# Patient Record
Sex: Male | Born: 1975 | Race: White | Hispanic: No | Marital: Single | Smoking: Former smoker
Health system: Southern US, Community
[De-identification: ages and names within clinical notes are randomized; demographics above are authoritative.]

## PROBLEM LIST (undated history)

## (undated) DIAGNOSIS — F909 Attention-deficit hyperactivity disorder, unspecified type: Secondary | ICD-10-CM

## (undated) DIAGNOSIS — F209 Schizophrenia, unspecified: Secondary | ICD-10-CM

## (undated) DIAGNOSIS — F319 Bipolar disorder, unspecified: Secondary | ICD-10-CM

---

## 1998-10-15 ENCOUNTER — Encounter: Payer: Self-pay | Admitting: Emergency Medicine

## 1998-10-15 ENCOUNTER — Emergency Department (HOSPITAL_COMMUNITY): Admission: EM | Admit: 1998-10-15 | Discharge: 1998-10-15 | Payer: Self-pay | Admitting: Emergency Medicine

## 1999-06-18 ENCOUNTER — Encounter: Payer: Self-pay | Admitting: Emergency Medicine

## 1999-06-18 ENCOUNTER — Emergency Department (HOSPITAL_COMMUNITY): Admission: EM | Admit: 1999-06-18 | Discharge: 1999-06-18 | Payer: Self-pay | Admitting: Emergency Medicine

## 1999-09-11 ENCOUNTER — Encounter: Payer: Self-pay | Admitting: Emergency Medicine

## 1999-09-11 ENCOUNTER — Emergency Department (HOSPITAL_COMMUNITY): Admission: EM | Admit: 1999-09-11 | Discharge: 1999-09-11 | Payer: Self-pay | Admitting: Emergency Medicine

## 1999-10-28 ENCOUNTER — Emergency Department (HOSPITAL_COMMUNITY): Admission: EM | Admit: 1999-10-28 | Discharge: 1999-10-28 | Payer: Self-pay | Admitting: Internal Medicine

## 2002-03-24 ENCOUNTER — Emergency Department (HOSPITAL_COMMUNITY): Admission: EM | Admit: 2002-03-24 | Discharge: 2002-03-24 | Payer: Self-pay | Admitting: Emergency Medicine

## 2002-07-05 ENCOUNTER — Emergency Department (HOSPITAL_COMMUNITY): Admission: EM | Admit: 2002-07-05 | Discharge: 2002-07-05 | Payer: Self-pay | Admitting: *Deleted

## 2002-09-02 ENCOUNTER — Emergency Department (HOSPITAL_COMMUNITY): Admission: EM | Admit: 2002-09-02 | Discharge: 2002-09-02 | Payer: Self-pay | Admitting: Emergency Medicine

## 2002-11-19 ENCOUNTER — Encounter: Payer: Self-pay | Admitting: General Surgery

## 2002-11-19 ENCOUNTER — Encounter: Payer: Self-pay | Admitting: Emergency Medicine

## 2002-11-20 ENCOUNTER — Inpatient Hospital Stay (HOSPITAL_COMMUNITY): Admission: AC | Admit: 2002-11-20 | Discharge: 2002-11-24 | Payer: Self-pay

## 2002-11-23 ENCOUNTER — Encounter: Payer: Self-pay | Admitting: General Surgery

## 2002-11-24 ENCOUNTER — Encounter: Payer: Self-pay | Admitting: Orthopedic Surgery

## 2002-11-27 ENCOUNTER — Emergency Department (HOSPITAL_COMMUNITY): Admission: EM | Admit: 2002-11-27 | Discharge: 2002-11-27 | Payer: Self-pay | Admitting: Emergency Medicine

## 2002-12-05 ENCOUNTER — Emergency Department (HOSPITAL_COMMUNITY): Admission: EM | Admit: 2002-12-05 | Discharge: 2002-12-05 | Payer: Self-pay | Admitting: Emergency Medicine

## 2004-03-07 ENCOUNTER — Ambulatory Visit (HOSPITAL_COMMUNITY): Admission: RE | Admit: 2004-03-07 | Discharge: 2004-03-07 | Payer: Self-pay | Admitting: Orthopedic Surgery

## 2005-06-14 ENCOUNTER — Emergency Department (HOSPITAL_COMMUNITY): Admission: EM | Admit: 2005-06-14 | Discharge: 2005-06-14 | Payer: Self-pay | Admitting: Emergency Medicine

## 2005-11-25 ENCOUNTER — Emergency Department (HOSPITAL_COMMUNITY): Admission: EM | Admit: 2005-11-25 | Discharge: 2005-11-25 | Payer: Self-pay | Admitting: Emergency Medicine

## 2011-10-01 ENCOUNTER — Emergency Department (HOSPITAL_COMMUNITY): Payer: Self-pay

## 2011-10-01 ENCOUNTER — Emergency Department (HOSPITAL_COMMUNITY)
Admission: EM | Admit: 2011-10-01 | Discharge: 2011-10-01 | Disposition: A | Discharge status: Home / Self Care | Payer: Self-pay | Attending: Emergency Medicine | Admitting: Emergency Medicine

## 2011-10-01 DIAGNOSIS — M25539 Pain in unspecified wrist: Secondary | ICD-10-CM | POA: Insufficient documentation

## 2011-10-01 DIAGNOSIS — S51809A Unspecified open wound of unspecified forearm, initial encounter: Secondary | ICD-10-CM | POA: Insufficient documentation

## 2011-10-01 DIAGNOSIS — S63509A Unspecified sprain of unspecified wrist, initial encounter: Secondary | ICD-10-CM | POA: Insufficient documentation

## 2011-10-01 DIAGNOSIS — S0003XA Contusion of scalp, initial encounter: Secondary | ICD-10-CM | POA: Insufficient documentation

## 2011-10-01 DIAGNOSIS — IMO0002 Reserved for concepts with insufficient information to code with codable children: Secondary | ICD-10-CM | POA: Insufficient documentation

## 2012-10-15 ENCOUNTER — Emergency Department (HOSPITAL_COMMUNITY)
Admission: EM | Admit: 2012-10-15 | Discharge: 2012-10-16 | Disposition: A | Discharge status: Home / Self Care | Payer: Self-pay | Attending: Emergency Medicine | Admitting: Emergency Medicine

## 2012-10-15 ENCOUNTER — Encounter (HOSPITAL_COMMUNITY): Payer: Self-pay | Admitting: *Deleted

## 2012-10-15 ENCOUNTER — Emergency Department (HOSPITAL_COMMUNITY): Payer: Self-pay

## 2012-10-15 DIAGNOSIS — R51 Headache: Secondary | ICD-10-CM | POA: Insufficient documentation

## 2012-10-15 DIAGNOSIS — R5381 Other malaise: Secondary | ICD-10-CM | POA: Insufficient documentation

## 2012-10-15 DIAGNOSIS — R5383 Other fatigue: Secondary | ICD-10-CM | POA: Insufficient documentation

## 2012-10-15 DIAGNOSIS — F17200 Nicotine dependence, unspecified, uncomplicated: Secondary | ICD-10-CM | POA: Insufficient documentation

## 2012-10-15 DIAGNOSIS — R079 Chest pain, unspecified: Secondary | ICD-10-CM | POA: Insufficient documentation

## 2012-10-15 DIAGNOSIS — R0602 Shortness of breath: Secondary | ICD-10-CM | POA: Insufficient documentation

## 2012-10-15 MED ORDER — IBUPROFEN 800 MG PO TABS
800.0000 mg | ORAL_TABLET | Freq: Once | ORAL | Status: DC
  Filled 2012-10-15: qty 1

## 2012-10-15 NOTE — ED Notes (Signed)
Pt states he no longer has a HA and does not want ibuprofen. He is also questioning the blood draw and wants to speak with the MD. Stated that we are "not checking him for mold like he asked." Will notify MD.

## 2012-10-15 NOTE — ED Notes (Signed)
Bedside report received from previous RN. Pt resting quietly. Will continue to monitor. 

## 2012-10-15 NOTE — ED Provider Notes (Signed)
History     CSN: [id number]  Arrival date & time 10/15/12  1478   First MD Initiated Contact with Patient 10/15/12 2135      Chief Complaint  Patient presents with  . Headache   HPI  History provided by the patient. Patient is a 36 year old male with no significant PMH who presents with multiple complaints of headache, generalized fatigue and concerns for mold and fungus exposure. Patient states that his current house has mold throughout the house and has been tested for this. He states that he has been feeling increased fatigue for several months with waxing and waning headaches. Patient has been becoming concerned of possible old infection. He also complains of some occasional shortness of breath and pain with inspirations. He denies any cough, fever, chills or sweats. Patient denies any abdominal pain or discomfort. Denies any nausea vomiting or diarrhea. Denies any urinary complaints. He denies any rhinorrhea or nasal congestion. Symptoms have been present for the past 2 years.    History reviewed. No pertinent past medical history.  History reviewed. No pertinent past surgical history.  History reviewed. No pertinent family history.  History  Substance Use Topics  . Smoking status: Current Some Day Smoker  . Smokeless tobacco: Not on file  . Alcohol Use: Yes      Review of Systems  Constitutional: Positive for fatigue. Negative for fever, chills, appetite change and unexpected weight change.  Respiratory: Positive for shortness of breath. Negative for cough and wheezing.   Cardiovascular: Positive for chest pain.  Gastrointestinal: Negative for nausea, vomiting, abdominal pain and diarrhea.  Genitourinary: Negative for dysuria, frequency, hematuria and flank pain.  Neurological: Positive for headaches.    Allergies  Review of patient's allergies indicates not on file.  Home Medications  No current outpatient prescriptions on file.  BP 125/77  Pulse 59  Temp 98.5  F (36.9 C) (Oral)  Resp 20  SpO2 99%  Physical Exam  Nursing note and vitals reviewed. Constitutional: He is oriented to person, place, and time. He appears well-developed and well-nourished. No distress.  HENT:  Head: Normocephalic.  Mouth/Throat: Oropharynx is clear and moist.  Neck: Normal range of motion. Neck supple.       No meningeal signs  Cardiovascular: Normal rate and regular rhythm.   Pulmonary/Chest: Effort normal and breath sounds normal. No respiratory distress. He has no wheezes. He has no rales.  Abdominal: Soft.  Musculoskeletal: He exhibits no edema.  Lymphadenopathy:    He has no cervical adenopathy.  Neurological: He is alert and oriented to person, place, and time.  Skin: Skin is warm. No rash noted. No erythema.  Psychiatric: He has a normal mood and affect. His behavior is normal.    ED Course  Procedures   Results for orders placed during the hospital encounter of 10/15/12  CBC WITH DIFFERENTIAL      Component Value Range   WBC 8.4  4.0 - 10.5 K/uL   RBC 5.07  4.22 - 5.81 MIL/uL   Hemoglobin 14.2  13.0 - 17.0 g/dL   HCT 29.5  62.1 - 30.8 %   MCV 83.4  78.0 - 100.0 fL   MCH 28.0  26.0 - 34.0 pg   MCHC 33.6  30.0 - 36.0 g/dL   RDW 65.7  84.6 - 96.2 %   Platelets 175  150 - 400 K/uL   Neutrophils Relative 51  43 - 77 %   Neutro Abs 4.3  1.7 - 7.7 K/uL  Lymphocytes Relative 39  12 - 46 %   Lymphs Abs 3.3  0.7 - 4.0 K/uL   Monocytes Relative 8  3 - 12 %   Monocytes Absolute 0.7  0.1 - 1.0 K/uL   Eosinophils Relative 2  0 - 5 %   Eosinophils Absolute 0.2  0.0 - 0.7 K/uL   Basophils Relative 1  0 - 1 %   Basophils Absolute 0.0  0.0 - 0.1 K/uL      Dg Chest 2 View  10/15/2012  *RADIOLOGY REPORT*  Clinical Data: Cough, shortness of breath.  CHEST - 2 VIEW  Comparison: None.  Findings: Cardiomediastinal silhouette appears normal.  No acute pulmonary disease is noted.  Bony thorax is intact.  IMPRESSION: No acute cardiopulmonary abnormality seen.    Original Report Authenticated By: Lupita Raider.,  M.D.      1. Headache       MDM  10:50 PM patient seen and evaluated. Patient appears calm and in no acute distress.   Patient is well-appearing with normal vital signs and normal temperature. His chest x-ray does not show any concerning findings of infection. His blood tests are also normal today. At this time patient does not appear to have any emergent condition and will be discharged home to followup with the primary care provider.     Angus Seller, [place] 10/17/12 [phone]

## 2012-10-15 NOTE — ED Notes (Signed)
Patient is alert and oriented x3.  He is here for chronic issues that he states that he has been dealing with. He has possible been exposed to mold spores in his house.  His S/S are vision problems, headaches,  Tired, fatigue.  He also adds he does have a sharp pain when he takes a deep breath but no other complaints.

## 2012-10-15 NOTE — ED Notes (Signed)
Pt is c/o headache, blurred vision, "dizzy spells", fatigue. Pt sts he had this symptoms for about 2 years now. Pt also sts his house was tested for mold and "air tested positive for mold spores". Pt sts "I just want you to test my blood for colonies, because I read on the internet that mold spores can colonize in your body". Pt also c/o memory impairment.

## 2012-10-16 LAB — CBC WITH DIFFERENTIAL/PLATELET
Eosinophils Absolute: 0.2 10*3/uL (ref 0.0–0.7)
Hemoglobin: 14.2 g/dL (ref 13.0–17.0)
Lymphocytes Relative: 39 % (ref 12–46)
Lymphs Abs: 3.3 10*3/uL (ref 0.7–4.0)
MCH: 28 pg (ref 26.0–34.0)
Monocytes Relative: 8 % (ref 3–12)
Neutro Abs: 4.3 10*3/uL (ref 1.7–7.7)
Neutrophils Relative %: 51 % (ref 43–77)
Platelets: 175 10*3/uL (ref 150–400)
RBC: 5.07 MIL/uL (ref 4.22–5.81)
WBC: 8.4 10*3/uL (ref 4.0–10.5)

## 2012-10-16 NOTE — ED Notes (Signed)
Pt spoke with MD and has agreed to do the blood test.

## 2012-10-16 NOTE — ED Notes (Signed)
Spoke with MD and went back to explain the necessity of the basic CBC test before running a more expensive fungal panel. The pt is focused on cost since he does not have insurance and believes that the CBC is a waste of money since he knows he is sick because of the mold in his house. Pt is refusing the CBC.

## 2012-10-17 NOTE — ED Provider Notes (Signed)
Medical screening examination/treatment/procedure(s) were performed by non-physician practitioner and as supervising physician I was immediately available for consultation/collaboration.   Mai Longnecker M Beckett Maden, DO 10/17/12 2019 

## 2015-11-01 ENCOUNTER — Emergency Department (HOSPITAL_COMMUNITY): Payer: Self-pay

## 2015-11-01 ENCOUNTER — Emergency Department (HOSPITAL_COMMUNITY)
Admission: EM | Admit: 2015-11-01 | Discharge: 2015-11-03 | Disposition: A | Discharge status: Other Acute Inpatient Hospital | Payer: Self-pay | Attending: Emergency Medicine | Admitting: Emergency Medicine

## 2015-11-01 ENCOUNTER — Encounter (HOSPITAL_COMMUNITY): Payer: Self-pay

## 2015-11-01 ENCOUNTER — Encounter (HOSPITAL_COMMUNITY): Payer: Self-pay | Admitting: Emergency Medicine

## 2015-11-01 DIAGNOSIS — Z79899 Other long term (current) drug therapy: Secondary | ICD-10-CM | POA: Insufficient documentation

## 2015-11-01 DIAGNOSIS — F29 Unspecified psychosis not due to a substance or known physiological condition: Secondary | ICD-10-CM | POA: Diagnosis present

## 2015-11-01 DIAGNOSIS — Y9289 Other specified places as the place of occurrence of the external cause: Secondary | ICD-10-CM | POA: Insufficient documentation

## 2015-11-01 DIAGNOSIS — Y9389 Activity, other specified: Secondary | ICD-10-CM | POA: Insufficient documentation

## 2015-11-01 DIAGNOSIS — F22 Delusional disorders: Secondary | ICD-10-CM | POA: Insufficient documentation

## 2015-11-01 DIAGNOSIS — S0012XA Contusion of left eyelid and periocular area, initial encounter: Secondary | ICD-10-CM | POA: Insufficient documentation

## 2015-11-01 DIAGNOSIS — F17200 Nicotine dependence, unspecified, uncomplicated: Secondary | ICD-10-CM | POA: Insufficient documentation

## 2015-11-01 DIAGNOSIS — X58XXXA Exposure to other specified factors, initial encounter: Secondary | ICD-10-CM | POA: Insufficient documentation

## 2015-11-01 DIAGNOSIS — S0011XA Contusion of right eyelid and periocular area, initial encounter: Secondary | ICD-10-CM | POA: Insufficient documentation

## 2015-11-01 DIAGNOSIS — F1510 Other stimulant abuse, uncomplicated: Secondary | ICD-10-CM | POA: Insufficient documentation

## 2015-11-01 DIAGNOSIS — Y998 Other external cause status: Secondary | ICD-10-CM | POA: Insufficient documentation

## 2015-11-01 DIAGNOSIS — R4182 Altered mental status, unspecified: Secondary | ICD-10-CM | POA: Insufficient documentation

## 2015-11-01 LAB — COMPREHENSIVE METABOLIC PANEL
ALBUMIN: 4.6 g/dL (ref 3.5–5.0)
ALT: 23 U/L (ref 17–63)
AST: 18 U/L (ref 15–41)
Alkaline Phosphatase: 68 U/L (ref 38–126)
Anion gap: 7 (ref 5–15)
BILIRUBIN TOTAL: 0.5 mg/dL (ref 0.3–1.2)
BUN: 16 mg/dL (ref 6–20)
CHLORIDE: 107 mmol/L (ref 101–111)
CO2: 23 mmol/L (ref 22–32)
CREATININE: 0.64 mg/dL (ref 0.61–1.24)
Calcium: 9.1 mg/dL (ref 8.9–10.3)
GFR calc Af Amer: >60 mL/min (ref >60)
GLUCOSE: 101 mg/dL — AB (ref 65–99)
POTASSIUM: 4.2 mmol/L (ref 3.5–5.1)
SODIUM: 137 mmol/L (ref 135–145)
TOTAL PROTEIN: 7.8 g/dL (ref 6.5–8.1)

## 2015-11-01 LAB — CBC
HEMATOCRIT: 44.4 % (ref 39.0–52.0)
Hemoglobin: 15.2 g/dL (ref 13.0–17.0)
MCH: 28.3 pg (ref 26.0–34.0)
MCHC: 34.2 g/dL (ref 30.0–36.0)
MCV: 82.7 fL (ref 78.0–100.0)
Platelets: 199 10*3/uL (ref 150–400)
RBC: 5.37 MIL/uL (ref 4.22–5.81)
RDW: 13.4 % (ref 11.5–15.5)
WBC: 8.6 10*3/uL (ref 4.0–10.5)

## 2015-11-01 LAB — RAPID URINE DRUG SCREEN, HOSP PERFORMED
AMPHETAMINES: POSITIVE — AB
BARBITURATES: NOT DETECTED
BENZODIAZEPINES: NOT DETECTED
Cocaine: NOT DETECTED
Opiates: NOT DETECTED
Tetrahydrocannabinol: NOT DETECTED

## 2015-11-01 LAB — ACETAMINOPHEN LEVEL: Acetaminophen (Tylenol), Serum: <10 ug/mL — ABNORMAL LOW (ref 10–30)

## 2015-11-01 LAB — SALICYLATE LEVEL: Salicylate Lvl: <4 mg/dL (ref 2.8–30.0)

## 2015-11-01 LAB — ETHANOL: Alcohol, Ethyl (B): <5 mg/dL (ref <5)

## 2015-11-01 MED ORDER — RISPERIDONE 2 MG PO TBDP
2.0000 mg | ORAL_TABLET | Freq: Every day | ORAL | Status: DC
Start: 2015-11-01 — End: 2015-11-03
  Administered 2015-11-01 – 2015-11-02 (×2): 2 mg via ORAL
  Filled 2015-11-01 (×3): qty 1

## 2015-11-01 MED ORDER — ZIPRASIDONE MESYLATE 20 MG IM SOLR: 10.0000 mg | Freq: Three times a day (TID) | INTRAMUSCULAR | Status: DC | PRN

## 2015-11-01 MED ORDER — STERILE WATER FOR INJECTION IJ SOLN
INTRAMUSCULAR | Status: AC
  Administered 2015-11-01: 1.2 mL
  Filled 2015-11-01: qty 10

## 2015-11-01 MED ORDER — ZIPRASIDONE MESYLATE 20 MG IM SOLR
INTRAMUSCULAR | Status: AC
  Administered 2015-11-01: 10 mg via INTRAMUSCULAR
  Filled 2015-11-01: qty 20

## 2015-11-01 MED ORDER — LORAZEPAM 1 MG PO TABS
2.0000 mg | ORAL_TABLET | Freq: Once | ORAL | Status: AC
  Administered 2015-11-01: 2 mg via ORAL
  Filled 2015-11-01: qty 2

## 2015-11-01 MED ORDER — FINASTERIDE 5 MG PO TABS
5.0000 mg | ORAL_TABLET | Freq: Every day | ORAL | Status: DC
Start: 2015-11-01 — End: 2015-11-03
  Filled 2015-11-01 (×3): qty 1

## 2015-11-01 MED ORDER — ZIPRASIDONE MESYLATE 20 MG IM SOLR
10.0000 mg | Freq: Once | INTRAMUSCULAR | Status: AC
  Administered 2015-11-01: 10 mg via INTRAMUSCULAR

## 2015-11-01 MED ORDER — ZIPRASIDONE MESYLATE 20 MG IM SOLR: 10.0000 mg | Freq: Once | INTRAMUSCULAR | Status: DC

## 2015-11-01 NOTE — ED Notes (Signed)
A man called, identifying himself as the patients father. He states the patient has a history of schizophrenia. The man demanded to be given information about the patient and to speak to the physician involved in his care. I advised I can not disclose information about the patient over the phone due to violation of HIPPA. The man then states, "I will just have to come up there and sit all day then." I advised the patient's father he is welcome to come to ER and I will relay his request to speak with the provider to the oncoming staff.

## 2015-11-01 NOTE — ED Notes (Signed)
[facility] Expected date:  Expected time:  Means of arrival:  Comments: Police, psyche

## 2015-11-01 NOTE — ED Notes (Signed)
Patient arrives with [employer], patient is under IVC. Patient is IVC by his mother. Per IVC papers patient is hearing voices. Patient has bilateral black eyes that are self inflicted and has been attempting to break his own jaw. Patient screaming and cursing loudly in hallway. Patient was moved to room 23, order for Geodon obtained.

## 2015-11-01 NOTE — ED Notes (Signed)
Pt made aware that his parents are here by this Consulting civil engineerCharge RN.  Pt is refusing visitors at this time.  Parents made aware by this Consulting civil engineerCharge RN.  Pt's father sts "I need to talk to the doctor that is seeing him.  My son is very smart and manipulative."  This Consulting civil engineerCharge RN informed them that I would let the provider know that they are in the lobby, if they need further information.

## 2015-11-01 NOTE — ED Notes (Signed)
Lab at bedside

## 2015-11-01 NOTE — ED Notes (Signed)
Dr. Estell HarpinZammit speaking with pt's parents at this time.

## 2015-11-01 NOTE — ED Notes (Signed)
[name], pt's father, would like to speak to counselor/doctor for assessment. Phone # [phone]

## 2015-11-01 NOTE — ED Notes (Signed)
Brad Barr is restless, anxious and agitated. He is yelling out periodically with no one in his room but himself, he has broken the  door off of the safety /medical equipment cabinet in his room. He then denies breaking it and states it came off track. He denies AVH. He is refusing to be given IM geodon as ordered for agitation. Agreeable to PO alternative instead. He states " you are not putting a needle in my ass, that's not gonna happen! I will take a pill but not a needle". On call provider notified.

## 2015-11-01 NOTE — ED Provider Notes (Signed)
CSN: [id number]     Arrival date & time 11/01/15  1046 History   First MD Initiated Contact with Patient 11/01/15 1123     Chief Complaint  Patient presents with  . IVC   . Hallucinations     (Consider location/radiation/quality/duration/timing/severity/associated sxs/prior Treatment) Patient is a 39 y.o. male presenting with altered mental status. The history is provided by a relative (Patient was committed by family because he was having delusions that the [employer] was using [employer] to get to him. His family stated that he destroyed his house.).  Altered Mental Status Presenting symptoms: behavior changes   Severity:  Severe Most recent episode:  More than 2 days ago Episode history:  Multiple Timing:  Constant Progression:  Worsening Chronicity:  New Context: not alcohol use     History reviewed. No pertinent past medical history. History reviewed. No pertinent past surgical history. History reviewed. No pertinent family history. Social History  Substance Use Topics  . Smoking status: Current Some Day Smoker  . Smokeless tobacco: None  . Alcohol Use: Yes    Review of Systems  Unable to perform ROS: Mental status change      Allergies  Review of patient's allergies indicates no known allergies.  Home Medications   Prior to Admission medications   Medication Sig Start Date End Date Taking? Authorizing Provider  amphetamine-dextroamphetamine (ADDERALL) 30 MG tablet Take 30 mg by mouth 3 (three) times daily.   Yes Historical Provider, MD  finasteride (PROPECIA) 1 MG tablet Take 0.25 mg by mouth daily.   Yes Historical Provider, MD  ibuprofen (ADVIL,MOTRIN) 200 MG tablet Take 200 mg by mouth every 6 (six) hours as needed.   Yes Historical Provider, MD  finasteride (PROSCAR) 5 MG tablet Take 5 mg by mouth daily.    Historical Provider, MD   BP 121/86 mmHg  Pulse 63  Temp(Src) 97.8 F (36.6 C) (Oral)  Resp 20  SpO2 97% Physical Exam  Constitutional: He is oriented  to person, place, and time. He appears well-developed.  HENT:  Head: Normocephalic.  Eyes: Conjunctivae and EOM are normal. No scleral icterus.  Neck: Neck supple. No thyromegaly present.  Cardiovascular: Normal rate and regular rhythm.  Exam reveals no gallop and no friction rub.   No murmur heard. Pulmonary/Chest: No stridor. He has no wheezes. He has no rales. He exhibits no tenderness.  Abdominal: He exhibits no distension. There is no tenderness. There is no rebound.  Musculoskeletal: Normal range of motion. He exhibits no edema.  Lymphadenopathy:    He has no cervical adenopathy.  Neurological: He is oriented to person, place, and time. He exhibits normal muscle tone. Coordination normal.  Skin: No rash noted. No erythema.  Psychiatric:  Patient having hallucinations auditory hallucinations and delusional thoughts [facility]    ED Course  Procedures (including critical care time) Labs Review Labs Reviewed  COMPREHENSIVE METABOLIC PANEL - Abnormal; Notable for the following:    Glucose, Bld 101 (*)    All other components within normal limits  ACETAMINOPHEN LEVEL - Abnormal; Notable for the following:    Acetaminophen (Tylenol), Serum <10 (*)    All other components within normal limits  URINE RAPID DRUG SCREEN, HOSP PERFORMED - Abnormal; Notable for the following:    Amphetamines POSITIVE (*)    All other components within normal limits  ETHANOL  SALICYLATE LEVEL  CBC    Imaging Review Ct Head Wo Contrast  11/01/2015  CLINICAL DATA:  Self-inflicted injury with bilateral ecchymoses  surrounding the eyes EXAM: CT HEAD WITHOUT CONTRAST CT MAXILLOFACIAL WITHOUT CONTRAST CT CERVICAL SPINE WITHOUT CONTRAST TECHNIQUE: Multidetector CT imaging of the head, cervical spine, and maxillofacial structures were performed using the standard protocol without intravenous contrast. Multiplanar CT image reconstructions of the cervical spine and maxillofacial structures were also  generated. COMPARISON:  None. FINDINGS: CT HEAD FINDINGS Bony calvarium is intact. No gross soft tissue abnormality is noted. No findings to suggest acute hemorrhage, acute infarction or space-occupying mass lesion are noted. CT MAXILLOFACIAL FINDINGS Bony structures are within normal limits. No acute fracture is seen. Mild soft tissue swelling is noted about the size consistent with the recent injury. The orbits and their contents are within normal limits. A prior nasal bone fracture is noted with incomplete healing. CT CERVICAL SPINE FINDINGS Seven cervical segments are well visualized. Mild osteophytic changes are noted from C2 to C6. No acute fracture or acute facet abnormality is noted. The surrounding soft tissues are within normal limits. The odontoid is unremarkable. IMPRESSION: CT of the head:  No acute abnormality noted. CT of the maxillofacial bones: No acute fracture is noted. A chronic nasal bone fracture is seen. CT of the cervical spine: Degenerative change without acute abnormality. Electronically Signed   By: Alcide Clever M.D.   On: 11/01/2015 12:50   Ct Cervical Spine Wo Contrast  11/01/2015  CLINICAL DATA:  Self-inflicted injury with bilateral ecchymoses surrounding the eyes EXAM: CT HEAD WITHOUT CONTRAST CT MAXILLOFACIAL WITHOUT CONTRAST CT CERVICAL SPINE WITHOUT CONTRAST TECHNIQUE: Multidetector CT imaging of the head, cervical spine, and maxillofacial structures were performed using the standard protocol without intravenous contrast. Multiplanar CT image reconstructions of the cervical spine and maxillofacial structures were also generated. COMPARISON:  None. FINDINGS: CT HEAD FINDINGS Bony calvarium is intact. No gross soft tissue abnormality is noted. No findings to suggest acute hemorrhage, acute infarction or space-occupying mass lesion are noted. CT MAXILLOFACIAL FINDINGS Bony structures are within normal limits. No acute fracture is seen. Mild soft tissue swelling is noted about the  size consistent with the recent injury. The orbits and their contents are within normal limits. A prior nasal bone fracture is noted with incomplete healing. CT CERVICAL SPINE FINDINGS Seven cervical segments are well visualized. Mild osteophytic changes are noted from C2 to C6. No acute fracture or acute facet abnormality is noted. The surrounding soft tissues are within normal limits. The odontoid is unremarkable. IMPRESSION: CT of the head:  No acute abnormality noted. CT of the maxillofacial bones: No acute fracture is noted. A chronic nasal bone fracture is seen. CT of the cervical spine: Degenerative change without acute abnormality. Electronically Signed   By: Alcide Clever M.D.   On: 11/01/2015 12:50   Ct Maxillofacial Wo Cm  11/01/2015  CLINICAL DATA:  Self-inflicted injury with bilateral ecchymoses surrounding the eyes EXAM: CT HEAD WITHOUT CONTRAST CT MAXILLOFACIAL WITHOUT CONTRAST CT CERVICAL SPINE WITHOUT CONTRAST TECHNIQUE: Multidetector CT imaging of the head, cervical spine, and maxillofacial structures were performed using the standard protocol without intravenous contrast. Multiplanar CT image reconstructions of the cervical spine and maxillofacial structures were also generated. COMPARISON:  None. FINDINGS: CT HEAD FINDINGS Bony calvarium is intact. No gross soft tissue abnormality is noted. No findings to suggest acute hemorrhage, acute infarction or space-occupying mass lesion are noted. CT MAXILLOFACIAL FINDINGS Bony structures are within normal limits. No acute fracture is seen. Mild soft tissue swelling is noted about the size consistent with the recent injury. The orbits and their contents are within  normal limits. A prior nasal bone fracture is noted with incomplete healing. CT CERVICAL SPINE FINDINGS Seven cervical segments are well visualized. Mild osteophytic changes are noted from C2 to C6. No acute fracture or acute facet abnormality is noted. The surrounding soft tissues are within  normal limits. The odontoid is unremarkable. IMPRESSION: CT of the head:  No acute abnormality noted. CT of the maxillofacial bones: No acute fracture is noted. A chronic nasal bone fracture is seen. CT of the cervical spine: Degenerative change without acute abnormality. Electronically Signed   By: Alcide Clever M.D.   On: 11/01/2015 12:50   I have personally reviewed and evaluated these images and lab results as part of my medical decision-making.   EKG Interpretation None      MDM   Final diagnoses:  None    [facility]    [name], MD 11/01/15 [phone]
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
## 2015-11-01 NOTE — ED Notes (Signed)
Patient not willing to speak to staff at this time to complete assessment.

## 2015-11-01 NOTE — ED Notes (Signed)
Brad Barr is resting in his room. He denies pain at this time. He is not receptive to answering most of my assessment questions. His answer to my questions is "I am trying to sleep". Informed patient that I was his night RN and I am available to him for any questions, concerns or medical needs tonight. He responds "ok".

## 2015-11-01 NOTE — BH Assessment (Signed)
Per Dr. Almyra FreeAkintayo & Conrad, DNP patient meets criteria for a inpatient admission. [facility] No beds at this time. Patient to be referred to outside facilities. Patient Under Review: [facility], [facility], Brad Barr, Barr, [facility], [facility]

## 2015-11-02 DIAGNOSIS — F29 Unspecified psychosis not due to a substance or known physiological condition: Secondary | ICD-10-CM

## 2015-11-02 MED ORDER — LORAZEPAM 1 MG PO TABS: 1.0000 mg | ORAL_TABLET | Freq: Three times a day (TID) | ORAL | Status: DC | PRN

## 2015-11-02 MED ORDER — DIPHENHYDRAMINE HCL 25 MG PO CAPS
50.0000 mg | ORAL_CAPSULE | Freq: Three times a day (TID) | ORAL | Status: DC | PRN
  Administered 2015-11-02: 50 mg via ORAL
  Filled 2015-11-02: qty 2

## 2015-11-02 MED ORDER — HYDROXYZINE HCL 25 MG PO TABS: 50.0000 mg | ORAL_TABLET | Freq: Three times a day (TID) | ORAL | Status: DC | PRN

## 2015-11-02 MED ORDER — HYDROCORTISONE 1 % EX CREA
TOPICAL_CREAM | Freq: Three times a day (TID) | CUTANEOUS | Status: DC
  Administered 2015-11-02 – 2015-11-03 (×2): via TOPICAL
  Filled 2015-11-02: qty 28

## 2015-11-02 MED ORDER — TRAZODONE HCL 50 MG PO TABS
50.0000 mg | ORAL_TABLET | Freq: Every day | ORAL | Status: DC
  Administered 2015-11-02: 50 mg via ORAL
  Filled 2015-11-02: qty 1

## 2015-11-02 NOTE — ED Notes (Signed)
Pt refused vitals 

## 2015-11-02 NOTE — ED Notes (Signed)
F/U attempt to track vascular US order.

## 2015-11-02 NOTE — ED Notes (Signed)
[name] asked slightly irritable when approached but warmed slightly during h.s med administration. He refused vitals. He was in NAD. He denied SI/HI/AVH and other needs. Took meds without difficulty or issue. Urged him to Animal nutritionistapproach writer with needs/concerns. Will continue to monitor for needs/safety.
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
## 2015-11-02 NOTE — ED Notes (Signed)
EDP contacted to assess rash on forearms.

## 2015-11-02 NOTE — ED Notes (Signed)
Attempted conversation with patient.  Responds "I'm ok".  Food/fluids offered.  No physical complaints.  Unable to complete psych assessment.

## 2015-11-03 ENCOUNTER — Emergency Department (HOSPITAL_COMMUNITY): Payer: Self-pay

## 2015-11-03 ENCOUNTER — Inpatient Hospital Stay (HOSPITAL_COMMUNITY)
Admission: AD | Admit: 2015-11-03 | Discharge: 2015-11-08 | DRG: 897 | Disposition: A | Discharge status: Home / Self Care | Payer: Federal, State, Local not specified - Other | Source: Intra-hospital | Attending: Psychiatry | Admitting: Psychiatry

## 2015-11-03 ENCOUNTER — Encounter (HOSPITAL_COMMUNITY): Payer: Self-pay

## 2015-11-03 DIAGNOSIS — F17200 Nicotine dependence, unspecified, uncomplicated: Secondary | ICD-10-CM | POA: Diagnosis present

## 2015-11-03 DIAGNOSIS — F900 Attention-deficit hyperactivity disorder, predominantly inattentive type: Secondary | ICD-10-CM | POA: Diagnosis not present

## 2015-11-03 DIAGNOSIS — F1310 Sedative, hypnotic or anxiolytic abuse, uncomplicated: Secondary | ICD-10-CM | POA: Diagnosis present

## 2015-11-03 DIAGNOSIS — F19950 Other psychoactive substance use, unspecified with psychoactive substance-induced psychotic disorder with delusions: Secondary | ICD-10-CM | POA: Diagnosis not present

## 2015-11-03 DIAGNOSIS — F1590 Other stimulant use, unspecified, uncomplicated: Secondary | ICD-10-CM | POA: Diagnosis not present

## 2015-11-03 DIAGNOSIS — F909 Attention-deficit hyperactivity disorder, unspecified type: Secondary | ICD-10-CM | POA: Diagnosis present

## 2015-11-03 DIAGNOSIS — F411 Generalized anxiety disorder: Secondary | ICD-10-CM | POA: Diagnosis present

## 2015-11-03 DIAGNOSIS — F29 Unspecified psychosis not due to a substance or known physiological condition: Secondary | ICD-10-CM | POA: Diagnosis present

## 2015-11-03 MED ORDER — HYDROXYZINE HCL 50 MG PO TABS
50.0000 mg | ORAL_TABLET | Freq: Three times a day (TID) | ORAL | Status: DC | PRN
  Administered 2015-11-03 – 2015-11-04 (×2): 50 mg via ORAL
  Filled 2015-11-03 (×2): qty 1

## 2015-11-03 MED ORDER — HYDROCORTISONE 1 % EX CREA
TOPICAL_CREAM | Freq: Three times a day (TID) | CUTANEOUS | Status: DC
  Administered 2015-11-03 – 2015-11-04 (×3): via TOPICAL
  Administered 2015-11-04: 1 via TOPICAL
  Filled 2015-11-03 (×2): qty 28

## 2015-11-03 MED ORDER — ACETAMINOPHEN 325 MG PO TABS
650.0000 mg | ORAL_TABLET | Freq: Four times a day (QID) | ORAL | Status: DC | PRN
  Administered 2015-11-05 – 2015-11-06 (×2): 650 mg via ORAL
  Filled 2015-11-03 (×2): qty 2

## 2015-11-03 MED ORDER — DIPHENHYDRAMINE HCL 25 MG PO CAPS: 50.0000 mg | ORAL_CAPSULE | Freq: Three times a day (TID) | ORAL | Status: DC | PRN

## 2015-11-03 MED ORDER — BACITRACIN-NEOMYCIN-POLYMYXIN OINTMENT TUBE
TOPICAL_OINTMENT | Freq: Two times a day (BID) | CUTANEOUS | Status: DC
  Administered 2015-11-03 – 2015-11-04 (×3): via TOPICAL
  Filled 2015-11-03: qty 1
  Filled 2015-11-03: qty 15

## 2015-11-03 MED ORDER — LORAZEPAM 1 MG PO TABS
1.0000 mg | ORAL_TABLET | Freq: Three times a day (TID) | ORAL | Status: DC | PRN
  Administered 2015-11-05 – 2015-11-06 (×2): 1 mg via ORAL
  Filled 2015-11-03: qty 1
  Filled 2015-11-03: qty 2

## 2015-11-03 MED ORDER — MAGNESIUM HYDROXIDE 400 MG/5ML PO SUSP: 30.0000 mL | Freq: Every day | ORAL | Status: DC | PRN

## 2015-11-03 MED ORDER — ALUM & MAG HYDROXIDE-SIMETH 200-200-20 MG/5ML PO SUSP
30.0000 mL | ORAL | Status: DC | PRN
  Administered 2015-11-07 – 2015-11-08 (×3): 30 mL via ORAL
  Filled 2015-11-03 (×3): qty 30

## 2015-11-03 MED ORDER — TRAZODONE HCL 50 MG PO TABS
50.0000 mg | ORAL_TABLET | Freq: Every day | ORAL | Status: DC
  Administered 2015-11-03 – 2015-11-05 (×3): 50 mg via ORAL
  Filled 2015-11-03 (×6): qty 1

## 2015-11-03 MED ORDER — RISPERIDONE 2 MG PO TBDP
2.0000 mg | ORAL_TABLET | Freq: Every day | ORAL | Status: DC
  Administered 2015-11-03 – 2015-11-07 (×5): 2 mg via ORAL
  Filled 2015-11-03 (×3): qty 1
  Filled 2015-11-03: qty 2
  Filled 2015-11-03 (×2): qty 1
  Filled 2015-11-03: qty 7
  Filled 2015-11-03: qty 1

## 2015-11-03 MED ORDER — FINASTERIDE 5 MG PO TABS
5.0000 mg | ORAL_TABLET | Freq: Every day | ORAL | Status: DC
  Filled 2015-11-03 (×3): qty 1

## 2015-11-03 NOTE — ED Notes (Signed)
Pt denies that he has or has had any LLE pain, NP Josephine and Vascular Lab tech notified.
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
## 2015-11-03 NOTE — ED Notes (Signed)
[facility] [place] [facility] [facility], [employer] notified for transport.

## 2015-11-03 NOTE — Tx Team (Signed)
Initial Interdisciplinary Treatment Plan   PATIENT STRESSORS: Financial difficulties   PATIENT STRENGTHS: Manufacturing systems engineerCommunication skills Physical Health Supportive family/friends   PROBLEM LIST: Problem List/Patient Goals Date to be addressed Date deferred Reason deferred Estimated date of resolution  Substance abuse history 11/03/15     Psychosis 11/03/15     "What ever it is to see what happens" 11/03/15     "To say goodbye and leave here" 11/03/15                                    DISCHARGE CRITERIA:  Improved stabilization in mood, thinking, and/or behavior Motivation to continue treatment in a less acute level of care  PRELIMINARY DISCHARGE PLAN: Outpatient therapy Return to previous living arrangement  PATIENT/FAMIILY INVOLVEMENT: This treatment plan has been presented to and reviewed with the patient, Brad Barr.  The patient and family have been given the opportunity to ask questions and make suggestions.  Norm ParcelHeather V Davinity Fanara 11/03/2015, 5:56 PM

## 2015-11-03 NOTE — BH Assessment (Signed)
Patient was reassessed by TTS.   Patient states that he was brought in under IVC and does not know why he is here. Patient states that he will "just keep sleeping and taking pain medication" until he is released to go home. Patient was in his bed under the blanket during the assessment. Patient denies SI/HI and AVH at this time.   Patient has been accepted to 506-1 per Berneice Heinrichina Tate, RN, [facility] and [facility].

## 2015-11-04 ENCOUNTER — Encounter (HOSPITAL_COMMUNITY): Payer: Self-pay | Admitting: Psychiatry

## 2015-11-04 DIAGNOSIS — F29 Unspecified psychosis not due to a substance or known physiological condition: Secondary | ICD-10-CM

## 2015-11-04 MED ORDER — OLANZAPINE 5 MG PO TBDP: 5.0000 mg | ORAL_TABLET | Freq: Three times a day (TID) | ORAL | Status: DC | PRN

## 2015-11-04 MED ORDER — HYDROXYZINE HCL 25 MG PO TABS
25.0000 mg | ORAL_TABLET | Freq: Four times a day (QID) | ORAL | Status: DC | PRN
  Administered 2015-11-07: 25 mg via ORAL
  Filled 2015-11-04: qty 10
  Filled 2015-11-04: qty 1

## 2015-11-04 MED ORDER — FINASTERIDE 5 MG PO TABS
2.5000 mg | ORAL_TABLET | Freq: Every day | ORAL | Status: DC
  Administered 2015-11-05 – 2015-11-06 (×2): 2.5 mg via ORAL
  Filled 2015-11-04 (×6): qty 0.5

## 2015-11-04 MED ORDER — CALAMINE EX LOTN
TOPICAL_LOTION | Freq: Three times a day (TID) | CUTANEOUS | Status: DC
  Administered 2015-11-04: 1 via TOPICAL
  Administered 2015-11-05 – 2015-11-06 (×5): via TOPICAL
  Filled 2015-11-04 (×2): qty 118

## 2015-11-04 NOTE — [facility] Group Notes (Signed)
[facility] Group Notes:  (Clinical Social Work)  11/04/2015  11:15-12:00PM  Summary of Progress/Problems:   Today's process group involved patients discussing their feelings related to being hospitalized, as well as how they want to feel in order to be ready to discharge.  It was agreed in general by the group that it would be preferable to avoid future hospitalizations, and there was a discussion about what each person will need to do to achieve that.   Problems related to adherence to medication recommendations were discussed, as well as importance of developing friendships and supports.  The patient expressed his primary feeling about being hospitalized is "I don't like it."  He did not talk in group and then was called out to see a practitioner.  Type of Therapy:  Group Therapy - Process  Participation Level:  Minimal  Participation Quality:  Attentive  Affect:  Blunted  Cognitive:  Oriented  Insight:  Improving  Engagement in Therapy:  Improving  Modes of Intervention:  Exploration, Discussion  Ambrose MantleMareida Grossman-Orr, LCSW 11/04/2015, 1:15 PM

## 2015-11-04 NOTE — Progress Notes (Signed)
D: Pt denies SI/HI/AVH. Pt is pleasant and cooperative  A: Pt was offered support and encouragement. Pt was given scheduled medications. Pt was encourage to attend groups. Q 15 minute checks were done for safety.  R:Pt attends groups and interacts well with peers and staff. Pt is taking medication. Pt has no complaints at this time.Pt receptive to treatment and safety maintained on unit. 

## 2015-11-04 NOTE — Plan of Care (Signed)
Problem: Ineffective individual coping Goal: STG: Patient will remain free from self harm Outcome: Progressing Pt safe on the unit     
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
## 2015-11-04 NOTE — [facility] Group Notes (Signed)
[facility] Group Notes:  (Nursing/MHT/Case Management/Adjunct)  Date:  11/04/2015  Time:  10:12 AM  Type of Therapy:  Psychoeducational Skills  Participation Level:  Did Not Attend  Participation Quality:  no attendance      Affect:  no  attendance  Cognitive:  no attendance  Insight:  None  Engagement in Group:  no attendance  Modes of Intervention:  no attendance  Summary of Progress/Problems:      ---pt. Did not attend group ---  Arsenio LoaderHiatt, Immaculate Crutcher Dudley 11/04/2015, 10:12 AM

## 2015-11-04 NOTE — Progress Notes (Signed)
Patient ID: Brad EdisonJohn N Barr, male   DOB: 1976/08/24, 39 y.o.   MRN: [id number] D   --- pt. Denies pain at this time.Marland Kitchen.   He stays in bed and did not attend groups on unit.  He complains of the poison oak on his arm as itching.  He has good eye contact and agrees to contract for safety.  Pt. Is friendly and receptive to staff with no behavior issues noted.   He decline to take his Proscar this AM saying that the dosage is much to high.   He said he only takes 0.25 mg at home and that [facility].   Writer advised him to talk to his Dr. About lowering the dose.   Pt.ageed to do so.  Writer has noticed no Paranoia at this time.  ---  A  --  Support and encouragement provided.  ---  R --  Pt. Remain safe on unit

## 2015-11-05 NOTE — [facility] Group Notes (Signed)
[facility] Group Notes:  (Clinical Social Work)  11/05/2015  [facility] Group Notes:  (Clinical Social Work)  11/05/2015  11:00AM-12:00PM  Summary of Progress/Problems:  The main focus of today's process group was to listen to a variety of genres of music and to identify that different types of music provoke different responses.  The patient then was able to identify personally what was soothing for them, as well as energizing.  Handouts were used to record feelings evoked, as well as how patient can personally use this knowledge in sleep habits, with depression, and with other symptoms.  The patient expressed understanding of concepts, as well as knowledge of how each type of music affected him/her and how this can be used at home as a wellness/recovery tool.  He said he is bored in the hospital about half the time, and seemed to enjoy the music he heard, smiled a great deal, but left group early and did not return.  Type of Therapy:  Music Therapy   Participation Level:  Active  Participation Quality:  Attentive  Affect:  Blunted  Cognitive:  Hallucinating  Insight: Improving  Engagement in Therapy:  Improving  Modes of Intervention:   Activity, Exploration  Brad MantleMareida Grossman-Orr, LCSW 11/05/2015

## 2015-11-05 NOTE — Progress Notes (Signed)
[facility] Group Notes:  (Nursing/MHT/Case Management/Adjunct)  Date:  11/05/2015  Time:  12:16 AM  Type of Therapy:  Psychoeducational Skills  Participation Level:  Active  Participation Quality:  Appropriate  Affect:  Appropriate  Cognitive:  Lacking  Insight:  Lacking  Engagement in Group:  Developing/Improving  Modes of Intervention:  Education  Summary of Progress/Problems: Patient expressed in group that he has been preoccupied with "thinking about things" and that he enjoyed the meals in the cafeteria. In terms of the theme for the day, his coping skill will be to pray.    Brad Barr S 11/05/2015, 12:16 AM

## 2015-11-05 NOTE — Progress Notes (Signed)
Adult Psychoeducational Group Note  Date:  11/05/2015 Time:  9:23 PM  Group Topic/Focus:  Wrap-Up Group:   The focus of this group is to help patients review their daily goal of treatment and discuss progress on daily workbooks.  Participation Level:  Active  Participation Quality:  Appropriate and Attentive  Affect:  Appropriate  Cognitive:  Appropriate  Insight: Appropriate and Good  Engagement in Group:  Engaged  Modes of Intervention:  Education  Additional Comments:  Pt overall had a good day and his goal for tomorrow is looking forward to doing well tomorrow and participating.   Merlinda FrederickKeshia S Elenora Hawbaker 11/05/2015, 9:23 PM

## 2015-11-05 NOTE — Progress Notes (Signed)
Pt is alert and oriented x 4. Pt with blank affects denies any form of depression, anxiety, pain, SI, HI and AVH. He states, "I don't know where you all get any of these information; I wasn't hearing any voices, I have no plan to kill myself; why would I want to do that? I am doing just fine." Pt remained calm and cooperative through the assessment.   A: Medications offered as prescribed.  Support, encouragement, and safe environment provided.  15-minute safety checks continue.  R: Pt was med compliant. Pt attended wrap-up group. Safety checks continue

## 2015-11-05 NOTE — [facility] Group Notes (Signed)
The focus of this group is to educate the patient on the purpose and policies of crisis stabilization and provide a format to answer questions about their admission.  The group details unit policies and expectations of patients while admitted.  Patient did not attend 0900 nurse education orientation group this morning.  Patient stayed in his room. 

## 2015-11-05 NOTE — Plan of Care (Signed)
Problem: Consults Goal: Suicide Risk Patient Education (See Patient Education module for education specifics)  Outcome: Progressing Nurse discussed suicidal thoughts/depression with patient.

## 2015-11-05 NOTE — Progress Notes (Signed)
[facility] MD Progress Note  11/05/2015 12:21 PM Brad Barr  MRN:  [id number] Subjective:  Brad Barr continues to deny, rationalize. States that he did not hit himself to get the voices out, that he did not think the FBI was following him, and denied that he might have abused the Adderall although he stated he had not taken any in a while although the drug screen was positive. He states the he is worried about being able to get out and take care of his [place] business. States he is concerned he is going to get behind. Claims he needs the Adderall to function. Otherwise states he is very disorganized. States he was going to [facility] to see Dr. Betti Cruz who prescribed the Adderall and some Xanax but denies ever being prescribed any mood stabilizer or antipsychotic Principal Problem: Psychosis Diagnosis:   Patient Active Problem List   Diagnosis Date Noted  . Psychosis [F29] 11/02/2015   Total Time spent with patient: 20 minutes  Past Psychiatric History: see admission H and P  Past Medical History: History reviewed. No pertinent past medical history. History reviewed. No pertinent past surgical history. Family History: History reviewed. No pertinent family history. Family Psychiatric  History: See admission H and P Social History:  History  Alcohol Use  . Yes     History  Drug Use No    Social History   Social History  . Marital Status: Single    Spouse Name: N/A  . Number of Children: N/A  . Years of Education: N/A   Social History Main Topics  . Smoking status: Current Some Day Smoker  . Smokeless tobacco: None  . Alcohol Use: Yes  . Drug Use: No  . Sexual Activity: Not Asked   Other Topics Concern  . None   Social History Narrative   Additional Social History:                         Sleep: Fair  Appetite:  Fair  Current Medications: Current Facility-Administered Medications  Medication Dose Route Frequency Provider Last Rate Last Dose  . acetaminophen (TYLENOL)  tablet 650 mg  650 mg Oral Q6H PRN Earney Navy, NP      . alum & mag hydroxide-simeth (MAALOX/MYLANTA) 200-200-20 MG/5ML suspension 30 mL  30 mL Oral Q4H PRN Earney Navy, NP      . calamine lotion   Topical TID Beau Fanny, FNP      . finasteride (PROSCAR) tablet 2.5 mg  2.5 mg Oral Daily Beau Fanny, FNP   2.5 mg at 11/05/15 0855  . hydrOXYzine (ATARAX/VISTARIL) tablet 25 mg  25 mg Oral Q6H PRN Beau Fanny, FNP      . LORazepam (ATIVAN) tablet 1 mg  1 mg Oral Q8H PRN Earney Navy, NP      . magnesium hydroxide (MILK OF MAGNESIA) suspension 30 mL  30 mL Oral Daily PRN Earney Navy, NP      . OLANZapine zydis (ZYPREXA) disintegrating tablet 5 mg  5 mg Oral Q8H PRN Beau Fanny, FNP      . risperiDONE (RISPERDAL M-TABS) disintegrating tablet 2 mg  2 mg Oral QHS Earney Navy, NP   2 mg at 11/04/15 2142  . traZODone (DESYREL) tablet 50 mg  50 mg Oral QHS Earney Navy, NP   50 mg at 11/04/15 2142    Lab Results: No results found for this or any previous  visit (from the past 48 hour(s)).  Physical Findings: AIMS: Facial and Oral Movements Muscles of Facial Expression: None, normal Jaw: None, normal Tongue: None, normal,Extremity Movements Upper (arms, wrists, hands, fingers): None, normal Lower (legs, knees, ankles, toes): None, normal, Trunk Movements Neck, shoulders, hips: None, normal, Overall Severity Severity of abnormal movements (highest score from questions above): None, normal Incapacitation due to abnormal movements: None, normal Patient's awareness of abnormal movements (rate only patient's report): No Awareness, Dental Status Current problems with teeth and/or dentures?: No Does patient usually wear dentures?: No  CIWA:    COWS:     Musculoskeletal: Strength & Muscle Tone: within normal limits Gait & Station: normal Patient leans: normal  Psychiatric Specialty Exam: Review of Systems  Constitutional: Negative.   HENT:  Negative.   Eyes: Negative.   Respiratory: Negative.   Cardiovascular: Negative.   Gastrointestinal: Negative.   Genitourinary: Negative.   Musculoskeletal: Negative.   Skin: Negative.   Neurological: Negative.   Endo/Heme/Allergies: Negative.   Psychiatric/Behavioral: The patient is nervous/anxious.     Blood pressure 118/79, pulse 77, temperature 99.6 F (37.6 C), temperature source Oral, resp. rate 18, height 6\' 2"  (1.88 m), weight 95.255 kg (210 lb), SpO2 98 %.Body mass index is 26.95 kg/(m^2).  General Appearance: Fairly Groomed  Patent attorneyye Contact::  Fair  Speech:  Clear and Coherent  Volume:  Normal  Mood:  Anxious and worried about his business and finances  Affect:  Restricted  Thought Process:  Coherent and Goal Directed  Orientation:  Full (Time, Place, and Person)  Thought Content:  events worries concerns   Suicidal Thoughts:  No  Homicidal Thoughts:  No  Memory:  Immediate;   Fair Recent;   Fair Remote;   Fair  Judgement:  Fair  Insight:  Lacking  Psychomotor Activity:  Restlessness  Concentration:  Fair  Recall:  FiservFair  Fund of Knowledge:Fair  Language: Fair  Akathisia:  No  Handed:  Right  AIMS (if indicated):     Assets:  Housing Social Support Vocational/Educational  ADL's:  Intact  Cognition: WNL  Sleep:  Number of Hours: 6.75   Treatment Plan Summary: Daily contact with patient to assess and evaluate symptoms and progress in treatment and Medication management Supportive approach/coping skills Delusional ideas; continue the Risperdal 2 mg/work to improve reality testing ADHD; will work on strategies to address the inattentiveness/distractibility Will use CBT/mindfulness Note; Brad RuizJohn states that he will be willing to take the Risperdal once he is D/C. Cheralyn Oliver A 11/05/2015, 12:21 PM

## 2015-11-05 NOTE — Progress Notes (Signed)
D:  Patient's self inventory sheet, patient has fair sleep, no sleep medication given.  Good appetite, normal energy level, good concentration.  Denied depression and hopeless.  Rated anxiety #3.  Denied withdrawals.  Denied SI.  Denied physical problems.  Denied pain.  Goal is to have a good day.  Plans to have a good day.  Does have discharge plans.  No problems anticipated after discharge.. A:  Medications administered per MD order.  Emotional support and encouragement given patient. R:  Denied SI and HI, contracts for safety.  Denied A/V hallucinations.  Safety maintained with 15 minute checks.

## 2015-11-05 NOTE — [facility] Counselor (Signed)
Attempts to meet with patient over the weekend were unsuccessful as CSW was unable to wake ptient on Saturday 11/26 and patient was outside for free time today 11/27 when second attempt was made.   Carney Bernatherine C Harrill, LCSW

## 2015-11-06 DIAGNOSIS — F19950 Other psychoactive substance use, unspecified with psychoactive substance-induced psychotic disorder with delusions: Principal | ICD-10-CM

## 2015-11-06 DIAGNOSIS — F1590 Other stimulant use, unspecified, uncomplicated: Secondary | ICD-10-CM

## 2015-11-06 DIAGNOSIS — F909 Attention-deficit hyperactivity disorder, unspecified type: Secondary | ICD-10-CM | POA: Diagnosis present

## 2015-11-06 DIAGNOSIS — F900 Attention-deficit hyperactivity disorder, predominantly inattentive type: Secondary | ICD-10-CM

## 2015-11-06 DIAGNOSIS — F1310 Sedative, hypnotic or anxiolytic abuse, uncomplicated: Secondary | ICD-10-CM

## 2015-11-06 MED ORDER — PREDNISONE 10 MG PO TABS
30.0000 mg | ORAL_TABLET | Freq: Every day | ORAL | Status: DC
  Filled 2015-11-06: qty 3

## 2015-11-06 MED ORDER — TRAZODONE HCL 50 MG PO TABS
ORAL_TABLET | ORAL | Status: AC
  Filled 2015-11-06: qty 1

## 2015-11-06 MED ORDER — PREDNISONE 10 MG (21) PO TBPK: 20.0000 mg | ORAL_TABLET | Freq: Every evening | ORAL | Status: DC

## 2015-11-06 MED ORDER — PREDNISONE 10 MG PO TABS
50.0000 mg | ORAL_TABLET | Freq: Every day | ORAL | Status: AC
  Administered 2015-11-07: 50 mg via ORAL
  Filled 2015-11-06: qty 5

## 2015-11-06 MED ORDER — ZIPRASIDONE MESYLATE 20 MG IM SOLR: 20.0000 mg | INTRAMUSCULAR | Status: DC | PRN

## 2015-11-06 MED ORDER — PREDNISONE 10 MG PO TABS: 10.0000 mg | ORAL_TABLET | Freq: Every day | ORAL | Status: DC

## 2015-11-06 MED ORDER — PREDNISONE 20 MG PO TABS
40.0000 mg | ORAL_TABLET | Freq: Every day | ORAL | Status: AC
  Administered 2015-11-08: 40 mg via ORAL
  Filled 2015-11-06: qty 2

## 2015-11-06 MED ORDER — BENZTROPINE MESYLATE 0.5 MG PO TABS
0.5000 mg | ORAL_TABLET | Freq: Every day | ORAL | Status: DC
  Administered 2015-11-06 – 2015-11-07 (×2): 0.5 mg via ORAL
  Filled 2015-11-06 (×3): qty 1
  Filled 2015-11-06: qty 7

## 2015-11-06 MED ORDER — PREDNISONE 20 MG PO TABS
60.0000 mg | ORAL_TABLET | Freq: Once | ORAL | Status: AC
  Administered 2015-11-06: 60 mg via ORAL
  Filled 2015-11-06 (×2): qty 3

## 2015-11-06 MED ORDER — OLANZAPINE 10 MG PO TBDP
10.0000 mg | ORAL_TABLET | Freq: Three times a day (TID) | ORAL | Status: DC | PRN
Start: 2015-11-06 — End: 2015-11-08
  Administered 2015-11-06 – 2015-11-07 (×2): 10 mg via ORAL
  Filled 2015-11-06 (×2): qty 1

## 2015-11-06 MED ORDER — PREDNISONE 10 MG (21) PO TBPK: 10.0000 mg | ORAL_TABLET | ORAL | Status: DC

## 2015-11-06 MED ORDER — PREDNISONE 20 MG PO TABS
20.0000 mg | ORAL_TABLET | Freq: Every day | ORAL | Status: DC
Start: 2015-11-10 — End: 2015-11-08
  Filled 2015-11-06: qty 1

## 2015-11-06 MED ORDER — PREDNISONE 10 MG (21) PO TBPK: 20.0000 mg | ORAL_TABLET | Freq: Every morning | ORAL | Status: DC

## 2015-11-06 MED ORDER — PREDNISONE 10 MG (21) PO TBPK: 10.0000 mg | ORAL_TABLET | Freq: Four times a day (QID) | ORAL | Status: DC

## 2015-11-06 MED ORDER — PREDNISONE 10 MG (21) PO TBPK: 10.0000 mg | ORAL_TABLET | Freq: Three times a day (TID) | ORAL | Status: DC

## 2015-11-06 MED ORDER — LORAZEPAM 1 MG PO TABS: 1.0000 mg | ORAL_TABLET | ORAL | Status: DC | PRN

## 2015-11-06 MED ORDER — TRAZODONE HCL 50 MG PO TABS
25.0000 mg | ORAL_TABLET | Freq: Every day | ORAL | Status: DC
  Administered 2015-11-06 – 2015-11-07 (×2): 25 mg via ORAL
  Filled 2015-11-06: qty 4
  Filled 2015-11-06 (×3): qty 1

## 2015-11-06 NOTE — Progress Notes (Signed)
D: Pt presents anxious in affect and mood. Pt requested ativan for writer to administer. Pt was informed that his medication order details that Ativan to be given if there is no relief with the Zyprexa. Pt was receptive to writer's explanation. Pt denied any SI/HI/AVH.  A: Writer administered scheduled and prn medications to pt, per MD orders. Continued support and availability as needed was extended to this pt. Staff continues to monitor pt with q7415min checks.  R: No adverse drug reactions noted. Pt receptive to treatment. Pt remains safe at this time.

## 2015-11-06 NOTE — Progress Notes (Signed)
[facility] MD Progress Note  11/06/2015 1:37 PM Brad Barr  MRN:  [id number] Subjective: Pt states " I am fine , its my mother who felt I needed help.'   Objective : Pt is a Caucasian male, 39 years old who presented IVC ed - per initial notes in EHR " Patient was IVC by his mother and was brought in by [employer]. Patient did not want to answer questions asked of him this morning but stated" You should know" Patient has bruised area bilaterally to his lower eyes and multiple superficial cuts to his right arm. When asked how he sustained his injury he stated that he was doing some repairs and hurt his eye area. IVC paper stated that his mother is concerned about his son trying hard to hurt himself and trying to injure his jaw."  Patient seen today and chart reviewed.Discussed patient with treatment team. I have reviewed Dr.Lugo's notes in EHR . Pt today seems calm - however is very guarded. Pt reports his only diagnosis is ADHD and that he was seeing Dr.Reddi who was prescribing him adderall . Pt reports that he was on 60 mg previously , but was increased to 90 mg recently. Pt's UDS positive for stimulants .  Possible abuse of adderall - pt likely misusing his prescribed medications. Pt otherwise denies any concerns - Pt also does not give permission to contact his mother - states he is not going back to her.  per nursing - pt has been having some anxiety issues, conflict with room mate . Pt also has rashes , skin lesions all over his body- presented with the same.  O/E- has papular lesions- all over his upper extremities - also has skin lacerations over his arm- pt reports it is poison ivy.   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 Attention deficit hyperactivity disorder (ADHD) [F90.9] 11/06/2015  . 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 Psychosis [F29] 11/02/2015   Total Time spent with patient: 25 minutes  Past Psychiatric History: see admission H and P- ADHD   Past Medical History: pt denies any hx of HTN, DM,thyroid, asthma. Family History: Pt denies hx of medical issues as noted above in family. Family Psychiatric  History: See admission H and P Social History:  History  Alcohol Use  . Yes     History  Drug Use No    Social History   Social History  . Marital Status: Single    Spouse Name: N/A  . Number of Children: N/A  . Years of Education: N/A   Social History Main Topics  . Smoking status: Current Some Day Smoker  . Smokeless tobacco: None  . Alcohol Use: Yes  . Drug Use: No  . Sexual Activity: Not Asked   Other Topics Concern  . None   Social History Narrative   Additional Social History:                         Sleep: Fair  Appetite:  Fair  Current Medications: Current Facility-Administered Medications  Medication Dose Route Frequency Provider Last Rate Last Dose  . acetaminophen (TYLENOL) tablet 650 mg  650 mg Oral Q6H PRN Earney Navy, NP   650 mg at 11/06/15 1030  . alum &  mag hydroxide-simeth (MAALOX/MYLANTA) 200-200-20 MG/5ML suspension 30 mL  30 mL Oral Q4H PRN Earney NavyJosephine C Onuoha, NP      . calamine lotion   Topical TID Beau FannyJohn C Withrow, FNP      . finasteride (PROSCAR) tablet 2.5 mg  2.5 mg Oral Daily Beau FannyJohn C Withrow, FNP   2.5 mg at 11/06/15 0759  . hydrOXYzine (ATARAX/VISTARIL) tablet 25 mg  25 mg Oral Q6H PRN Beau FannyJohn C Withrow, FNP      . OLANZapine zydis (ZYPREXA) disintegrating tablet 10 mg  10 mg Oral Q8H PRN Jomarie LongsSaramma Hank Walling, MD       And  . LORazepam (ATIVAN) tablet 1 mg  1 mg Oral PRN Jomarie LongsSaramma Ellianna Ruest, MD       And  . ziprasidone (GEODON) injection 20 mg  20 mg Intramuscular PRN Jomarie LongsSaramma Basheer Molchan, MD      . magnesium hydroxide (MILK OF MAGNESIA)  suspension 30 mL  30 mL Oral Daily PRN Earney NavyJosephine C Onuoha, NP      . predniSONE (STERAPRED UNI-PAK 21 TAB) tablet 10 mg  10 mg Oral PC lunch Yamilette Garretson, MD      . predniSONE (STERAPRED UNI-PAK 21 TAB) tablet 10 mg  10 mg Oral PC supper Jomarie LongsSaramma Keonta Monceaux, MD      . Melene Muller[START ON 11/07/2015] predniSONE (STERAPRED UNI-PAK 21 TAB) tablet 10 mg  10 mg Oral 3 x daily with food Jomarie LongsSaramma Forbes Loll, MD      . Melene Muller[START ON 11/08/2015] predniSONE (STERAPRED UNI-PAK 21 TAB) tablet 10 mg  10 mg Oral 4X daily taper Ryonna Cimini, MD      . predniSONE (STERAPRED UNI-PAK 21 TAB) tablet 20 mg  20 mg Oral AC breakfast Tavi Hoogendoorn, MD      . predniSONE (STERAPRED UNI-PAK 21 TAB) tablet 20 mg  20 mg Oral Nightly Jomarie LongsSaramma Demaria Deeney, MD      . Melene Muller[START ON 11/07/2015] predniSONE (STERAPRED UNI-PAK 21 TAB) tablet 20 mg  20 mg Oral Nightly Eiliana Drone, MD      . risperiDONE (RISPERDAL M-TABS) disintegrating tablet 2 mg  2 mg Oral QHS Earney NavyJosephine C Onuoha, NP   2 mg at 11/05/15 2118  . traZODone (DESYREL) tablet 50 mg  50 mg Oral QHS Earney NavyJosephine C Onuoha, NP   50 mg at 11/05/15 2118    Lab Results: No results found for this or any previous visit (from the past 48 hour(s)).  Physical Findings: AIMS: Facial and Oral Movements Muscles of Facial Expression: None, normal Lips and Perioral Area: None, normal Jaw: None, normal Tongue: None, normal,Extremity Movements Upper (arms, wrists, hands, fingers): None, normal Lower (legs, knees, ankles, toes): None, normal, Trunk Movements Neck, shoulders, hips: None, normal, Overall Severity Severity of abnormal movements (highest score from questions above): None, normal Incapacitation due to abnormal movements: None, normal Patient's awareness of abnormal movements (rate only patient's report): No Awareness, Dental Status Current problems with teeth and/or dentures?: No Does patient usually wear dentures?: No  CIWA:  CIWA-Ar Total: 1 COWS:  COWS Total Score:  1  Musculoskeletal: Strength & Muscle Tone: within normal limits Gait & Station: normal Patient leans: normal  Psychiatric Specialty Exam: Review of Systems  Constitutional: Negative.   HENT: Negative.   Eyes: Negative.   Respiratory: Negative.   Cardiovascular: Negative.   Gastrointestinal: Negative.   Genitourinary: Negative.   Musculoskeletal: Negative.   Skin: Positive for rash.       Has maculopapular lesions over his upper extremities  Neurological: Negative.   Endo/Heme/Allergies: Negative.  Psychiatric/Behavioral: The patient is nervous/anxious.   All other systems reviewed and are negative.   Blood pressure 112/70, pulse 76, temperature 98.6 F (37 C), temperature source Oral, resp. rate 18, height  (1.88 m), weight 95.255 kg (210 lb), SpO2 98 %.Body mass index is 26.95 kg/(m^2).  General Appearance: Fairly Groomed  Patent attorney::  Fair  Speech:  Clear and Coherent  Volume:  Normal  Mood:  Anxious  Affect:  Congruent  Thought Process:  Coherent and Goal Directed  Orientation:  Full (Time, Place, and Person)  Thought Content:  Rumination  Suicidal Thoughts:  No  Homicidal Thoughts:  No  Memory:  Immediate;   Fair Recent;   Fair Remote;   Fair  Judgement:  Fair  Insight:  Lacking  Psychomotor Activity:  Restlessness  Concentration:  Fair  Recall:  [employer] of Knowledge:Fair  Language: Fair  Akathisia:  No  Handed:  Right  AIMS (if indicated):     Assets:  Housing Social Support Vocational/Educational  ADL's:  Intact  Cognition: WNL  Sleep:  Number of Hours: 6.5   Treatment Plan Summary: Pt today continues to be guarded, anxious - lacks insight in to his current admission or presentation. Pt reports hx of ADHD - possible abuse of adderall. Will continue to observe pt on the unit.  Daily contact with patient to assess and evaluate symptoms and progress in treatment and Medication management    Reviewed past medical records,treatment plan. I  have reviewed [place] controlled substance database- pt was being prescribed Adderall /xanax by Dr.Reddi - until 08/30/15- pt on 08/30/15- received adderall 30 mg tab- 30 days supply - 90 pills- ?misuse of adderall, which likely could have resulted in his current presentation. Pt was also being prescribed xanax 1 mg  - last script 08/30/15- 30 days supply- 120 tab . Pts UDS was not positive for BZD. Will continue Risperidone 2 mg po qhs for psychosis/mood sx. Will continue Cogentin 0.5 mg po qhs for EPS. Will continue Trazodone 50 mg po qhs for sleep. Will add Vistaril 25 mg po q6h prn for anxiety sx. Will also add prednisone taper for skin lesions - ?poison ivy. Will continue to monitor vitals ,medication compliance and treatment side effects while patient is here.  Will monitor for medical issues as well as call consult as needed.  Reviewed labs ,will order TSH, lipid panel, hba1c, TSH, pl- ekg reviewed - wnl.  CSW will start working on disposition.  Patient to participate in therapeutic milieu .          Lexi Conaty MD 11/06/2015, 1:37 PM

## 2015-11-06 NOTE — [facility] Group Notes (Signed)
[facility] Group Notes:  (Counselor/Nursing/MHT/Case Management/Adjunct)  11/06/2015 1:15PM  Type of Therapy:  Group Therapy  Participation Level:  Active  Participation Quality:  Appropriate  Affect:  Flat  Cognitive:  Oriented  Insight:  Improving  Engagement in Group:  Limited  Engagement in Therapy:  Limited  Modes of Intervention:  Discussion, Exploration and Socialization  Summary of Progress/Problems: The topic for group was balance in life.  Pt participated in the discussion about when their life was in balance and out of balance and how this feels.  Pt discussed ways to get back in balance and short term goals they can work on to get where they want to be. Came briefly.  Talked about struggling with maintaining in a place where he feels confined and closed in.  Stated the way he deals with this is by reminding himself "to not give up, no matter what."  York SpanielSaid he learned this from his father, but was vague as to how the message was passed on.  Left soon thereafter.   Ida Rogueorth, Maitlyn Penza B 11/06/2015 3:55 PM

## 2015-11-06 NOTE — Progress Notes (Signed)
Patient was asked by MD if he would like to sign voluntary admission form.  Patient refused and stated he wants to remain involuntary.  Nurse left message with administrative office that patient wishes to remain involuntary admission.    D:  Patient's self inventory sheet, patient has fair sleep, sleep medication is not helpful.  Good appetite, normal energy level, good concentration.  Denied depression and hopeless, rated anxiety 4-5.  Denied withdrawals.  Denied SI.  Physical problems poison ivy.  Denied pain.  Plans to have a good day.  Goal is to have a good day.  Does have discharge plans. A:  Medications administered per MD orders.  Emotional support and encouragement given patient. R:  Patient denied SI and HI, contracts for safety.  Denied A/V hallucinations.  Safety maintained with 15 minute checks.

## 2015-11-06 NOTE — Plan of Care (Signed)
Problem: Consults Goal: Substance Abuse Patient Education See Patient Education Module for education specifics.  Outcome: Progressing Nurse discussed substance abuse/depression/coping skills with patient.     

## 2015-11-06 NOTE — Progress Notes (Signed)
Pt is alert and oriented x 4. Pt continues to deny any form of depression, anxiety, pain, SI, HI and AVH. He states, "I am feeling better and better every day." Pt was observed interacting with peers. Pt remained calm and cooperative through the assessment.   A: Medications offered as prescribed.  Support, encouragement, and safe environment provided.  15-minute safety checks continue.  R: Pt was med compliant. Pt attended wrap-up group. Safety checks continue

## 2015-11-07 DIAGNOSIS — F411 Generalized anxiety disorder: Secondary | ICD-10-CM | POA: Clinically undetermined

## 2015-11-07 LAB — LIPID PANEL
CHOL/HDL RATIO: 3.7 ratio
CHOLESTEROL: 208 mg/dL — AB (ref 0–200)
HDL: 56 mg/dL (ref >40)
LDL Cholesterol: 116 mg/dL — ABNORMAL HIGH (ref 0–99)
TRIGLYCERIDES: 181 mg/dL — AB (ref <150)
VLDL: 36 mg/dL (ref 0–40)

## 2015-11-07 LAB — TSH: TSH: 1.295 u[IU]/mL (ref 0.350–4.500)

## 2015-11-07 MED ORDER — CITALOPRAM HYDROBROMIDE 10 MG PO TABS
10.0000 mg | ORAL_TABLET | Freq: Every day | ORAL | Status: DC
  Administered 2015-11-07 – 2015-11-08 (×2): 10 mg via ORAL
  Filled 2015-11-07 (×3): qty 1
  Filled 2015-11-07: qty 7
  Filled 2015-11-07: qty 1

## 2015-11-07 NOTE — Plan of Care (Signed)
Problem: Ineffective individual coping Goal: STG: Patient will remain free from self harm Outcome: Progressing Brad RuizJohn has had to episodes of self harm while in the hospital.  We will continue to monitor the progress towards his goals.

## 2015-11-07 NOTE — [facility] Group Notes (Signed)
[facility] LCSW Group Therapy  11/07/2015 2:18 PM   Type of Therapy:  Group Therapy  Participation Level:  Active  Participation Quality:  Attentive  Affect:  Appropriate  Cognitive:  Appropriate  Insight:  Improving  Engagement in Therapy:  Engaged  Modes of Intervention:  Clarification, Education, Exploration and Socialization  Summary of Progress/Problems: Today's group focused on relapse prevention.  We defined the term, and then brainstormed on ways to prevent relapse.  Insists he has always been in recovery and never in relapse.  States he is here only because he was involved with family members who turned on him.  His plan is to decrease contact.  Limited insight.  Minimization of substance abuse.  [name], Brad Barr 11/07/2015 , 2:18 PM

## 2015-11-07 NOTE — Tx Team (Signed)
Interdisciplinary Treatment Plan Update (Adult)  Date:  11/07/2015   Time Reviewed:  8:33 AM   Progress in Treatment: Attending groups: Yes. Participating in groups:  Minimally Taking medication as prescribed:  Yes. Tolerating medication:  Yes. Family/Significant other contact made:  No  Pt refused permission Patient understands diagnosis:  No  In denial about substance abuse Discussing patient identified problems/goals with staff:  Yes, see initial care plan. Medical problems stabilized or resolved:  Yes. Denies suicidal/homicidal ideation: Yes. Issues/concerns per patient self-inventory:  No. Other:  New problem(s) identified:  Discharge Plan or Barriers:  See below  Reason for Continuation of Hospitalization: Medication stabilization  Comments:  Pt today continues to be guarded, anxious - lacks insight in to his current admission or presentation. Pt reports hx of ADHD - possible abuse of adderall. Will continue to observe pt on the unit. Plan 11/06/15: I have reviewed [place] controlled substance database- pt was being prescribed Adderall /xanax by Dr.Reddi - until 08/30/15- pt on 08/30/15- received adderall 30 mg tab- 30 days supply - 90 pills-misuse of adderall, which likely could have resulted in his current presentation. Pt was also being prescribed xanax 1 mg - last script 08/30/15- 30 days supply- 120 tab . Pts UDS was not positive for BZD. Will continue Risperidone 2 mg po qhs for psychosis/mood sx. Will continue Cogentin 0.5 mg po qhs for EPS. Will continue Trazodone 50 mg po qhs for sleep. Will add Vistaril 25 mg po q6h prn for anxiety sx.  Estimated length of stay: Likely d/c tomorrow, Thursday at the latest  New goal(s):  Review of initial/current patient goals per problem list:   Review of initial/current patient goals per problem list:  1. Goal(s): Patient will participate in aftercare plan   Met: Yes   Target date: 3-5 days post admission date   As  evidenced by: Patient will participate within aftercare plan AEB aftercare Deiondre Harrower and housing plan at discharge being identified. 11/07/15:  Plans to return home, and follow up outpt       5. Goal(s): Patient will demonstrate decreased signs of psychosis  * Met: Yes  * Target date: 3-5 days post admission date  * As evidenced by: Patient will demonstrate decreased frequency of AVH or return to baseline function 11/07/15:  Pt was likely suffering from stimulant induced psychosis at admission.  He exhibits no signs nor symptoms of psychosis today.  Although he is willing to take medication as prescribed here, he openly states he plans to stop his current meds and pursue previous meds           Attendees: Patient:  11/07/2015 8:33 AM   Family:   11/07/2015 8:33 AM   Physician:  Ursula Alert, MD 11/07/2015 8:33 AM   Nursing:   Gaylan Gerold, RN 11/07/2015 8:33 AM   CSW:    Roque Lias, LCSW   11/07/2015 8:33 AM   Other:  11/07/2015 8:33 AM   Other:   11/07/2015 8:33 AM   Other:  Lars Pinks, Nurse CM 11/07/2015 8:33 AM   Other:   11/07/2015 8:33 AM   Other:  Norberto Sorenson, Maish Vaya  11/07/2015 8:33 AM   Other:  11/07/2015 8:33 AM   Other:  11/07/2015 8:33 AM   Other:  11/07/2015 8:33 AM   Other:  11/07/2015 8:33 AM   Other:  11/07/2015 8:33 AM   Other:   11/07/2015 8:33 AM    Scribe for Treatment Team:   Trish Mage, 11/07/2015 8:33 AM

## 2015-11-07 NOTE — Progress Notes (Signed)
[facility] MD Progress Note  11/07/2015 1:05 PM Brad Barr  MRN:  [id number] Subjective: Pt states " I am OK, I do have anxiety - I worry a lot , I also have mild panic attacks , it runs in my family, that is why I was on Xanax.'    Objective : Pt is a Caucasian male, 39 years old who presented IVC ed - per initial notes in EHR " Patient was IVC by his mother and was brought in by [employer]. Patient did not want to answer questions asked of him this morning but stated" You should know" Patient has bruised area bilaterally to his lower eyes and multiple superficial cuts to his right arm. When asked how he sustained his injury he stated that he was doing some repairs and hurt his eye area. IVC paper stated that his mother is concerned about his son trying hard to hurt himself and trying to injure his jaw."  Patient seen today and chart reviewed.Discussed patient with treatment team.  Pt today continues to be guarded , is mildly anxious . Discussed adding an antianxiety agent - SSRI - for replacing the xanax- pt was on xanax 1 mg 2-3 times a day prior to admission. Pt agrees with the plan. Pt denies any other concerns today. Discussed the possibility of adderall being at a high dose of 90 mg causing him to have psychosis as well as anxiety issues .Discussed changing his stimulant to another one or to a nonstimulant . Pt reports that he does not want to follow up Dr.Reddi anymore and is in between providers.  Pt started on prednisone for skin lesions - is improving - denies any new concerns.     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 GAD (generalized anxiety disorder) [F41.1] 11/07/2015  . 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 Attention deficit hyperactivity disorder (ADHD) [F90.9] 11/06/2015  . 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 Psychosis [F29] 11/02/2015   Total Time spent with patient: 25 minutes  Past Psychiatric History: see admission H and P- ADHD   Past Medical History: pt denies any hx of HTN, DM,thyroid, asthma. Family History: Pt denies hx of medical issues as noted above in family. Family Psychiatric  History: See admission H and P Social History:  History  Alcohol Use  . Yes     History  Drug Use No    Social History   Social History  . Marital Status: Single    Spouse Name: N/A  . Number of Children: N/A  . Years of Education: N/A   Social History Main Topics  . Smoking status: Current Some Day Smoker  . Smokeless tobacco: None  . Alcohol Use: Yes  . Drug Use: No  . Sexual Activity: Not Asked   Other Topics Concern  . None   Social History Narrative   Additional Social History:                         Sleep: Fair  Appetite:  Fair  Current Medications: Current Facility-Administered Medications  Medication Dose Route Frequency Provider Last Rate Last Dose  . acetaminophen (TYLENOL) tablet 650 mg  650 mg Oral Q6H PRN Earney Navy, NP   650 mg at 11/06/15 1030  . alum & mag hydroxide-simeth (MAALOX/MYLANTA)  200-200-20 MG/5ML suspension 30 mL  30 mL Oral Q4H PRN Earney Navy, NP   30 mL at 11/07/15 1031  . benztropine (COGENTIN) tablet 0.5 mg  0.5 mg Oral QHS Jomarie Longs, MD   0.5 mg at 11/06/15 2144  . calamine lotion   Topical TID Beau Fanny, FNP      . citalopram (CELEXA) tablet 10 mg  10 mg Oral Daily Katriana Dortch, MD      . finasteride (PROSCAR) tablet 2.5 mg  2.5 mg Oral Daily Beau Fanny, FNP   2.5 mg at 11/06/15 0759  . hydrOXYzine (ATARAX/VISTARIL) tablet 25 mg  25 mg Oral Q6H PRN Beau Fanny, FNP      . OLANZapine zydis (ZYPREXA) disintegrating tablet 10 mg  10 mg Oral Q8H PRN Jomarie Longs, MD   10 mg at 11/07/15 1137   And  . LORazepam (ATIVAN)  tablet 1 mg  1 mg Oral PRN Jomarie Longs, MD       And  . ziprasidone (GEODON) injection 20 mg  20 mg Intramuscular PRN Jael Waldorf, MD      . magnesium hydroxide (MILK OF MAGNESIA) suspension 30 mL  30 mL Oral Daily PRN Earney Navy, NP      . Melene Muller ON 11/08/2015] predniSONE (DELTASONE) tablet 40 mg  40 mg Oral Q breakfast Jomarie Longs, MD       Followed by  . [START ON 11/09/2015] predniSONE (DELTASONE) tablet 30 mg  30 mg Oral Q breakfast Jomarie Longs, MD       Followed by  . [START ON 11/10/2015] predniSONE (DELTASONE) tablet 20 mg  20 mg Oral Q breakfast Jomarie Longs, MD       Followed by  . [START ON 11/11/2015] predniSONE (DELTASONE) tablet 10 mg  10 mg Oral Q breakfast Alizon Schmeling, MD      . risperiDONE (RISPERDAL M-TABS) disintegrating tablet 2 mg  2 mg Oral QHS Earney Navy, NP   2 mg at 11/06/15 2144  . traZODone (DESYREL) tablet 25 mg  25 mg Oral QHS Kerry Hough, PA-C   25 mg at 11/06/15 2147    Lab Results:  Results for orders placed or performed during the hospital encounter of 11/03/15 (from the past 48 hour(s))  Lipid panel     Status: Abnormal   Collection Time: 11/07/15  6:45 AM  Result Value Ref Range   Cholesterol 208 (H) 0 - 200 mg/dL   Triglycerides 161 (H) <150 mg/dL   HDL 56 >09 mg/dL   Total CHOL/HDL Ratio 3.7 RATIO   VLDL 36 0 - 40 mg/dL   LDL Cholesterol 604 (H) 0 - 99 mg/dL    Comment:        Total Cholesterol/HDL:CHD Risk Coronary Heart Disease Risk Table                     Men   Women  1/2 Average Risk   3.4   3.3  Average Risk       5.0   4.4  2 X Average Risk   9.6   7.1  3 X Average Risk  23.4   11.0        Use the calculated Patient Ratio above and the CHD Risk Table to determine the patient's CHD Risk.        ATP III CLASSIFICATION (LDL):  <100     mg/dL   Optimal  540-981  mg/dL   Near  or Above                    Optimal  130-159  mg/dL   Borderline  098-119  mg/dL   High  >147     mg/dL   Very  High Performed at [facility]   TSH     Status: None   Collection Time: 11/07/15  6:45 AM  Result Value Ref Range   TSH 1.295 0.350 - 4.500 uIU/mL    Comment: Performed at [facility]    Physical Findings: AIMS: Facial and Oral Movements Muscles of Facial Expression: None, normal Lips and Perioral Area: None, normal Jaw: None, normal Tongue: None, normal,Extremity Movements Upper (arms, wrists, hands, fingers): None, normal Lower (legs, knees, ankles, toes): None, normal, Trunk Movements Neck, shoulders, hips: None, normal, Overall Severity Severity of abnormal movements (highest score from questions above): None, normal Incapacitation due to abnormal movements: None, normal Patient's awareness of abnormal movements (rate only patient's report): No Awareness, Dental Status Current problems with teeth and/or dentures?: No Does patient usually wear dentures?: No  CIWA:  CIWA-Ar Total: 1 COWS:  COWS Total Score: 1  Musculoskeletal: Strength & Muscle Tone: within normal limits Gait & Station: normal Patient leans: normal  Psychiatric Specialty Exam: Review of Systems  Constitutional: Negative.   HENT: Negative.   Eyes: Negative.   Respiratory: Negative.   Cardiovascular: Negative.   Gastrointestinal: Negative.   Genitourinary: Negative.   Musculoskeletal: Negative.   Skin: Positive for rash.       Has maculopapular lesions over his upper extremities  Neurological: Negative.   Endo/Heme/Allergies: Negative.   Psychiatric/Behavioral: The patient is nervous/anxious.   All other systems reviewed and are negative.   Blood pressure 107/77, pulse 89, temperature 98.7 F (37.1 C), temperature source Oral, resp. rate 16, height  (1.88 m), weight 95.255 kg (210 lb), SpO2 98 %.Body mass index is 26.95 kg/(m^2).  General Appearance: Fairly Groomed  Patent attorney::  Fair  Speech:  Clear and Coherent  Volume:  Normal  Mood:  Anxious  Affect:   Congruent  Thought Process:  Coherent and Goal Directed  Orientation:  Full (Time, Place, and Person)  Thought Content:  Rumination  Suicidal Thoughts:  No  Homicidal Thoughts:  No  Memory:  Immediate;   Fair Recent;   Fair Remote;   Fair  Judgement:  Fair  Insight:  Lacking  Psychomotor Activity:  Restlessness  Concentration:  Fair  Recall:  [employer] of Knowledge:Fair  Language: Fair  Akathisia:  No  Handed:  Right  AIMS (if indicated):     Assets:  Housing Social Support Vocational/Educational  ADL's:  Intact  Cognition: WNL  Sleep:  Number of Hours: 6.25   Treatment Plan Summary: Pt today continues to be guarded, anxious - lacks insight in to his current admission or presentation. Pt reports hx of ADHD - possible abuse of adderall. Will add an SSRI for anxiety sx. Will continue to observe pt on the unit.  Daily contact with patient to assess and evaluate symptoms and progress in treatment and Medication management    Reviewed past medical records,treatment plan. I have reviewed [place] controlled substance database- pt was being prescribed Adderall /xanax by Dr.Reddi - until 08/30/15- pt on 08/30/15- received adderall 30 mg tab- 30 days supply - 90 pills- ?misuse of adderall, which likely could have resulted in his current presentation. Pt was also being prescribed xanax 1 mg  - last script  08/30/15- 30 days supply- 120 tab . Pts UDS was not positive for BZD.  Will add Celexa 10 mg po daily for anxiety sx. Will continue Risperidone 2 mg po qhs for psychosis/mood sx. Will continue Cogentin 0.5 mg po qhs for EPS. Will continue Trazodone 25 mg po qhs for sleep. Will continue Vistaril 25 mg po q6h prn for anxiety sx. Will also continue  prednisone taper for skin lesions - ?poison ivy. Will continue to monitor vitals ,medication compliance and treatment side effects while patient is here.  Will monitor for medical issues as well as call consult as needed.  Reviewed labs - TSH- wnl  , lipid panel- slightly abnormal , hba1c- pending , pl- pending - ekg reviewed - wnl.  Dietician consult for elevated lipid panel. CSW will start working on disposition.  Patient to participate in therapeutic milieu .          Rovena Hearld MD 11/07/2015, 1:05 PM

## 2015-11-07 NOTE — B[facility] Group Notes (Signed)
[facility] Group Notes:  (Nursing/MHT/Case Management/Adjunct)  Date:  11/07/2015  Time:  11:20 AM  Type of Therapy:  Nurse Education  Participation Level:  Did Not Attend  Participation Quality:    Affect:    Cognitive:    Insight:    Engagement in Group:    Modes of Intervention:  Discussion and Education  Summary of Progress/Problems:  Group discussion was sleep hygiene, importance in communication with medical providers and positive support systems.  Brad Barr did not attend group.  Norm ParcelHeather V Bradon Fester 11/07/2015, 11:20 AM

## 2015-11-07 NOTE — BHH Counselor (Signed)
Adult Comprehensive Assessment  Patient ID: Brad Barr, male   DOB: [birth year], 39 y.o.   MRN: [id number]  Information Source: Information source: Patient  Current Stressors:  Employment / Job issues: self employed Family Relationships: While he identifies them as supports, he also identifies his family as Hospital doctor / Lack of resources (include bankruptcy): Dependent upon parents for financial help Substance abuse: denies issues despite recent DUI  Living/Environment/Situation:  Living Arrangements: Alone Living conditions (as described by patient or guardian): good How long has patient lived in current situation?: 6 years What is atmosphere in current home: Comfortable  Family History:  Are you sexually active?: Yes Does patient have children?: No  Childhood History:  By whom was/is the patient raised?: Both parents Patient's description of current relationship with people who raised him/her: good-but they are having marital issues and dealing with pt's sister and son and it's all very complicated Does patient have siblings?: Yes Number of Siblings: 2 Description of patient's current relationship with siblings: 1 half brother in [place]-limited contact   Good with sister who is currently staying with parents Did patient suffer any verbal/emotional/physical/sexual abuse as a child?: No Did patient suffer from severe childhood neglect?: No Has patient ever been sexually abused/assaulted/raped as an adolescent or adult?: No Was the patient ever a victim of a crime or a disaster?: No Witnessed domestic violence?: No Has patient been effected by domestic violence as an adult?: No  Education:  Highest grade of school patient has completed: GED through [employer] Currently a Consulting civil engineer?: No Learning disability?: No  Employment/Work Situation:   Employment situation: Employed Where is patient currently employed?: 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 [employer] How long  has patient been employed?: 2007 Patient's job has been impacted by current illness: No What is the longest time patient has a held a job?: 8 years 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 Has patient ever been in the [employer]?: No Has patient ever served in combat?: No Are There Guns or Other Weapons in Your Home?: No  Financial Resources:   Financial resources: Income from employment, Support from parents / caregiver, Food stamps Does patient have a representative payee or guardian?: No  Alcohol/Substance Abuse:   What has been your use of drugs/alcohol within the last 12 months?: Drink wine-glass a week-denies weed-denies overuse of amphetamines, benzos Alcohol/Substance Abuse Treatment Hx: Denies past history Has alcohol/substance abuse ever caused legal problems?: Yes (DUI in 99  Recent one in which he was DUI but did not have alcohol in system-he is fighting it)  Social Support System:   Patient's Community Support System: Production assistant, radio System: parents, sister Type of faith/religion: Christian-believe in [place] How does patient's faith help to cope with current illness?: "My faith is everything to me"  Leisure/Recreation:   Leisure and Hobbies: working-taking care of things-setting goals and meeting them  Strengths/Needs:   What things does the patient do well?: good at everything he tries-quick Advice worker In what areas does patient struggle / problems for patient: "My family-trying to help them get straightened out"  Discharge Plan:   Does patient have access to transportation?: Yes Will patient be returning to same living situation after discharge?: Yes Currently receiving community mental health services: No If no, would patient like referral for services when discharged?: Yes (What county?) ([place] in [place]) Does patient have financial barriers related to discharge medications?: Yes Patient description of  barriers related to discharge medications:  limited income, no insurance  Summary/Recommendations:   Summary and Recommendations (to be completed by the evaluator): Brad Barr is a 39 YO Caucasian male who is here due to symptoms of psychosis related to stimulant abuse, which he denies, as well as denying  any other alcohol or substance abuse despite a recent DUI.  He is focused on getting back home and  taking care of business, including working with an attorney on his DUI and helping his parents with his nephew. He is not able to return to [facility] as there was a descrepancy with his controlled prescriptions. He can benefit from crises stabilization, medication management, therapeutic milieu and referral for services.  Brad Barr, Brad Mokry B. 11/07/2015

## 2015-11-07 NOTE — Progress Notes (Signed)
Adult Psychoeducational Group Note  Date:  11/07/2015 Time:  9:04 PM  Group Topic/Focus:  Wrap-Up Group:   The focus of this group is to help patients review their daily goal of treatment and discuss progress on daily workbooks.  Participation Level:  Active  Participation Quality:  Appropriate  Affect:  Appropriate  Cognitive:  Appropriate  Insight: Appropriate  Engagement in Group:  Engaged  Modes of Intervention:  Discussion  Additional Comments: The patient expressed that he rates his day a 7.The patient also said that he attended group.  Octavio Mannshigpen, Maliha Outten Lee 11/07/2015, 9:04 PM

## 2015-11-07 NOTE — Progress Notes (Signed)
DAR Note: Brad RuizJohn has been visible on the unit.  Attending groups.  Minimal interactions with peers.  He took his medications without difficulty but c/o of indigestion with the prednisone.  Encouraged him to make sure that he eats a good breakfast before taking the medication because prednisone can cause indigestion.  Maalox given prn with good relief.  He denies any SI/HI or A/V hallucinations.  He reported that he was having increase in his agitation and prn zyprexa given with good relief.  He completed his self inventory and reports that his depression and hopelessness are 0-10 and anxiety 4-5/10.  He states that his goal for today is to "have a great day."  He is hoping to meet his goal by "having a good day."  He denies any pain and appears to be in no physical distress.  Q 15 minute checks maintained for safety.  We will continue to monitor the progress towards his goals.

## 2015-11-08 DIAGNOSIS — F900 Attention-deficit hyperactivity disorder, predominantly inattentive type: Secondary | ICD-10-CM | POA: Insufficient documentation

## 2015-11-08 LAB — HEMOGLOBIN A1C
Hgb A1c MFr Bld: 5.5 % (ref 4.8–5.6)
Mean Plasma Glucose: 111 mg/dL

## 2015-11-08 LAB — PROLACTIN: Prolactin: 63.1 ng/mL — ABNORMAL HIGH (ref 4.0–15.2)

## 2015-11-08 MED ORDER — PREDNISONE 10 MG PO TABS: 30.0000 mg | ORAL_TABLET | Freq: Every day | ORAL | Status: DC

## 2015-11-08 MED ORDER — BENZTROPINE MESYLATE 0.5 MG PO TABS: 0.5000 mg | ORAL_TABLET | Freq: Every day | ORAL | Status: DC

## 2015-11-08 MED ORDER — CITALOPRAM HYDROBROMIDE 10 MG PO TABS: 10.0000 mg | ORAL_TABLET | Freq: Every day | ORAL | Status: DC

## 2015-11-08 MED ORDER — RISPERIDONE 2 MG PO TBDP: 2.0000 mg | ORAL_TABLET | Freq: Every day | ORAL | Status: DC

## 2015-11-08 MED ORDER — CALAMINE EX LOTN: TOPICAL_LOTION | Freq: Three times a day (TID) | CUTANEOUS | Status: DC

## 2015-11-08 MED ORDER — FINASTERIDE 1 MG PO TABS: 0.2500 mg | ORAL_TABLET | Freq: Every day | ORAL | Status: DC

## 2015-11-08 MED ORDER — PREDNISONE 20 MG PO TABS: 20.0000 mg | ORAL_TABLET | Freq: Every day | ORAL | Status: DC

## 2015-11-08 MED ORDER — TRAZODONE HCL 50 MG PO TABS: 25.0000 mg | ORAL_TABLET | Freq: Every day | ORAL | Status: DC

## 2015-11-08 MED ORDER — PREDNISONE 10 MG PO TABS: ORAL_TABLET | ORAL | Status: DC

## 2015-11-08 MED ORDER — HYDROXYZINE HCL 25 MG PO TABS: 25.0000 mg | ORAL_TABLET | Freq: Four times a day (QID) | ORAL | Status: DC | PRN

## 2015-11-08 MED ORDER — PREDNISONE 10 MG PO TABS: 10.0000 mg | ORAL_TABLET | Freq: Every day | ORAL | Status: DC

## 2015-11-08 NOTE — Progress Notes (Signed)
Nutrition Education Note  RD consulted for nutrition education regarding hyperlipidemia.  Lipid Panel     Component Value Date/Time   CHOL 208* 11/07/2015 0645   TRIG 181* 11/07/2015 0645   HDL 56 11/07/2015 0645   CHOLHDL 3.7 11/07/2015 0645   VLDL 36 11/07/2015 0645   LDLCALC 116* 11/07/2015 0645    RD provided "High Triglycerides Nutrition Therapy" handout from the Academy of Nutrition and Dietetics. Reviewed patient's dietary recall. Provided examples on ways to decrease sugar and fat intake in diet. Discouraged intake of processed foods and consumption of sugar-sweetened beverages. Encouraged fresh fruits and vegetables as well as whole grain sources of carbohydrates to maximize fiber intake. Teach back method used.  Pt expresses concern that he was informed that his cholesterol was high but that he was not informed of what value was associated with this.   He states that he has a good appetite overall and has been eating well PTA and since admission.  Expect good compliance.  Body mass index is 26.95 kg/(m^2). Pt meets criteria for overweight based on current BMI.  Diet Order:   Pt is also offered choice of unit snacks mid-morning and mid-afternoon.  Pt is eating as desired.   Labs and medications reviewed. No further nutrition interventions warranted at this time. If additional nutrition issues arise, please re-consult RD.      Brad GammonJessica Kaenan Barr, RD, LDN Inpatient Clinical Dietitian Pager # [phone] After hours/weekend pager # [phone]

## 2015-11-08 NOTE — BHH Suicide Risk Assessment (Addendum)
[facility]   Demographic Factors:  Male and Caucasian  Total Time spent with patient: 30 minutes  Musculoskeletal: Strength & Muscle Tone: within normal limits Gait & Station: normal Patient leans: N/A  Psychiatric Specialty Exam: Physical Exam  Review of Systems  Psychiatric/Behavioral: Negative for depression, suicidal ideas and hallucinations. The patient is not nervous/anxious.   All other systems reviewed and are negative.   Blood pressure 122/77, pulse 73, temperature 98.2 F (36.8 C), temperature source Oral, resp. rate 18, height 6\' 2"  (1.88 m), weight 95.255 kg (210 lb), SpO2 98 %.Body mass index is 26.95 kg/(m^2).  General Appearance: Casual  Eye Contact::  Fair  Speech:  Clear and Coherent409  Volume:  Normal  Mood:  Anxious improved  Affect:  Appropriate  Thought Process:  Goal Directed  Orientation:  Full (Time, Place, and Person)  Thought Content:  WDL  Suicidal Thoughts:  No  Homicidal Thoughts:  No  Memory:  Immediate;   Fair Recent;   Fair Remote;   Fair  Judgement:  Impaired  Insight:  Shallow  Psychomotor Activity:  Normal  Concentration:  Fair  Recall:  [employer]  Fund of Knowledge:Fair  Language: Fair  Akathisia:  No  Handed:  Right  AIMS (if indicated):     Assets:  Communication Skills Desire for Improvement  Sleep:  Number of Hours: 6.75  Cognition: WNL  ADL's:  Intact   Have you used any form of tobacco in the last 30 days? (Cigarettes, Smokeless Tobacco, Cigars, and/or Pipes): No  Has this patient used any form of tobacco in the last 30 days? (Cigarettes, Smokeless Tobacco, Cigars, and/or Pipes) No  Mental Status Per Nursing Assessment::   On Admission:     Current Mental Status by Physician: Pt denies SI/HI/AH/VH  Loss Factors: Decrease in vocational status  Historical Factors: Impulsivity  Risk Reduction Factors:   Employed  Continued Clinical Symptoms:  Alcohol/Substance  Abuse/Dependencies Unstable or Poor Therapeutic Relationship  Cognitive Features That Contribute To Risk:  Polarized thinking    Suicide Risk:  Minimal: No identifiable suicidal ideation.  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 GAD (generalized anxiety disorder) [F41.1] 11/07/2015  . 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 Attention deficit hyperactivity disorder (ADHD) [F90.9] 11/06/2015  . 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 Substance-induced psychotic disorder with delusions [facility](HCC) [F19.950] 11/06/2015    Follow-up Information    Follow up with [facility].   Why:  Go to the walk-in clinic M-F between 8 and 11 for your hospital follow up appointment   Contact information:   [address] [place]  [phone]      Plan Of Care/Follow-up recommendations:  Activity:  No restrictions Diet:  regular Tests:  as needed Other:  follow up with after care  Is patient on multiple antipsychotic therapies at discharge:  No   Has Patient had three or more failed trials of antipsychotic monotherapy by history:  No  Recommended Plan for Multiple Antipsychotic Therapies: NA    Milly Goggins MD 11/08/2015, 9:28 AM

## 2015-11-08 NOTE — Progress Notes (Signed)
D: Pt presents less anxious in behavior this evening. Pt is active and visible within the milieu. Pt denies any SI/HI/AVH. Pt complains of ingestion with the intake of prednisone. Pt given Maalox for relief.  A: Writer administered scheduled and prn medications to pt, per MD orders. Continued support and availability as needed was extended to this pt. Staff continues to monitor pt with q4815min checks.  R: No adverse drug reactions noted. Pt receptive to treatment. Pt remains safe at this time.

## 2015-11-08 NOTE — Discharge Summary (Signed)
Physician Discharge Summary Note  Patient:  Brad Barr is an 39 y.o., male MRN:  [id number] DOB:  [birth year] Patient phone:  [phone] (home)  Patient address:   [address] [place] [address],  Total Time spent with patient: Greater than 30 minutes  Date of Admission:  11/03/2015 Date of Discharge: 11-08-15  Reason for Admission:  Self inflicted worund  Principal Problem: Substance-induced psychotic disorder with delusions [facility])  Discharge Diagnoses: Patient Active Problem List   Diagnosis Date Noted  . 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 GAD (generalized anxiety disorder) [F41.1] 11/07/2015  . 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 Attention deficit hyperactivity disorder (ADHD) [F90.9] 11/06/2015  . 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 Substance-induced psychotic disorder with delusions [facility]) [F19.950] 11/06/2015    Past Psychiatric History: Hx Substance induced psychotic disorder  Past Medical History: History reviewed. No pertinent past medical history. History reviewed. No pertinent past surgical history.  Family History: History reviewed. No pertinent family history.  Family Psychiatric  History: See H&P  Social History:  History  Alcohol Use  . Yes     History  Drug Use No    Social History   Social History  . Marital Status: Single    Spouse Name: N/A  . Number of Children: N/A  . Years of Education: N/A   Social History Main Topics  . Smoking status: Current Some Day Smoker  . Smokeless tobacco: None  . Alcohol Use: Yes  . Drug Use: No  . Sexual Activity: Not Asked   Other Topics Concern  . None   Social History Narrative    Hospital Course: Caucasian male, 39 years old was was evaluated for self inflicting wound to self. Patient was IVC by his mother and was brought in by [employer]. Patient did not want to answer questions asked of him this  morning but stated" You should know" Patient has bruised area bilaterally to his lower eyes and multiple superficial cuts to his right arm. When asked how he sustained his injury he stated that he was doing some repairs and hurt his eye area. IVC paper stated that his mother is concerned about his son trying hard to hurt himself and trying to injure his jaw. Patient has been screaming and cursing loudly. Patient admitted to seeing [facility] providers two years ago but does not remember the reason for being seen and what medications he was given.Patient Wanted providers to leave his room so he could sleep. He denies SI/HI/AV. He has been accepted for admission while we seek placement.  While a patient in this hospital, Brad Barr received medication management for mood stabilization. He was medicated & discharged on; on Cogentin 0.5 mg for prevention of EPS, Citalopram 10 mg for depression, Hydroxyzine 25 mg for anxiety, Risperdal 2 mg for mood stabilization & trazodone 25 mg for insomnia.  He also was enrolled and participated in the Group counseling sessions being offered and held on this unit. Brad Barr learned coping skills that should help him cope better and maintain mood stability after discharge. He received other medication management for the other medical issues presented. He tolerated his treatment regimen without any significant adverse effects or reactions reported.  During the course of his hospital stay, Brad Barr was noted to be motivated for recovery. He worked closely with the treatment team and case manager to develop a discharge plan with appropriate goals to maintain mood stability after discharge. Coping skills,  problem solving as well as relaxation therapies were also part of his unit programming. On this day of his hospital discharge, Brad RuizJohn he was in much improved condition than upon admission. His symptoms were reported as significantly decreased or resolved completely. Upon discharge, he  denies any SI/HI, AVH, delusional thoughts and or paranoia. He was motivated to continue taking medication with a goal of continued improvement in mental health. Brad Barr was discharged home with a plan to follow up care as noted below. He was provided with all the necessary information needed to make this appointment without problems. He was picked up by his family.  Physical Findings:  AIMS: Facial and Oral Movements Muscles of Facial Expression: None, normal Lips and Perioral Area: None, normal Jaw: None, normal Tongue: None, normal,Extremity Movements Upper (arms, wrists, hands, fingers): None, normal Lower (legs, knees, ankles, toes): None, normal, Trunk Movements Neck, shoulders, hips: None, normal, Overall Severity Severity of abnormal movements (highest score from questions above): None, normal Incapacitation due to abnormal movements: None, normal Patient's awareness of abnormal movements (rate only patient's report): No Awareness, Dental Status Current problems with teeth and/or dentures?: No Does patient usually wear dentures?: No  CIWA:  CIWA-Ar Total: 1 COWS:  COWS Total Score: 1  Musculoskeletal: Strength & Muscle Tone: within normal limits Gait & Station: normal Patient leans: N/A  Psychiatric Specialty Exam: Review of Systems  Constitutional: Negative.   HENT: Negative.   Eyes: Negative.   Respiratory: Negative.   Cardiovascular: Negative.   Gastrointestinal: Negative.   Genitourinary: Negative.   Musculoskeletal: Negative.   Skin: Negative.   Neurological: Negative.   Endo/Heme/Allergies: Negative.   Psychiatric/Behavioral: Positive for depression (Stable) and substance abuse (Hx polysubstance dependence). Negative for suicidal ideas, hallucinations and memory loss. The patient has insomnia (Stable). The patient is not nervous/anxious.     Blood pressure 122/77, pulse 73, temperature 98.2 F (36.8 C), temperature source Oral, resp. rate 18, height 6\' 2"  (1.88 m),  weight 95.255 kg (210 lb), SpO2 98 %.Body mass index is 26.95 kg/(m^2).  See Md's SRA   Have you used any form of tobacco in the last 30 days? (Cigarettes, Smokeless Tobacco, Cigars, and/or Pipes): No  Has this patient used any form of tobacco in the last 30 days? (Cigarettes, Smokeless Tobacco, Cigars, and/or Pipes) Yes, No  Metabolic Disorder Labs:  Lab Results  Component Value Date   HGBA1C 5.5 11/07/2015   MPG 111 11/07/2015   Lab Results  Component Value Date   PROLACTIN 63.1* 11/07/2015   Lab Results  Component Value Date   CHOL 208* 11/07/2015   TRIG 181* 11/07/2015   HDL 56 11/07/2015   CHOLHDL 3.7 11/07/2015   VLDL 36 11/07/2015   LDLCALC 116* 11/07/2015    See Psychiatric Specialty Exam and Suicide Risk Assessment completed by Attending Physician prior to discharge.  Discharge destination:  Home  Is patient on multiple antipsychotic therapies at discharge:  No   Has Patient had three or more failed trials of antipsychotic monotherapy by history:  No  Recommended Plan for Multiple Antipsychotic Therapies: NA    Medication List    STOP taking these medications        finasteride 5 MG tablet  Commonly known as:  PROSCAR     ibuprofen 200 MG tablet  Commonly known as:  ADVIL,MOTRIN      TAKE these medications      Indication   benztropine 0.5 MG tablet  Commonly known as:  COGENTIN  Take 1 tablet (  0.5 mg total) by mouth at bedtime. For prevention of EPS   Indication:  Extrapyramidal Reaction caused by Medications     calamine lotion  Apply topically 3 (three) times daily. For skin rashes   Indication:  Skin rashes     citalopram 10 MG tablet  Commonly known as:  CELEXA  Take 1 tablet (10 mg total) by mouth daily. For depression   Indication:  Depression     finasteride 1 MG tablet  Commonly known as:  PROPECIA  Take 0.5 tablets (0.5 mg total) by mouth daily. For hair growth   Indication:  Male Pattern Baldness     hydrOXYzine 25 MG tablet   Commonly known as:  ATARAX/VISTARIL  Take 1 tablet (25 mg total) by mouth every 6 (six) hours as needed for itching or anxiety.   Indication:  Anxiety     predniSONE 10 MG tablet  Commonly known as:  DELTASONE  Take 3 tablets (30 mg total) by mouth daily with breakfast. For inflammation  Start taking on:  11/09/2015   Indication:  Inflammation     predniSONE 20 MG tablet  Commonly known as:  DELTASONE  Take 1 tablet (20 mg total) by mouth daily with breakfast. For inflammation  Start taking on:  11/10/2015   Indication:  Inflammation     predniSONE 10 MG tablet  Commonly known as:  DELTASONE  Take 1 tablet (10 mg total) by mouth daily with breakfast. For inflammation  Start taking on:  11/11/2015   Indication:  Infalammation     risperiDONE 2 MG disintegrating tablet  Commonly known as:  RISPERDAL M-TABS  Take 1 tablet (2 mg total) by mouth at bedtime. For mood control   Indication:  Mood control     traZODone 50 MG tablet  Commonly known as:  DESYREL  Take 0.5 tablets (25 mg total) by mouth at bedtime. For sleep   Indication:  Trouble Sleeping       Follow-up Information    Follow up with [facility].   Why:  Go to the walk-in clinic M-F between 8 and 11 for your hospital follow up appointment   Contact information:   [address] [place]  [phone]     Follow-up recommendations: Activity:  As tolerated Diet: As recommended by your primary care doctor. Keep all scheduled follow-up appointments as recommended.   Comments: Take all your medications as prescribed by your mental healthcare provider. Report any adverse effects and or reactions from your medicines to your outpatient provider promptly. Patient is instructed and cautioned to not engage in alcohol and or illegal drug use while on prescription medicines. In the event of worsening symptoms, patient is instructed to call the crisis hotline, 911 and or go to the nearest ED for appropriate evaluation and  treatment of symptoms. Follow-up with your primary care provider for your other medical issues, concerns and or health care needs.   Signed: Sanjuana Kava, PMHNP, FNP-BC 11/08/2015, 12:00 PM

## 2015-11-08 NOTE — Progress Notes (Signed)
Brad Barr's mother called and wanted to talk with Dr. Elna BreslowEappen.  No ROI for his mother and talked with Brad RuizJohn who declined to give consent.  Brad Barr stated that he didn't want his mother to pick him up and "I want to leave on my own."  Spoke with Dr. Elna BreslowEappen and Rod, SW, to discuss options for leaving.  Bus pass given.  Brad Barr was D/C from the unit with bus ticket.  He was pleasant and cooperative. He denies SI/HI or A/V hallucinations.  Alert and oriented X 3.  Affect bright.  D/C follow up paperwork reviewed with pt and copy sent as well as prescriptions.  Belongings ( from locker 20-shoes, phone ) returned to pt. Q 15 min checks maintained until discharge.  He denies any physical complaints and left the unit in no apparent distress.

## 2015-11-08 NOTE — Progress Notes (Signed)
  [facility] :  Will you be returning to the same living situation after discharge:  Yes,  home At discharge, do you have transportation home?: Yes,  family Do you have the ability to pay for your medications: Yes,  mental health  Release of information consent forms completed and in the chart;  Patient's signature needed at discharge.  Patient to Follow up at: Follow-up Information    Follow up with [facility].   Why:  Go to the walk-in clinic M-F between 8 and 11 for your hospital follow up appointment   Contact information:   [address] [place]  [phone]      Next level of care provider has [facility] Link: unknown  Patient denies SI/HI: Yes,  yes    Safety Planning and Suicide Prevention discussed: Yes,  yes  Have you used any form of tobacco in the last 30 days? (Cigarettes, Smokeless Tobacco, Cigars, and/or Pipes): No  Has patient been referred to the Quitline?: N/A patient is not a smoker  [place], Olie Dibert B 11/08/2015, 10:39 AM

## 2015-11-24 ENCOUNTER — Encounter (HOSPITAL_COMMUNITY): Payer: Self-pay | Admitting: Emergency Medicine

## 2015-11-24 ENCOUNTER — Emergency Department (HOSPITAL_COMMUNITY)
Admission: EM | Admit: 2015-11-24 | Discharge: 2015-11-28 | Disposition: A | Discharge status: Other Acute Inpatient Hospital | Payer: Self-pay | Attending: Emergency Medicine | Admitting: Emergency Medicine

## 2015-11-24 ENCOUNTER — Encounter (HOSPITAL_COMMUNITY): Payer: Self-pay | Admitting: Oncology

## 2015-11-24 ENCOUNTER — Emergency Department (HOSPITAL_COMMUNITY)
Admission: EM | Admit: 2015-11-24 | Discharge: 2015-11-24 | Disposition: A | Discharge status: Home / Self Care | Payer: Self-pay | Attending: Emergency Medicine | Admitting: Emergency Medicine

## 2015-11-24 DIAGNOSIS — Y9289 Other specified places as the place of occurrence of the external cause: Secondary | ICD-10-CM | POA: Insufficient documentation

## 2015-11-24 DIAGNOSIS — Z7289 Other problems related to lifestyle: Secondary | ICD-10-CM

## 2015-11-24 DIAGNOSIS — F17200 Nicotine dependence, unspecified, uncomplicated: Secondary | ICD-10-CM | POA: Insufficient documentation

## 2015-11-24 DIAGNOSIS — Y9389 Activity, other specified: Secondary | ICD-10-CM | POA: Insufficient documentation

## 2015-11-24 DIAGNOSIS — F23 Brief psychotic disorder: Secondary | ICD-10-CM | POA: Insufficient documentation

## 2015-11-24 DIAGNOSIS — Y998 Other external cause status: Secondary | ICD-10-CM | POA: Insufficient documentation

## 2015-11-24 DIAGNOSIS — S61011A Laceration without foreign body of right thumb without damage to nail, initial encounter: Secondary | ICD-10-CM | POA: Insufficient documentation

## 2015-11-24 DIAGNOSIS — W25XXXA Contact with sharp glass, initial encounter: Secondary | ICD-10-CM | POA: Insufficient documentation

## 2015-11-24 DIAGNOSIS — Z79899 Other long term (current) drug therapy: Secondary | ICD-10-CM | POA: Insufficient documentation

## 2015-11-24 DIAGNOSIS — IMO0002 Reserved for concepts with insufficient information to code with codable children: Secondary | ICD-10-CM

## 2015-11-24 DIAGNOSIS — Z7952 Long term (current) use of systemic steroids: Secondary | ICD-10-CM | POA: Insufficient documentation

## 2015-11-24 DIAGNOSIS — F1510 Other stimulant abuse, uncomplicated: Secondary | ICD-10-CM | POA: Diagnosis present

## 2015-11-24 DIAGNOSIS — F19950 Other psychoactive substance use, unspecified with psychoactive substance-induced psychotic disorder with delusions: Secondary | ICD-10-CM | POA: Diagnosis present

## 2015-11-24 DIAGNOSIS — S61011D Laceration without foreign body of right thumb without damage to nail, subsequent encounter: Secondary | ICD-10-CM | POA: Insufficient documentation

## 2015-11-24 DIAGNOSIS — F419 Anxiety disorder, unspecified: Secondary | ICD-10-CM | POA: Insufficient documentation

## 2015-11-24 DIAGNOSIS — X789XXD Intentional self-harm by unspecified sharp object, subsequent encounter: Secondary | ICD-10-CM | POA: Insufficient documentation

## 2015-11-24 HISTORY — DX: Attention-deficit hyperactivity disorder, unspecified type: F90.9

## 2015-11-24 HISTORY — DX: Schizophrenia, unspecified: F20.9

## 2015-11-24 LAB — RAPID URINE DRUG SCREEN, HOSP PERFORMED
Amphetamines: POSITIVE — AB
BARBITURATES: NOT DETECTED
BENZODIAZEPINES: NOT DETECTED
COCAINE: NOT DETECTED
Opiates: NOT DETECTED
TETRAHYDROCANNABINOL: NOT DETECTED

## 2015-11-24 LAB — CBC
HEMATOCRIT: 43.2 % (ref 39.0–52.0)
HEMOGLOBIN: 14.5 g/dL (ref 13.0–17.0)
MCH: 28.5 pg (ref 26.0–34.0)
MCHC: 33.6 g/dL (ref 30.0–36.0)
MCV: 85 fL (ref 78.0–100.0)
Platelets: 215 10*3/uL (ref 150–400)
RBC: 5.08 MIL/uL (ref 4.22–5.81)
RDW: 13.5 % (ref 11.5–15.5)
WBC: 8.8 10*3/uL (ref 4.0–10.5)

## 2015-11-24 LAB — ACETAMINOPHEN LEVEL

## 2015-11-24 LAB — COMPREHENSIVE METABOLIC PANEL
ALBUMIN: 4.4 g/dL (ref 3.5–5.0)
ALT: 26 U/L (ref 17–63)
ANION GAP: 7 (ref 5–15)
AST: 18 U/L (ref 15–41)
Alkaline Phosphatase: 62 U/L (ref 38–126)
BUN: 15 mg/dL (ref 6–20)
CO2: 25 mmol/L (ref 22–32)
Calcium: 9.1 mg/dL (ref 8.9–10.3)
Chloride: 107 mmol/L (ref 101–111)
Creatinine, Ser: 0.68 mg/dL (ref 0.61–1.24)
GFR calc Af Amer: >60 mL/min (ref >60)
GFR calc non Af Amer: >60 mL/min (ref >60)
GLUCOSE: 97 mg/dL (ref 65–99)
POTASSIUM: 4 mmol/L (ref 3.5–5.1)
SODIUM: 139 mmol/L (ref 135–145)
Total Bilirubin: 0.7 mg/dL (ref 0.3–1.2)
Total Protein: 7.3 g/dL (ref 6.5–8.1)

## 2015-11-24 LAB — SALICYLATE LEVEL: Salicylate Lvl: <4 mg/dL (ref 2.8–30.0)

## 2015-11-24 LAB — ETHANOL: Alcohol, Ethyl (B): <5 mg/dL (ref <5)

## 2015-11-24 MED ORDER — CITALOPRAM HYDROBROMIDE 10 MG PO TABS
10.0000 mg | ORAL_TABLET | Freq: Every day | ORAL | Status: DC
  Filled 2015-11-24 (×4): qty 1

## 2015-11-24 MED ORDER — TRAZODONE HCL 50 MG PO TABS: 25.0000 mg | ORAL_TABLET | Freq: Every day | ORAL | Status: DC

## 2015-11-24 MED ORDER — HYDROXYZINE HCL 25 MG PO TABS: 25.0000 mg | ORAL_TABLET | Freq: Four times a day (QID) | ORAL | Status: DC | PRN

## 2015-11-24 MED ORDER — BACITRACIN ZINC 500 UNIT/GM EX OINT
TOPICAL_OINTMENT | CUTANEOUS | Status: AC
  Filled 2015-11-24: qty 0.9

## 2015-11-24 MED ORDER — RISPERIDONE 2 MG PO TBDP
2.0000 mg | ORAL_TABLET | Freq: Every day | ORAL | Status: DC
  Filled 2015-11-24 (×4): qty 1

## 2015-11-24 MED ORDER — BENZTROPINE MESYLATE 1 MG PO TABS: 0.5000 mg | ORAL_TABLET | Freq: Every day | ORAL | Status: DC

## 2015-11-24 MED ORDER — LIDOCAINE HCL 2 % IJ SOLN
10.0000 mL | Freq: Once | INTRAMUSCULAR | Status: DC
Start: 2015-11-24 — End: 2015-11-24
  Filled 2015-11-24: qty 20

## 2015-11-24 NOTE — ED Provider Notes (Signed)
CSN: [id number]     Arrival date & time 11/24/15  1628 History  By signing my name below, I, Brad Barr, attest that this documentation has been prepared under the direction and in the presence of Elizabeth Sauer, PA-C Electronically Signed: Soijett Barr, ED Scribe. 11/24/2015. 6:01 PM.    Chief Complaint  Patient presents with  . finger laceration       The history is provided by the patient. No language interpreter was used.    Brad Barr is a 39 y.o. male who presents to the Emergency Department complaining of right thumb laceration onset 4.5 hours ago. He states that he was trying to fix a broken window when his right thumb got cut on the glass. He states that he is having associated symptoms of mild right thumb swelling and pain. He states that he has not tried any medications for the relief for his symptoms. He denies color change, and any other symptoms. Denies taking blood thinners at this time. Denies allergies to any medications.    History reviewed. No pertinent past medical history. History reviewed. No pertinent past surgical history. No family history on file. Social History  Substance Use Topics  . Smoking status: Current Some Day Smoker  . Smokeless tobacco: None  . Alcohol Use: Yes    Review of Systems  Musculoskeletal: Positive for joint swelling. Negative for myalgias and arthralgias.  Skin: Positive for wound (laceration to right thumb). Negative for color change.      Allergies  Review of patient's allergies indicates no known allergies.  Home Medications   Prior to Admission medications   Medication Sig Start Date End Date Taking? Authorizing Provider  benztropine (COGENTIN) 0.5 MG tablet Take 1 tablet (0.5 mg total) by mouth at bedtime. For prevention of EPS 11/08/15   Sanjuana Kava, NP  calamine lotion Apply topically 3 (three) times daily. For skin rashes 11/08/15   Sanjuana Kava, NP  citalopram (CELEXA) 10 MG tablet Take 1 tablet (10 mg total) by  mouth daily. For depression 11/08/15   Sanjuana Kava, NP  finasteride (PROPECIA) 1 MG tablet Take 0.5 tablets (0.5 mg total) by mouth daily. For hair growth 11/08/15   Sanjuana Kava, NP  hydrOXYzine (ATARAX/VISTARIL) 25 MG tablet Take 1 tablet (25 mg total) by mouth every 6 (six) hours as needed for itching or anxiety. 11/08/15   Sanjuana Kava, NP  predniSONE (DELTASONE) 10 MG tablet Take 3 tablet ( )  in the am 11/09/15,  Then 2 tablet ( ) in am on 11/10/15, then 1 tablet ( ) in am 12/3 then stop 11/08/15   Jomarie Longs, MD  risperiDONE (RISPERDAL M-TABS) 2 MG disintegrating tablet Take 1 tablet (2 mg total) by mouth at bedtime. For mood control 11/08/15   Sanjuana Kava, NP  traZODone (DESYREL) 50 MG tablet Take 0.5 tablets (25 mg total) by mouth at bedtime. For sleep 11/08/15   Sanjuana Kava, NP   BP 129/77 mmHg  Pulse 77  Temp(Src) 97.9 F (36.6 C) (Oral)  Resp 16  SpO2 100% Physical Exam  Constitutional: He is oriented to person, place, and time. He appears well-developed and well-nourished. No distress.  HENT:  Head: Normocephalic and atraumatic.  Eyes: EOM are normal.  Neck: Neck supple.  Cardiovascular: Normal rate.   Pulmonary/Chest: Effort normal. No respiratory distress.  Abdominal: Soft. There is no tenderness.  Musculoskeletal: Normal range of motion.  Neurological: He is alert and oriented to person, place, and time.  Skin: Skin is warm and dry. Laceration noted.  Laceration 2.5 cm with flap  Psychiatric: He has a normal mood and affect. His behavior is normal.  Nursing note and vitals reviewed.   ED Course  Procedures (including critical care time) DIAGNOSTIC STUDIES: Oxygen Saturation is 100% on RA, nl by my interpretation.    COORDINATION OF CARE: 5:57 PM Discussed treatment plan with pt at bedside which includes laceration repair and pt agreed to plan.  5:57 PM- pt is unsure of last known tetanus immunization and he refused tdap immunization today.    LACERATION REPAIR PROCEDURE NOTE The patient's identification was confirmed and consent was obtained. This procedure was performed by Elizabeth SauerJaime Sebron Mcmahill, PA-C at 6:48 PM. Site: Right thumb Anesthetic used (type and amt): lidocaine 2 ml Suture type/size: 5-0  Length: 2.5cm  # of Sutures: 5  Technique:  simple interrupted Complexity: simple  Antibx ointment applied: yes Tetanus UTD or ordered: No - patient denied tdap Site anesthetized, irrigated with NS, explored without evidence of foreign body, wound well approximated, site covered with dry, sterile dressing.  Patient tolerated procedure well without complications. Instructions for care discussed verbally and patient provided with additional written instructions for homecare and f/u.   Labs Review Labs Reviewed - No data to display  Imaging Review No results found.    EKG Interpretation None      MDM   Final diagnoses:  Thumb laceration, right, initial encounter   Rexene EdisonJohn N Sekula presents with laceration to thumb.  A&P:  Laceration  - Repaired in ED   - Wound dressed with abx ointment  - Home care instructions and return precautions given; PCP follow up PRN    I personally performed the services described in this documentation, which was scribed in my presence. The recorded information has been reviewed and is accurate.   [facility]angeJaime Pilcher Heydy Montilla, PA-C 11/24/15 1848  Alvira MondayErin Schlossman, MD 11/26/15 [id number]

## 2015-11-24 NOTE — ED Notes (Signed)
Pt unsure of when his last tetanus shot was. Pt refused to get tetanus shot on this visit.

## 2015-11-24 NOTE — Discharge Instructions (Signed)
1. Medications: Tylenol or ibuprofen for pain, usual home medications 2. Treatment: ice for swelling, keep wound clean with warm soap and water and keep bandage dry, do not submerge in water for 24 hours 3. Follow Up: Please return in 10 days to have your stitches removed or sooner if you have concerns. Return to the emergency department for increased redness, drainage of pus from the wound   WOUND CARE  Keep area clean and dry for 24 hours. Do not remove bandage, if applied.  After 24 hours,you should change it at least once a day. Also, change the dressing if it becomes wet or dirty, or as directed by your caregiver.   Wash the wound with soap and water 2 times a day. Rinse the wound off with water to remove all soap. Pat the wound dry with a clean towel.   You may shower as usual after the first 24 hours. Do not soak the wound in water until the sutures are removed.   Once the wound has healed, scarring can be minimized by covering the wound with sunscreen during the day for 1 full year.  Do not apply any ointments or creams to the wound while stitches/staples are in place, as this may cause delayed healing.  Return if you experience any of the following signs of infection: Swelling, redness, pus drainage, streaking, fever >101.0 F  Return if you experience excessive bleeding that does not stop after 15-20 minutes of constant, firm pressure.  If you did not receive a tetanus shot today because you thought you were up to date, but did not recall when your last one was given, make sure to check with your primary caregiver to determine if you need one.

## 2015-11-24 NOTE — ED Notes (Signed)
Per pt, states he cut right thumb on broken class, needs stiches

## 2015-11-24 NOTE — ED Notes (Signed)
Pt's father reported to this writer that pt is non compliant w/ his schizophrenia medications.  Per pt's father pt has episodes where he convulses, tenses up and begins growling and talking in a different voice saying, "We will kill all of you."  Also pt's father rents an apartment for pt as he is afraid for his and his family's safety.  Pt's father reports that pt has smashed all the windows in the apartment as well as broken two doors and smashed all of his electronics.  Dr. Effie ShyWentz notified of conversation w/ pt's father.

## 2015-11-24 NOTE — ED Notes (Signed)
Per IVC paperwork pt is a danger to self and others.  Pt IVC'd in October.  Pt is delusional, claiming to be [employer], [employer] and [employer].  Pt is cutting himself.  IVC paperwork also claims that pt has threatened to come and destroy property of petitioner.

## 2015-11-24 NOTE — ED Provider Notes (Signed)
CSN: [id number]     Arrival date & time 11/24/15  2034 History   First MD Initiated Contact with Patient 11/24/15 2227     Chief Complaint  Patient presents with  . Delusional    IVC     (Consider location/radiation/quality/duration/timing/severity/associated sxs/prior Treatment) HPI   Brad Barr is a 39 y.o. male who is here under involuntary commitment for evaluation of delusional behavior. He was here earlier with a cut on his right thumb, which was sutured. While he was here, police officers informed us that his father would be putting him under commitment. His father actually came to the emergency department, and drove the patient home. After that, commitment papers were served on the patient, and he was brought back here. Patient's father is the practitioner. He states that the patient is self injury, punching himself, stating that he is "sanctioned by the [employer]", and that he "works for the [employer], [employer], and the [employer]." The patient is reported to have "punched-out car windows." He has also threatened to "destroy property of his father." Patient states that he was admitted at [facility], for psychiatric reasons, 2 weeks ago, and that he was discharged without medication and told to follow-up with a family practice doctor. He states that he was not given any psychiatric medications. Patient states I don't have psychiatric problems". Patient denies any recent illnesses. He states he hurt his thumb, on a window. He denies intentional self-inflicted injury. There are no other known modifying factors.   Past Medical History  Diagnosis Date  . Schizophrenia (HCC)   . ADHD (attention deficit hyperactivity disorder)    History reviewed. No pertinent past surgical history. No family history on file. Social History  Substance Use Topics  . Smoking status: Current Some Day Smoker  . Smokeless tobacco: None  . Alcohol Use: Yes    Review of Systems  All other systems reviewed and are  negative.     Allergies  Review of patient's allergies indicates no known allergies.  Home Medications   Prior to Admission medications   Medication Sig Start Date End Date Taking? Authorizing Provider  benztropine (COGENTIN) 0.5 MG tablet Take 1 tablet (0.5 mg total) by mouth at bedtime. For prevention of EPS 11/08/15   Sanjuana KavaAgnes I Nwoko, NP  calamine lotion Apply topically 3 (three) times daily. For skin rashes 11/08/15   Sanjuana KavaAgnes I Nwoko, NP  citalopram (CELEXA) 10 MG tablet Take 1 tablet (10 mg total) by mouth daily. For depression 11/08/15   Sanjuana KavaAgnes I Nwoko, NP  finasteride (PROPECIA) 1 MG tablet Take 0.5 tablets (0.5 mg total) by mouth daily. For hair growth 11/08/15   Sanjuana KavaAgnes I Nwoko, NP  hydrOXYzine (ATARAX/VISTARIL) 25 MG tablet Take 1 tablet (25 mg total) by mouth every 6 (six) hours as needed for itching or anxiety. 11/08/15   Sanjuana KavaAgnes I Nwoko, NP  predniSONE (DELTASONE) 10 MG tablet Take 3 tablet (30mg )  in the am 11/09/15,  Then 2 tablet (20mg ) in am on 11/10/15, then 1 tablet (10mg ) in am 12/3 then stop 11/08/15   Jomarie LongsSaramma Eappen, MD  risperiDONE (RISPERDAL M-TABS) 2 MG disintegrating tablet Take 1 tablet (2 mg total) by mouth at bedtime. For mood control 11/08/15   Sanjuana KavaAgnes I Nwoko, NP  traZODone (DESYREL) 50 MG tablet Take 0.5 tablets (25 mg total) by mouth at bedtime. For sleep 11/08/15   Sanjuana KavaAgnes I Nwoko, NP   BP 128/81 mmHg  Pulse 68  Temp(Src) 98.2 F (36.8 C) (Oral)  Resp 21  SpO2 97% Physical Exam  Constitutional: He is oriented to person, place, and time. He appears well-developed and well-nourished. No distress.  HENT:  Head: Normocephalic and atraumatic.  Right Ear: External ear normal.  Left Ear: External ear normal.  Eyes: Conjunctivae and EOM are normal. Pupils are equal, round, and reactive to light.  Neck: Normal range of motion and phonation normal. Neck supple.  Cardiovascular: Normal rate, regular rhythm and normal heart sounds.   Pulmonary/Chest: Effort normal and  breath sounds normal. He exhibits no bony tenderness.  Abdominal: Soft. There is no tenderness.  Musculoskeletal: Normal range of motion.  Neurological: He is alert and oriented to person, place, and time. No cranial nerve deficit or sensory deficit. He exhibits normal muscle tone. Coordination normal.  Skin: Skin is warm, dry and intact.  Psychiatric: His behavior is normal. Judgment and thought content normal.  Anxious. He is not responding to internal stimuli.  Nursing note and vitals reviewed.   ED Course  Procedures (including critical care time)  Medications - No data to display  Patient Vitals for the past 24 hrs:  BP Temp Temp src Pulse Resp SpO2  11/24/15 2043 128/81 mmHg 98.2 F (36.8 C) Oral 68 21 97 %   TTS consultation     Labs Review Labs Reviewed  ACETAMINOPHEN LEVEL - Abnormal; Notable for the following:    Acetaminophen (Tylenol), Serum <10 (*)    All other components within normal limits  COMPREHENSIVE METABOLIC PANEL  ETHANOL  SALICYLATE LEVEL  CBC  URINE RAPID DRUG SCREEN, HOSP PERFORMED    Imaging Review No results found. I have personally reviewed and evaluated these images and lab results as part of my medical decision-making.   EKG Interpretation None      MDM   Final diagnoses:  Acute psychosis  Self-inflicted injury  Thumb laceration, right, subsequent encounter    The patient is evidencing recurrent signs of intentional self injury, has poor insight, and is atrisk for suicide. He is confabulating, reluctant to give accurate history, and has been committed by his father. He will require evaluation by psychiatry, prior to discharge. I have upheldnvoluntary commitment, by signing the first opinion paperwork.  Nursing Notes Reviewed/ Care Coordinated, and agree without changes. Applicable Imaging Reviewed.  Interpretation of Laboratory Data incorporated into ED treatment  Plan: As per TTS in conjunction with oncoming provider  team.    Mancel Bale, MD 11/24/15 2325

## 2015-11-24 NOTE — ED Notes (Signed)
Pt given food and drink.

## 2015-11-25 DIAGNOSIS — F1510 Other stimulant abuse, uncomplicated: Secondary | ICD-10-CM | POA: Diagnosis present

## 2015-11-25 DIAGNOSIS — F19950 Other psychoactive substance use, unspecified with psychoactive substance-induced psychotic disorder with delusions: Secondary | ICD-10-CM

## 2015-11-25 MED ORDER — TRAZODONE HCL 50 MG PO TABS: 50.0000 mg | ORAL_TABLET | Freq: Every day | ORAL | Status: DC

## 2015-11-25 NOTE — Progress Notes (Signed)
D Pt. Denies SI and HI, no complaints of pain or discomfort noted at present time. Pt. Also denies A and VH.  A Writer offered support and encouragement.  R Pt. Refuses his HS medications, states he was told to quit taking them.  Pt. Has been in the bed most of this pm.  Pt. Has been calm and cooperative with the exception of being noncompliant.  Pt. Remains safe on the unit.

## 2015-11-25 NOTE — ED Notes (Signed)
Patient refused antidepressant medication.  Patient states, "I'm no sick.  I don't take medication and I'm not depressed!"  Patient has been refusing meds since his arrival in [facility].

## 2015-11-25 NOTE — ED Notes (Signed)
L thumb wound cleaned and redressed.  No redness,drainage, or edema.

## 2015-11-25 NOTE — Consult Note (Signed)
[facility] Consult   Reason for Consult:  Violence towards himself, psychossi Referring Physician:  EDP Patient Identification: Brad Barr MRN:  [id number] Principal Diagnosis: Substance-induced psychotic disorder with delusions [facility]) Diagnosis:   Patient Active Problem List   Diagnosis Date Noted  . Amphetamine abuse [F15.10] 11/25/2015    Priority: High  . Substance-induced psychotic disorder with delusions [facility]) [F19.950] 11/06/2015    Priority: High  . 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 GAD (generalized anxiety disorder) [F41.1] 11/07/2015  . 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 Attention deficit hyperactivity disorder (ADHD) [F90.9] 11/06/2015  . 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 male patient admitted with psychosis.  HPI:  On admission:  39 y.o. male who is here under involuntary commitment for evaluation of delusional behavior. He was here earlier with a cut on his right thumb, which was sutured. While he was here, police officers informed us that his father would be putting him under commitment. His father actually came to the emergency department, and drove the patient home. After that, commitment papers were served on the patient, and he was brought back here. Patient's father is the practitioner. He states that the patient is self injury, punching himself, stating that he is "sanctioned by the [facility]", and that he "works for the [employer], [employer], and the [place]." The patient is reported to have "punched-out car windows." He has also threatened to "destroy property of his father." Patient states that he was admitted at [facility], for psychiatric reasons, 2 weeks ago, and that he was discharged without medication and told to follow-up with a family practice doctor. He states that he was not given  any psychiatric medications. Patient states I don't have psychiatric problems". Patient denies any recent illnesses. He states he hurt his thumb, on a window. He denies intentional self-inflicted injury. There are no other known modifying factors.  Today:  Patient cooperative, denial about the accusations but continues to abuse drugs and his parents report delusions and destruction of property and self.  Past Psychiatric History: Polysubstance abuse  Risk to Self: Is patient at risk for suicide?: No, but patient needs Medical Clearance Risk to Others:   Prior Inpatient Therapy:   Prior Outpatient Therapy:    Past Medical History:  Past Medical History  Diagnosis Date  . Schizophrenia ([place])   . ADHD (attention deficit hyperactivity disorder)    History reviewed. No pertinent past surgical history. Family History: No family history on file. Family Psychiatric  History: Unknown Social History:  History  Alcohol Use  . Yes     History  Drug Use No    Social History   Social History  . Marital Status: Single    Spouse Name: N/A  . Number of Children: N/A  . Years of Education: N/A   Social History Main Topics  . Smoking status: Current Some Day Smoker  . Smokeless tobacco: None  . Alcohol Use: Yes  . Drug Use: No  . Sexual Activity: Not Asked   Other Topics Concern  . None   Social History Narrative   Additional Social History:                          Allergies:  No Known Allergies  Labs:  Results for orders placed or  performed during the hospital encounter of 11/24/15 (from the past 48 hour(s))  Comprehensive metabolic panel     Status: None   Collection Time: 11/24/15  9:34 PM  Result Value Ref Range   Sodium 139 135 - 145 mmol/L   Potassium 4.0 3.5 - 5.1 mmol/L   Chloride 107 101 - 111 mmol/L   CO2 25 22 - 32 mmol/L   Glucose, Bld 97 65 - 99 mg/dL   BUN 15 6 - 20 mg/dL   Creatinine, Ser 0.68 0.61 - 1.24 mg/dL   Calcium 9.1 8.9 - 10.3 mg/dL    Total Protein 7.3 6.5 - 8.1 g/dL   Albumin 4.4 3.5 - 5.0 g/dL   AST 18 15 - 41 U/L   ALT 26 17 - 63 U/L   Alkaline Phosphatase 62 38 - 126 U/L   Total Bilirubin 0.7 0.3 - 1.2 mg/dL   GFR calc non Af Amer >60 >60 mL/min   GFR calc Af Amer >60 >60 mL/min    Comment: (NOTE) The eGFR has been calculated using the CKD EPI equation. This calculation has not been validated in all clinical situations. eGFR's persistently <60 mL/min signify possible Chronic Kidney Disease.    Anion gap 7 5 - 15  Ethanol (ETOH)     Status: None   Collection Time: 11/24/15  9:34 PM  Result Value Ref Range   Alcohol, Ethyl (B) <5 <5 mg/dL    Comment:        LOWEST DETECTABLE LIMIT FOR SERUM ALCOHOL IS 5 mg/dL FOR MEDICAL PURPOSES ONLY   Salicylate level     Status: None   Collection Time: 11/24/15  9:34 PM  Result Value Ref Range   Salicylate Lvl <3.3 2.8 - 30.0 mg/dL  Acetaminophen level     Status: Abnormal   Collection Time: 11/24/15  9:34 PM  Result Value Ref Range   Acetaminophen (Tylenol), Serum <10 (L) 10 - 30 ug/mL    Comment:        THERAPEUTIC CONCENTRATIONS VARY SIGNIFICANTLY. A RANGE OF 10-30 ug/mL MAY BE AN EFFECTIVE CONCENTRATION FOR MANY PATIENTS. HOWEVER, SOME ARE BEST TREATED AT CONCENTRATIONS OUTSIDE THIS RANGE. ACETAMINOPHEN CONCENTRATIONS >150 ug/mL AT 4 HOURS AFTER INGESTION AND >50 ug/mL AT 12 HOURS AFTER INGESTION ARE OFTEN ASSOCIATED WITH TOXIC REACTIONS.   CBC     Status: None   Collection Time: 11/24/15  9:34 PM  Result Value Ref Range   WBC 8.8 4.0 - 10.5 K/uL   RBC 5.08 4.22 - 5.81 MIL/uL   Hemoglobin 14.5 13.0 - 17.0 g/dL   HCT 43.2 39.0 - 52.0 %   MCV 85.0 78.0 - 100.0 fL   MCH 28.5 26.0 - 34.0 pg   MCHC 33.6 30.0 - 36.0 g/dL   RDW 13.5 11.5 - 15.5 %   Platelets 215 150 - 400 K/uL  Urine rapid drug screen (hosp performed) (Not at [facility])     Status: Abnormal   Collection Time: 11/24/15  9:57 PM  Result Value Ref Range   Opiates NONE DETECTED NONE DETECTED    Cocaine NONE DETECTED NONE DETECTED   Benzodiazepines NONE DETECTED NONE DETECTED   Amphetamines POSITIVE (A) NONE DETECTED   Tetrahydrocannabinol NONE DETECTED NONE DETECTED   Barbiturates NONE DETECTED NONE DETECTED    Comment:        DRUG SCREEN FOR MEDICAL PURPOSES ONLY.  IF CONFIRMATION IS NEEDED FOR ANY PURPOSE, NOTIFY LAB WITHIN 5 DAYS.        LOWEST DETECTABLE  LIMITS FOR URINE DRUG SCREEN Drug Class       Cutoff (ng/mL) Amphetamine      1000 Barbiturate      200 Benzodiazepine   967 Tricyclics       591 Opiates          300 Cocaine          300 THC              50     Current Facility-Administered Medications  Medication Dose Route Frequency Provider Last Rate Last Dose  . benztropine (COGENTIN) tablet 0.5 mg  0.5 mg Oral QHS Daleen Bo, MD   0.5 mg at 11/24/15 2330  . citalopram (CELEXA) tablet 10 mg  10 mg Oral Daily Daleen Bo, MD   10 mg at 11/24/15 2330  . hydrOXYzine (ATARAX/VISTARIL) tablet 25 mg  25 mg Oral Q6H PRN Daleen Bo, MD      . risperiDONE (RISPERDAL M-TABS) disintegrating tablet 2 mg  2 mg Oral QHS Daleen Bo, MD   2 mg at 11/24/15 2331  . traZODone (DESYREL) tablet 25 mg  25 mg Oral QHS Daleen Bo, MD   25 mg at 11/24/15 2331   Current Outpatient Prescriptions  Medication Sig Dispense Refill  . benztropine (COGENTIN) 0.5 MG tablet Take 1 tablet (0.5 mg total) by mouth at bedtime. For prevention of EPS 30 tablet 0  . calamine lotion Apply topically 3 (three) times daily. For skin rashes 120 mL 0  . citalopram (CELEXA) 10 MG tablet Take 1 tablet (10 mg total) by mouth daily. For depression 30 tablet 0  . finasteride (PROPECIA) 1 MG tablet Take 0.5 tablets (0.5 mg total) by mouth daily. For hair growth    . hydrOXYzine (ATARAX/VISTARIL) 25 MG tablet Take 1 tablet (25 mg total) by mouth every 6 (six) hours as needed for itching or anxiety. 45 tablet 0  . predniSONE (DELTASONE) 10 MG tablet Take 3 tablet (31m)  in the am 11/09/15,  Then 2  tablet (290m in am on 11/10/15, then 1 tablet (1077min am 12/3 then stop 6 tablet 0  . risperiDONE (RISPERDAL M-TABS) 2 MG disintegrating tablet Take 1 tablet (2 mg total) by mouth at bedtime. For mood control 30 tablet 0  . traZODone (DESYREL) 50 MG tablet Take 0.5 tablets (25 mg total) by mouth at bedtime. For sleep 30 tablet 0    Musculoskeletal: Strength & Muscle Tone: within normal limits Gait & Station: normal Patient leans: N/A  Psychiatric Specialty Exam: Review of Systems  Constitutional: Negative.   HENT: Negative.   Eyes: Negative.   Respiratory: Negative.   Cardiovascular: Negative.   Gastrointestinal: Negative.   Genitourinary: Negative.   Musculoskeletal: Negative.   Skin: Negative.   Neurological: Negative.   Endo/Heme/Allergies: Negative.   Psychiatric/Behavioral: Positive for hallucinations and substance abuse. The patient has insomnia.     Blood pressure 109/65, pulse 64, temperature 97.8 F (36.6 C), temperature source Oral, resp. rate 18, SpO2 99 %.There is no weight on file to calculate BMI.  General Appearance: Disheveled  Eye ConSport and exercise psychologist Fair  Speech:  Normal Rate  Volume:  Normal  Mood:  Anxious and Irritable  Affect:  Congruent  Thought Process:  Coherent  Orientation:  Full (Time, Place, and Person)  Thought Content:  Delusions and Hallucinations: Auditory prior to admission  Suicidal Thoughts:  No, self harm actions prior to admission  Homicidal Thoughts:  No  Memory:  Immediate;   Fair Recent;  Fair Remote;   Fair  Judgement:  Impaired  Insight:  Lacking  Psychomotor Activity:  Normal  Concentration:  Fair  Recall:  [employer] of Knowledge:Fair  Language: Fair  Akathisia:  No  Handed:  Right  AIMS (if indicated):     Assets:  Leisure Time Physical Health Resilience Social Support  ADL's:  Intact  Cognition: Impaired,  Mild  Sleep:      Treatment Plan Summary: Daily contact with patient to assess and evaluate symptoms and  progress in treatment, Medication management and Plan substance induced psychosis with delusions and hallucinations: -Crisis stabilization -Medication management:  Celexa 10 mg daily for depression, Trazodone 50 mg at bedtime for sleep issues, Vistaril 25 mg q6H PRN anxiety, Cogentin 0.5 mg at bedtime to prevent EPS, and Risperdal M-Tabs 2 mg at bedtime for psychosis started -Individual and substance abuse counseling  Disposition: Recommend psychiatric Inpatient admission when medically cleared.  Waylan Boga, [place] 11/25/2015 11:54 AM Patient seen face-to-face for psychiatric evaluation, chart reviewed and case discussed with the physician extender and developed treatment plan. Reviewed the information documented and agree with the treatment plan. Corena Pilgrim, MD

## 2015-11-25 NOTE — ED Notes (Signed)
Patient awake and alert.  Requested bandage for cut on left thumb.  Patient's injured thumb and required stitches.  Patient is pleasant and cooperative.  Requesting breakfast.  States, "I'm just going to rest for awhile."

## 2015-11-25 NOTE — Progress Notes (Signed)
Pt. Is a 39 year old male [facility] by his Father who reports pt. Has been exhibiting self injuring behaviors such as punching himself, stating that he is "sancitoned by the [employer]", and that he works for the [employer], [employer], and the [employer]'.  It was reported that the pt. Punched-out car windows that caused a laceration on his right thumb requiring sutures. Pt. Was also threatening to destroy his Father's property.   Pt. Denies A or VH when writer assessed him, also denies pain or discomfort.  Pt. Training and development officerTells writer "I do not have schrizophrenia, no matter what your paper says, I am just dealing with family drama".  Pt. Refuses his medications stating "I have been told not to take them any more". Pt, has been calm and cooperative since arriving on the unit.

## 2015-11-26 NOTE — Progress Notes (Signed)
[facility] at [facility]. Rosey BathKelly Iola Turri, RN

## 2015-11-26 NOTE — Progress Notes (Signed)
[facility] requesting EKG and IVC paperwork for possible admission.  Seward SpeckLeo Kiamesha Samet [facility] CenterCSW,LCAS Behavioral Health Disposition CSW [phone]

## 2015-11-26 NOTE — Progress Notes (Signed)
Disposition CSW completed patient referrals to the following inpatient psych facilities:  [facility]  CSW will continue to follow patient for placement needs.  Seward SpeckLeo Zarya Lasseigne [facility] CenterCSW,LCAS Behavioral Health Disposition CSW [phone]

## 2015-11-26 NOTE — ED Notes (Signed)
Patient refused EKG twice.  States, "I don't need that.  That's just something else you wanna charge me for.  No thank you, don't need one."  Patient started dropping F bombs after that so nurse departed room.

## 2015-11-26 NOTE — Consult Note (Signed)
[facility] Consult   Reason for Consult:  Violence towards himself, substance abuse Referring Physician:  EDP Patient Identification: Brad Barr MRN:  [id number] Principal Diagnosis: Substance-induced psychotic disorder with delusions [facility]) Diagnosis:   Patient Active Problem List   Diagnosis Date Noted  . Amphetamine abuse [F15.10] 11/25/2015    Priority: High  . Substance-induced psychotic disorder with delusions [facility]) [F19.950] 11/06/2015    Priority: High  . 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 GAD (generalized anxiety disorder) [F41.1] 11/07/2015  . 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 Attention deficit hyperactivity disorder (ADHD) [F90.9] 11/06/2015  . 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 male patient admitted with substance induced mood disorder.  HPI:  On admission:  39 y.o. male who is here under involuntary commitment for evaluation of delusional behavior. He was here earlier with a cut on his right thumb, which was sutured. While he was here, police officers informed us that his father would be putting him under commitment. His father actually came to the emergency department, and drove the patient home. After that, commitment papers were served on the patient, and he was brought back here. Patient's father is the practitioner. He states that the patient is self injury, punching himself, stating that he is "sanctioned by the [facility]", and that he "works for the [employer], [employer], and the [employer]." The patient is reported to have "punched-out car windows." He has also threatened to "destroy property of his father." Patient states that he was admitted at [facility], for psychiatric reasons, 2 weeks ago, and that he was discharged without medication and told to follow-up with a family practice doctor.  He states that he was not given any psychiatric medications. Patient states I don't have psychiatric problems". Patient denies any recent illnesses. He states he hurt his thumb, on a window. He denies intentional self-inflicted injury. There are no other known modifying factors.  Today:  Patient irritable, "I'm not crazy", continues to deny the accusations but continues to abuse drugs and his parents report delusions and destruction of property and self.  Refused medications, denies substance abuse issues.  Past Psychiatric History: Polysubstance abuse  Risk to Self: Is patient at risk for suicide?: No, but patient needs Medical Clearance Risk to Others:   Prior Inpatient Therapy:   Prior Outpatient Therapy:    Past Medical History:  Past Medical History  Diagnosis Date  . Schizophrenia ([place])   . ADHD (attention deficit hyperactivity disorder)    History reviewed. No pertinent past surgical history. Family History: No family history on file. Family Psychiatric  History: Unknown Social History:  History  Alcohol Use  . Yes     History  Drug Use No    Social History   Social History  . Marital Status: Single    Spouse Name: N/A  . Number of Children: N/A  . Years of Education: N/A   Social History Main Topics  . Smoking status: Current Some Day Smoker  . Smokeless tobacco: None  . Alcohol Use: Yes  . Drug Use: No  . Sexual Activity: Not Asked   Other Topics Concern  . None   Social History Narrative   Additional Social History:  Allergies:  No Known Allergies  Labs:  Results for orders placed or performed during the hospital encounter of 11/24/15 (from the past 48 hour(s))  Comprehensive metabolic panel     Status: None   Collection Time: 11/24/15  9:34 PM  Result Value Ref Range   Sodium 139 135 - 145 mmol/L   Potassium 4.0 3.5 - 5.1 mmol/L   Chloride 107 101 - 111 mmol/L   CO2 25 22 - 32 mmol/L   Glucose, Bld 97 65 - 99  mg/dL   BUN 15 6 - 20 mg/dL   Creatinine, Ser 0.68 0.61 - 1.24 mg/dL   Calcium 9.1 8.9 - 10.3 mg/dL   Total Protein 7.3 6.5 - 8.1 g/dL   Albumin 4.4 3.5 - 5.0 g/dL   AST 18 15 - 41 U/L   ALT 26 17 - 63 U/L   Alkaline Phosphatase 62 38 - 126 U/L   Total Bilirubin 0.7 0.3 - 1.2 mg/dL   GFR calc non Af Amer >60 >60 mL/min   GFR calc Af Amer >60 >60 mL/min    Comment: (NOTE) The eGFR has been calculated using the CKD EPI equation. This calculation has not been validated in all clinical situations. eGFR's persistently <60 mL/min signify possible Chronic Kidney Disease.    Anion gap 7 5 - 15  Ethanol (ETOH)     Status: None   Collection Time: 11/24/15  9:34 PM  Result Value Ref Range   Alcohol, Ethyl (B) <5 <5 mg/dL    Comment:        LOWEST DETECTABLE LIMIT FOR SERUM ALCOHOL IS 5 mg/dL FOR MEDICAL PURPOSES ONLY   Salicylate level     Status: None   Collection Time: 11/24/15  9:34 PM  Result Value Ref Range   Salicylate Lvl <2.6 2.8 - 30.0 mg/dL  Acetaminophen level     Status: Abnormal   Collection Time: 11/24/15  9:34 PM  Result Value Ref Range   Acetaminophen (Tylenol), Serum <10 (L) 10 - 30 ug/mL    Comment:        THERAPEUTIC CONCENTRATIONS VARY SIGNIFICANTLY. A RANGE OF 10-30 ug/mL MAY BE AN EFFECTIVE CONCENTRATION FOR MANY PATIENTS. HOWEVER, SOME ARE BEST TREATED AT CONCENTRATIONS OUTSIDE THIS RANGE. ACETAMINOPHEN CONCENTRATIONS >150 ug/mL AT 4 HOURS AFTER INGESTION AND >50 ug/mL AT 12 HOURS AFTER INGESTION ARE OFTEN ASSOCIATED WITH TOXIC REACTIONS.   CBC     Status: None   Collection Time: 11/24/15  9:34 PM  Result Value Ref Range   WBC 8.8 4.0 - 10.5 K/uL   RBC 5.08 4.22 - 5.81 MIL/uL   Hemoglobin 14.5 13.0 - 17.0 g/dL   HCT 43.2 39.0 - 52.0 %   MCV 85.0 78.0 - 100.0 fL   MCH 28.5 26.0 - 34.0 pg   MCHC 33.6 30.0 - 36.0 g/dL   RDW 13.5 11.5 - 15.5 %   Platelets 215 150 - 400 K/uL  Urine rapid drug screen (hosp performed) (Not at [facility])     Status:  Abnormal   Collection Time: 11/24/15  9:57 PM  Result Value Ref Range   Opiates NONE DETECTED NONE DETECTED   Cocaine NONE DETECTED NONE DETECTED   Benzodiazepines NONE DETECTED NONE DETECTED   Amphetamines POSITIVE (A) NONE DETECTED   Tetrahydrocannabinol NONE DETECTED NONE DETECTED   Barbiturates NONE DETECTED NONE DETECTED    Comment:        DRUG SCREEN FOR MEDICAL PURPOSES ONLY.  IF CONFIRMATION IS NEEDED FOR ANY PURPOSE, NOTIFY  LAB WITHIN 5 DAYS.        LOWEST DETECTABLE LIMITS FOR URINE DRUG SCREEN Drug Class       Cutoff (ng/mL) Amphetamine      1000 Barbiturate      200 Benzodiazepine   355 Tricyclics       974 Opiates          300 Cocaine          300 THC              50     Current Facility-Administered Medications  Medication Dose Route Frequency Provider Last Rate Last Dose  . benztropine (COGENTIN) tablet 0.5 mg  0.5 mg Oral QHS Daleen Bo, MD   0.5 mg at 11/24/15 2330  . citalopram (CELEXA) tablet 10 mg  10 mg Oral Daily Daleen Bo, MD   10 mg at 11/24/15 2330  . hydrOXYzine (ATARAX/VISTARIL) tablet 25 mg  25 mg Oral Q6H PRN Daleen Bo, MD      . risperiDONE (RISPERDAL M-TABS) disintegrating tablet 2 mg  2 mg Oral QHS Daleen Bo, MD   2 mg at 11/24/15 2331  . traZODone (DESYREL) tablet 50 mg  50 mg Oral QHS Patrecia Pour, NP   50 mg at 11/25/15 2258   Current Outpatient Prescriptions  Medication Sig Dispense Refill  . benztropine (COGENTIN) 0.5 MG tablet Take 1 tablet (0.5 mg total) by mouth at bedtime. For prevention of EPS 30 tablet 0  . calamine lotion Apply topically 3 (three) times daily. For skin rashes 120 mL 0  . citalopram (CELEXA) 10 MG tablet Take 1 tablet (10 mg total) by mouth daily. For depression 30 tablet 0  . finasteride (PROPECIA) 1 MG tablet Take 0.5 tablets (0.5 mg total) by mouth daily. For hair growth    . hydrOXYzine (ATARAX/VISTARIL) 25 MG tablet Take 1 tablet (25 mg total) by mouth every 6 (six) hours as needed for itching  or anxiety. 45 tablet 0  . predniSONE (DELTASONE) 10 MG tablet Take 3 tablet (57m)  in the am 11/09/15,  Then 2 tablet (284m in am on 11/10/15, then 1 tablet (1045min am 12/3 then stop 6 tablet 0  . risperiDONE (RISPERDAL M-TABS) 2 MG disintegrating tablet Take 1 tablet (2 mg total) by mouth at bedtime. For mood control 30 tablet 0  . traZODone (DESYREL) 50 MG tablet Take 0.5 tablets (25 mg total) by mouth at bedtime. For sleep 30 tablet 0    Musculoskeletal: Strength & Muscle Tone: within normal limits Gait & Station: normal Patient leans: N/A  Psychiatric Specialty Exam: Review of Systems  Constitutional: Negative.   HENT: Negative.   Eyes: Negative.   Respiratory: Negative.   Cardiovascular: Negative.   Gastrointestinal: Negative.   Genitourinary: Negative.   Musculoskeletal: Negative.   Skin: Negative.   Neurological: Negative.   Endo/Heme/Allergies: Negative.   Psychiatric/Behavioral: Positive for hallucinations and substance abuse. The patient has insomnia.     Blood pressure 116/6, pulse 68, temperature 98.2 F (36.8 C), temperature source Oral, resp. rate 18, SpO2 99 %.There is no weight on file to calculate BMI.  General Appearance: Disheveled  Eye ConSport and exercise psychologist Fair  Speech:  Normal Rate  Volume:  Normal, increased at times  Mood:  Anxious and Irritable, angry with MD because "I'm not crazy" and does not need admission, labile  Affect:  Congruent  Thought Process:  Coherent  Orientation:  Full (Time, Place, and Person)  Thought Content:  Delusions and Hallucinations:  Auditory prior to admission  Suicidal Thoughts:  No, self harm actions prior to admission  Homicidal Thoughts:  No  Memory:  Immediate;   Fair Recent;   Fair Remote;   Fair  Judgement:  Impaired  Insight:  Lacking  Psychomotor Activity:  Normal  Concentration:  Fair  Recall:  [employer] of Knowledge:Fair  Language: Fair  Akathisia:  No  Handed:  Right  AIMS (if indicated):     Assets:  Leisure  Time Physical Health Resilience Social Support  ADL's:  Intact  Cognition: Impaired,  Mild  Sleep:      Treatment Plan Summary: Daily contact with patient to assess and evaluate symptoms and progress in treatment, Medication management and Plan substance induced psychosis with delusions and hallucinations: -Crisis stabilization -Medication management:  Celexa 10 mg daily for depression, Trazodone 50 mg at bedtime for sleep issues, Vistaril 25 mg q6H PRN anxiety, Cogentin 0.5 mg at bedtime to prevent EPS, and Risperdal M-Tabs 2 mg at bedtime for psychosis continued and encouraged -Individual and substance abuse counseling  Disposition: Recommend psychiatric Inpatient admission when medically cleared.  Waylan Boga, [place] 11/26/2015 11:14 AM Patient seen face-to-face for psychiatric evaluation, chart reviewed and case discussed with the physician extender and developed treatment plan. Reviewed the information documented and agree with the treatment plan. Corena Pilgrim, MD

## 2015-11-27 MED ORDER — BACITRACIN-NEOMYCIN-POLYMYXIN OINTMENT TUBE
TOPICAL_OINTMENT | CUTANEOUS | Status: DC | PRN
  Filled 2015-11-27: qty 15

## 2015-11-27 NOTE — ED Notes (Signed)
Patient continues to be uncooperative.  Refuses medications and EKG.  He is uninterested in treatment stating, "I'm fine.  I don't need any medication.  I don't know what I'm doing here."  Patient is pleasant until he is asked to comply with his treatment.  At times, he is irate and will curse at staff.

## 2015-11-27 NOTE — Consult Note (Signed)
[facility] Face-to-Face Psychiatry Consult   Reason for Consult:  Violence towards himself, substance abuse Referring Physician:  EDP Patient Identification: Brad Barr MRN:  [id number] Principal Diagnosis: Substance-induced psychotic disorder with delusions [facility]) Diagnosis:   Patient Active Problem List   Diagnosis Date Noted  . Amphetamine abuse [F15.10] 11/25/2015    Priority: High  . Substance-induced psychotic disorder with delusions [facility]) [F19.950] 11/06/2015    Priority: High  . 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 GAD (generalized anxiety disorder) [F41.1] 11/07/2015  . 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 Attention deficit hyperactivity disorder (ADHD) [F90.9] 11/06/2015  . 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 male patient admitted with substance induced mood disorder.  HPI:  On admission:  39 y.o. male who is here under involuntary commitment for evaluation of delusional behavior. He was here earlier with a cut on his right thumb, which was sutured. While he was here, police officers informed us that his father would be putting him under commitment. His father actually came to the emergency department, and drove the patient home. After that, commitment papers were served on the patient, and he was brought back here. Patient's father is the practitioner. He states that the patient is self injury, punching himself, stating that he is "sanctioned by the [facility]", and that he "works for the [employer], [employer], and the [employer]." The patient is reported to have "punched-out car windows." He has also threatened to "destroy property of his father." Patient states that he was admitted at [facility], for psychiatric reasons, 2 weeks ago, and that he was discharged without medication and told to follow-up with a family practice doctor.  He states that he was not given any psychiatric medications. Patient states I don't have psychiatric problems". Patient denies any recent illnesses. He states he hurt his thumb, on a window. He denies intentional self-inflicted injury. There are no other known modifying factors.  Today:  Patient irritable and continues to deny he has any issues, labile mood.  Refusing medications.  Past Psychiatric History: Polysubstance abuse  Risk to Self: Is patient at risk for suicide?: No, but patient needs Medical Clearance Risk to Others:   Prior Inpatient Therapy:   Prior Outpatient Therapy:    Past Medical History:  Past Medical History  Diagnosis Date  . Schizophrenia ([facility])   . ADHD (attention deficit hyperactivity disorder)    History reviewed. No pertinent past surgical history. Family History: No family history on file. Family Psychiatric  History: Unknown Social History:  History  Alcohol Use  . Yes     History  Drug Use No    Social History   Social History  . Marital Status: Single    Spouse Name: N/A  . Number of Children: N/A  . Years of Education: N/A   Social History Main Topics  . Smoking status: Current Some Day Smoker  . Smokeless tobacco: None  . Alcohol Use: Yes  . Drug Use: No  . Sexual Activity: Not Asked   Other Topics Concern  . None   Social History Narrative   Additional Social History:                          Allergies:  No Known Allergies  Labs:  No results found for this or any  previous visit (from the past 48 hour(s)).  Current Facility-Administered Medications  Medication Dose Route Frequency Provider Last Rate Last Dose  . benztropine (COGENTIN) tablet 0.5 mg  0.5 mg Oral QHS Mancel Bale, MD   0.5 mg at 11/24/15 2330  . citalopram (CELEXA) tablet 10 mg  10 mg Oral Daily Mancel Bale, MD   10 mg at 11/24/15 2330  . hydrOXYzine (ATARAX/VISTARIL) tablet 25 mg  25 mg Oral Q6H PRN Mancel Bale, MD      . risperiDONE (RISPERDAL  M-TABS) disintegrating tablet 2 mg  2 mg Oral QHS Mancel Bale, MD   2 mg at 11/24/15 2331  . traZODone (DESYREL) tablet 50 mg  50 mg Oral QHS Charm Rings, NP   50 mg at 11/25/15 2258   Current Outpatient Prescriptions  Medication Sig Dispense Refill  . benztropine (COGENTIN) 0.5 MG tablet Take 1 tablet (0.5 mg total) by mouth at bedtime. For prevention of EPS 30 tablet 0  . calamine lotion Apply topically 3 (three) times daily. For skin rashes 120 mL 0  . citalopram (CELEXA) 10 MG tablet Take 1 tablet (10 mg total) by mouth daily. For depression 30 tablet 0  . finasteride (PROPECIA) 1 MG tablet Take 0.5 tablets (0.5 mg total) by mouth daily. For hair growth    . hydrOXYzine (ATARAX/VISTARIL) 25 MG tablet Take 1 tablet (25 mg total) by mouth every 6 (six) hours as needed for itching or anxiety. 45 tablet 0  . predniSONE (DELTASONE) 10 MG tablet Take 3 tablet ( )  in the am 11/09/15,  Then 2 tablet ( ) in am on 11/10/15, then 1 tablet ( ) in am 12/3 then stop 6 tablet 0  . risperiDONE (RISPERDAL M-TABS) 2 MG disintegrating tablet Take 1 tablet (2 mg total) by mouth at bedtime. For mood control 30 tablet 0  . traZODone (DESYREL) 50 MG tablet Take 0.5 tablets (25 mg total) by mouth at bedtime. For sleep 30 tablet 0    Musculoskeletal: Strength & Muscle Tone: within normal limits Gait & Station: normal Patient leans: N/A  Psychiatric Specialty Exam: Review of Systems  Constitutional: Negative.   HENT: Negative.   Eyes: Negative.   Respiratory: Negative.   Cardiovascular: Negative.   Gastrointestinal: Negative.   Genitourinary: Negative.   Musculoskeletal: Negative.   Skin: Negative.   Neurological: Negative.   Endo/Heme/Allergies: Negative.   Psychiatric/Behavioral: Positive for hallucinations and substance abuse. The patient has insomnia.     Blood pressure 114/63, pulse 68, temperature 98.5 F (36.9 C), temperature source Oral, resp. rate 18, SpO2 99 %.There is no weight  on file to calculate BMI.  General Appearance: Disheveled  Eye Solicitor::  Fair  Speech:  Normal Rate  Volume:  Normal, increased at times  Mood:  Anxious and Irritable, angry with MD because "I'm not crazy" and does not need admission, labile  Affect:  Congruent  Thought Process:  Coherent  Orientation:  Full (Time, Place, and Person)  Thought Content:  Delusions and Hallucinations: Auditory prior to admission  Suicidal Thoughts:  No, self harm actions prior to admission  Homicidal Thoughts:  No  Memory:  Immediate;   Fair Recent;   Fair Remote;   Fair  Judgement:  Impaired  Insight:  Lacking  Psychomotor Activity:  Normal  Concentration:  Fair  Recall:  [employer] of Knowledge:Fair  Language: Fair  Akathisia:  No  Handed:  Right  AIMS (if indicated):     Assets:  Leisure Time Physical Health Resilience  Social Support  ADL's:  Intact  Cognition: Impaired,  Mild  Sleep:      Treatment Plan Summary: Daily contact with patient to assess and evaluate symptoms and progress in treatment, Medication management and Plan substance induced psychosis with delusions and hallucinations: -Crisis stabilization -Medication management:  Celexa 10 mg daily for depression, Trazodone 50 mg at bedtime for sleep issues, Vistaril 25 mg q6H PRN anxiety, Cogentin 0.5 mg at bedtime to prevent EPS, and Risperdal M-Tabs 2 mg at bedtime for psychosis continued and encouraged -Individual and substance abuse counseling  Disposition: Recommend psychiatric Inpatient admission when medically cleared.  Nanine MeansLORD, JAMISON, PMH-NP 11/27/2015 10:23 AM   Case discussed with the physician extender in treatment team, formulated treatment plan. Reviewed the information documented and agree with the treatment plan.  Ulisses Vondrak,JANARDHAHA R. 11/27/2015 4:13 PM

## 2015-11-27 NOTE — Progress Notes (Signed)
CM spoke with pt who confirms uninsured [place] resident with no pcp.  CM discussed and provided written information for uninsured accepting pcps, discussed the importance of pcp vs EDP services for f/u care, www.needymeds.org, www.goodrx.com, discounted pharmacies and other [place] resources such as [facility] , [facility], affordable care act, financial assistance, uninsured dental services, [place] med assist, DSS and  health department  Reviewed resources for [place] uninsured accepting pcps like [facility], [facility] [address], [facility], [facility], local urgent care centers, [facility], [facility], general medical clinics, [facility] [place], [facility] plus others, medication resources, [facility] out patient pharmacies and housing Pt voiced understanding and appreciation of resources provided   Provided [facility] contact information Pt agreed to a referral Cm completed referral Pt to be contact by [facility] clinical liason  

## 2015-11-27 NOTE — ED Notes (Signed)
[facility]

## 2015-11-27 NOTE — ED Notes (Signed)
Patient appears calm, cooperative. Denies SI, HI, AVH. Denies any feelings of anxiety and depression at present. [facility]. Continues to refuse medications.   Encouragement offered. Patient showered.  Q 15 safety checks continue.

## 2015-11-27 NOTE — Plan of Care (Signed)
[facility], she reports patient is accepted to 508[facility]1 under Dr. Elna BreslowEappen.

## 2015-11-27 NOTE — Progress Notes (Signed)
[facility]  Rm. 508-1 by Dr. Jannifer FranklinAkintayo to the service of Dr. Elna BreslowEappen. Denver FasterVictoria Mackayla Mullins RN-BC, BSN

## 2015-11-27 NOTE — ED Notes (Signed)
Patient wanted to know what the plan was for him.  Explained to patient that

## 2015-11-28 ENCOUNTER — Encounter (HOSPITAL_COMMUNITY): Payer: Self-pay

## 2015-11-28 ENCOUNTER — Inpatient Hospital Stay (HOSPITAL_COMMUNITY)
Admission: AD | Admit: 2015-11-28 | Discharge: 2015-11-30 | DRG: 897 | Disposition: A | Discharge status: Home / Self Care | Payer: Federal, State, Local not specified - Other | Source: Intra-hospital | Attending: Psychiatry | Admitting: Psychiatry

## 2015-11-28 DIAGNOSIS — F900 Attention-deficit hyperactivity disorder, predominantly inattentive type: Secondary | ICD-10-CM | POA: Diagnosis not present

## 2015-11-28 DIAGNOSIS — F19950 Other psychoactive substance use, unspecified with psychoactive substance-induced psychotic disorder with delusions: Secondary | ICD-10-CM | POA: Diagnosis not present

## 2015-11-28 DIAGNOSIS — F17200 Nicotine dependence, unspecified, uncomplicated: Secondary | ICD-10-CM | POA: Diagnosis present

## 2015-11-28 DIAGNOSIS — F1510 Other stimulant abuse, uncomplicated: Secondary | ICD-10-CM | POA: Diagnosis present

## 2015-11-28 DIAGNOSIS — F1590 Other stimulant use, unspecified, uncomplicated: Secondary | ICD-10-CM | POA: Diagnosis present

## 2015-11-28 DIAGNOSIS — F411 Generalized anxiety disorder: Secondary | ICD-10-CM | POA: Diagnosis not present

## 2015-11-28 DIAGNOSIS — F1310 Sedative, hypnotic or anxiolytic abuse, uncomplicated: Secondary | ICD-10-CM | POA: Diagnosis present

## 2015-11-28 DIAGNOSIS — F19959 Other psychoactive substance use, unspecified with psychoactive substance-induced psychotic disorder, unspecified: Secondary | ICD-10-CM | POA: Diagnosis present

## 2015-11-28 MED ORDER — ALUM & MAG HYDROXIDE-SIMETH 200-200-20 MG/5ML PO SUSP: 30.0000 mL | ORAL | Status: DC | PRN

## 2015-11-28 MED ORDER — TRAZODONE 25 MG HALF TABLET: 25.0000 mg | ORAL_TABLET | Freq: Every day | ORAL | Status: DC

## 2015-11-28 MED ORDER — GUAIFENESIN ER 600 MG PO TB12
600.0000 mg | ORAL_TABLET | Freq: Two times a day (BID) | ORAL | Status: DC | PRN
  Administered 2015-11-28 – 2015-11-30 (×4): 600 mg via ORAL
  Filled 2015-11-28 (×4): qty 1

## 2015-11-28 MED ORDER — MAGNESIUM HYDROXIDE 400 MG/5ML PO SUSP: 30.0000 mL | Freq: Every day | ORAL | Status: DC | PRN

## 2015-11-28 MED ORDER — LORAZEPAM 2 MG/ML IJ SOLN: 1.0000 mg | Freq: Four times a day (QID) | INTRAMUSCULAR | Status: DC | PRN

## 2015-11-28 MED ORDER — LORAZEPAM 1 MG PO TABS
1.0000 mg | ORAL_TABLET | Freq: Four times a day (QID) | ORAL | Status: DC | PRN
  Administered 2015-11-28 – 2015-11-29 (×2): 1 mg via ORAL
  Filled 2015-11-28 (×2): qty 1

## 2015-11-28 MED ORDER — BENZTROPINE MESYLATE 0.5 MG PO TABS
0.5000 mg | ORAL_TABLET | Freq: Every day | ORAL | Status: DC
Start: 2015-11-28 — End: 2015-11-30
  Filled 2015-11-28: qty 7
  Filled 2015-11-28 (×2): qty 1
  Filled 2015-11-28: qty 7
  Filled 2015-11-28: qty 1

## 2015-11-28 MED ORDER — ACETAMINOPHEN 325 MG PO TABS: 650.0000 mg | ORAL_TABLET | Freq: Four times a day (QID) | ORAL | Status: DC | PRN

## 2015-11-28 MED ORDER — CITALOPRAM HYDROBROMIDE 10 MG PO TABS
10.0000 mg | ORAL_TABLET | Freq: Every day | ORAL | Status: DC
  Administered 2015-11-28 – 2015-11-30 (×3): 10 mg via ORAL
  Filled 2015-11-28 (×2): qty 1
  Filled 2015-11-28: qty 7
  Filled 2015-11-28: qty 1
  Filled 2015-11-28: qty 7
  Filled 2015-11-28: qty 1

## 2015-11-28 MED ORDER — TRAZODONE HCL 50 MG PO TABS
50.0000 mg | ORAL_TABLET | Freq: Every evening | ORAL | Status: DC | PRN
  Filled 2015-11-28 (×6): qty 1

## 2015-11-28 MED ORDER — HYDROXYZINE HCL 25 MG PO TABS
25.0000 mg | ORAL_TABLET | Freq: Four times a day (QID) | ORAL | Status: DC | PRN
  Filled 2015-11-28: qty 10

## 2015-11-28 MED ORDER — HALOPERIDOL 5 MG PO TABS
5.0000 mg | ORAL_TABLET | Freq: Every day | ORAL | Status: DC
  Filled 2015-11-28: qty 1
  Filled 2015-11-28 (×2): qty 7
  Filled 2015-11-28 (×2): qty 1

## 2015-11-28 MED ORDER — RISPERIDONE 2 MG PO TBDP
2.0000 mg | ORAL_TABLET | Freq: Every day | ORAL | Status: DC
  Filled 2015-11-28 (×2): qty 1

## 2015-11-28 NOTE — Tx Team (Signed)
Interdisciplinary Treatment Plan Update (Adult)  Date:  11/28/2015   Time Reviewed:  8:38 AM   Progress in Treatment: Attending groups: Yes. Participating in groups:  Yes. Taking medication as prescribed:  Yes. Tolerating medication:  Yes. Family/Significant other contact made:  Yes Patient understands diagnosis:  No  In denial Discussing patient identified problems/goals with staff:  Yes, see initial care plan. Medical problems stabilized or resolved:  Yes. Denies suicidal/homicidal ideation: Yes. Issues/concerns per patient self-inventory:  No. Other:  New problem(s) identified:  Father states that he believes pt is using meth based on both a pipe father found in the house, and pt's erratic, aggressive behavior, including destroying his apartment and property therein.  Pt denies, blaming it on sister.  Father insists pt needs rehab.  Pt denies.  Father states he will quit paying rent for pt to live in his current apartment.  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  Discharge Plan or Barriers: see below  Reason for Continuation of Hospitalization: Aggression Medication stabilization  Comments:  Brad Barr is a 39 y.o. male who is here under involuntary commitment for evaluation of delusional behavior. He was here earlier with a cut on his right thumb, which was sutured. While he was here, police officers informed us that his father would be putting him under commitment. His father actually came to the emergency department, and drove the patient home. After that, commitment papers were served on the patient, and he was brought back here. Patient's father is the practitioner. He states that the patient is self injury, punching himself, stating that he is "sanctioned by the [facility]", and that he "works for the [employer], and the [employer]." The patient is reported to have "punched-out car windows." He has also  threatened to "destroy property of his father." Patient states that he was admitted at [facility], for psychiatric reasons, 2 weeks ago, and that he was discharged without medication and told to follow-up with a family practice doctor. He states that he was not given any psychiatric medications. Patient states I don't have psychiatric problems". Patient denies any recent illnesses. He states he hurt his thumb, on a window. He denies intentional self-inflicted injury.  Will continue Celexa 10 mg po daily for affective sx. Will continue Haldol 5 mg po qhs for mood lability/psychosis.Patient has been refusing it - states he was never delusional and has no psychosis. Will continue Cogentin 0.5 mg po qhs for EPS. Will continue Trazodone 50 mg po qhs for sleep.  Estimated length of stay:  1-2 days  New goal(s):  Review of initial/current patient goals per problem list:   Review of initial/current patient goals per problem list:  1. Goal(s): Patient will participate in aftercare plan   Met: Yes   Target date: 3-5 days post admission date   As evidenced by: Patient will participate within aftercare plan AEB aftercare provider and housing plan at discharge being identified. 11/28/15:  Return home, follow up outpt        5. Goal(s): Patient will demonstrate decreased signs of psychosis  * Met:   * Target date: 3-5 days post admission date  * As evidenced by: Patient will demonstrate decreased frequency of AVH or return to baseline function         Attendees: Patient:  11/28/2015 8:38 AM   Family:   11/28/2015 8:38 AM   Physician:  Ursula Alert, MD 11/28/2015 8:38 AM   Nursing:   Gaylan Gerold, RN 11/28/2015 8:38 AM   CSW:    Roque Lias, LCSW   11/28/2015 8:38 AM   Other:  11/28/2015 8:38 AM   Other:   11/28/2015 8:38 AM   Other:  Lars Pinks, Nurse CM 11/28/2015 8:38 AM   Other:   11/28/2015 8:38 AM   Other:  Norberto Sorenson, [place]  11/28/2015 8:38 AM    Other:  11/28/2015 8:38 AM   Other:  11/28/2015 8:38 AM   Other:  11/28/2015 8:38 AM   Other:  11/28/2015 8:38 AM   Other:  11/28/2015 8:38 AM   Other:   11/28/2015 8:38 AM    Scribe for Treatment Team:   Trish Mage, 11/28/2015 8:38 AM

## 2015-11-28 NOTE — Tx Team (Signed)
Initial Interdisciplinary Treatment Plan   PATIENT STRESSORS: Marital or family conflict Medication change or noncompliance   PATIENT STRENGTHS: Capable of independent living General fund of knowledge   PROBLEM LIST: Problem List/Patient Goals Date to be addressed Date deferred Reason deferred Estimated date of resolution  "nothing i do not need to be here"                                                       DISCHARGE CRITERIA:  Ability to meet basic life and health needs Improved stabilization in mood, thinking, and/or behavior Verbal commitment to aftercare and medication compliance  PRELIMINARY DISCHARGE PLAN: Attend aftercare/continuing care group Return to previous living arrangement  PATIENT/FAMIILY INVOLVEMENT: This treatment plan has been presented to and reviewed with the patient, Brad Barr, and/or family member, .  The patient and family have been given the opportunity to ask questions and make suggestions.  Brad Barr, Brad Barr 11/28/2015, 1:39 AM

## 2015-11-28 NOTE — Progress Notes (Signed)
DAR NOTE: Patient presents with anxious mood and affect.  Denies pain, auditory and visual hallucinations.  Rates depression at 0, hopelessness at 0, and anxiety at 0.  Maintained on routine safety checks.  Medications given as prescribed.  Support and encouragement offered as needed.  Attended group and participated.  States goal for today is "have a good day."  Patient observed socializing with peers in the dayroom.  Offered no complaint.  Stitches to right thumb is intact.  No s/s of infection noted.  Patient was unwilling to take morning medication stating "I don't need it because I don't have depression."  Patient educated on the importance of medication compliance.

## 2015-11-28 NOTE — H&P (Addendum)
Psychiatric Admission Assessment Adult  Patient Identification: CUTBERTO WINFREE MRN:  [id number] Date of Evaluation:  11/28/2015 Chief Complaint: Patient states " I am not sure. 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 GAD (generalized anxiety disorder) [F41.1] 11/07/2015  . 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 Pt thereafter was petitioned to be IVCed by father - Mr.[name] Esquivel.   Per IVC petition " Pt was delusional and threatened to hurt father.'  Per initial notes in EHR; " 39 y.o. male who is here under involuntary commitment for evaluation of delusional behavior. He was here earlier with a cut on his right thumb, which was sutured. While he was here, police officers informed us that his father would be putting him under commitment. His father actually came to the emergency department, and drove the patient home. After that, commitment papers were served on the patient, and he was brought back here. Patient's father is the practitioner. He states that the patient is self injury, punching himself, stating that he is "sanctioned by the [facility]", and that he "works for the [employer], [employer], and the [employer]." The patient is reported to have "punched-out car windows." He has also threatened to "destroy property of his father." Patient states that he was admitted at [facility], for psychiatric reasons, 2 weeks ago, and that  he was discharged without medication and told to follow-up with a family practice doctor. He states that he was not given any psychiatric medications. Patient states I don't have psychiatric problems". Patient denies any recent illnesses. He states he hurt his thumb, on a window. He denies intentional self-inflicted injury."   Patient seen today and chart reviewed.Discussed patient with treatment team. Pt today seen as calm , even though he appears to be anxious and his speech pressured - mostly because he is trying to explain this admission. He seems to be very angry and irritable towards his family - states that "they keep doing this, I am not crazy." Pt denies any concerns. Pt reports that he stopped taking his risperdal , since he was not psychotic , but continued to take his celexa. Pt reports that he is willing to stay on celexa.   Pt tries to minimize his substance abuse - states he does not abuse any drugs - reports he has ADHD- and he still has prescriptions from his previous provider. Pt also currently denies abusing any drugs including BZD -(he does have a hx of abusing BZD) .  Writer attempted to get collateral information from father - who IVCed pt - pt wanted to be present during the call - but father was not reachable and Clinical research associate left voice mail.        Associated Signs/Symptoms: Depression Symptoms:  psychomotor agitation, anxiety, disturbed sleep, (Hypo) Manic Symptoms:  Delusions, Distractibility, Impulsivity, Irritable Mood, Anxiety Symptoms:  denies Psychotic Symptoms:  Paranoia, PTSD Symptoms: NA Total Time spent with patient: 45 minutes  Past Psychiatric History:  Pt reports a hx of ADHD, as well as was admitted at [facility] on [facility], [facility] - recently. Pt denies hx of suicide attempts.  Risk to Self: Is patient at risk for suicide?: No Risk to Others:   Prior Inpatient Therapy:   Prior Outpatient Therapy:    Alcohol Screening: 1. How often do you have a drink  containing alcohol?: Monthly or less 2. How many drinks containing alcohol do you have on a typical day when you are drinking?: 1 or 2 3. How often do you have six or more drinks on one occasion?: Never Preliminary Score: 0 9. Have you or someone else been injured as a result of your drinking?: No 10. Has a relative or friend or a doctor or another health worker been concerned about your drinking or suggested you cut down?: No Alcohol Use Disorder Identification Test Final Score (AUDIT): 1 Brief Intervention: AUDIT score less than 7 or less-screening does not suggest unhealthy drinking-brief intervention not indicated Substance Abuse History in the last 12 months:  Yes.   does have hx of stimulant abuse, BZD abuse- pt denies it Consequences of Substance Abuse: Medical Consequences:  admissions Family Consequences:  relational struggles Previous Psychotropic Medications: Risperidone, Celexa, Adderall  Psychological Evaluations: None Past Medical History:  Past Medical History  Diagnosis Date  . Schizophrenia (HCC)   . ADHD (attention deficit hyperactivity disorder)    History reviewed. No pertinent past surgical history. Family History:  Family History  Problem Relation Age of Onset  . Mental illness Other    Family Psychiatric  History: Pt reports " My whole family has mental illness.'Not able to elaborate further. Social History: Pt is single , self employed, works for [employer] . Pt reports that he has a pending DUI.  History  Alcohol Use  . Yes     History  Drug Use  . Yes  . Special: Amphetamines    Social History   Social History  . Marital Status: Single    Spouse Name: N/A  . Number of Children: N/A  . Years of Education: N/A   Social History Main Topics  . Smoking status: Current Some Day Smoker  . Smokeless tobacco: None  . Alcohol Use: Yes  . Drug Use: Yes    Special: Amphetamines  . Sexual Activity: Not Asked   Other Topics Concern  . None   Social  History Narrative   Additional Social History:    Pain Medications: see mar Prescriptions: see mar Over the Counter: see mar History of alcohol / drug use?: Yes Negative Consequences of Use: Personal relationships                    Allergies:  No Known Allergies Lab Results: No results found for this or any previous visit (from the past 48 hour(s)).  Metabolic Disorder Labs:  Lab Results  Component Value Date   HGBA1C 5.5 11/07/2015   MPG 111 11/07/2015   Lab Results  Component Value Date   PROLACTIN 63.1* 11/07/2015   Lab Results  Component Value Date   CHOL 208* 11/07/2015   TRIG 181* 11/07/2015   HDL 56 11/07/2015   CHOLHDL 3.7 11/07/2015   VLDL 36 11/07/2015   LDLCALC 116* 11/07/2015    Current Medications: Current Facility-Administered Medications  Medication Dose Route Frequency Provider Last Rate Last Dose  . acetaminophen (TYLENOL) tablet 650 mg  650 mg Oral Q6H PRN Kerry Hough, PA-C      . alum &  mag hydroxide-simeth (MAALOX/MYLANTA) 200-200-20 MG/5ML suspension 30 mL  30 mL Oral Q4H PRN Kerry Hough, PA-C      . benztropine (COGENTIN) tablet 0.5 mg  0.5 mg Oral QHS [name] E [name], PA-C      . citalopram (CELEXA) tablet 10 mg  10 mg Oral Daily Kerry Hough, PA-C   10 mg at 11/28/15 [id number]  . hydrOXYzine (ATARAX/VISTARIL) tablet 25 mg  25 mg Oral Q6H PRN Kerry Hough, PA-C      . LORazepam (ATIVAN) tablet 1 mg  1 mg Oral Q6H PRN Jomarie Longs, MD       Or  . LORazepam (ATIVAN) injection 1 mg  1 mg Intramuscular Q6H PRN Jomarie Longs, MD      . magnesium hydroxide (MILK OF MAGNESIA) suspension 30 mL  30 mL Oral Daily PRN Kerry Hough, PA-C      . risperiDONE (RISPERDAL M-TABS) disintegrating tablet 2 mg  2 mg Oral QHS [name] E [name], PA-C      . traZODone (DESYREL) tablet 50 mg  50 mg Oral QHS,MR X 1 [name] E [name], PA-C       PTA Medications: Prescriptions prior to admission  Medication Sig Dispense Refill Last Dose  .  benztropine (COGENTIN) 0.5 MG tablet Take 1 tablet (0.5 mg total) by mouth at bedtime. For prevention of EPS 30 tablet 0   . calamine lotion Apply topically 3 (three) times daily. For skin rashes 120 mL 0   . citalopram (CELEXA) 10 MG tablet Take 1 tablet (10 mg total) by mouth daily. For depression 30 tablet 0   . finasteride (PROPECIA) 1 MG tablet Take 0.5 tablets (0.5 mg total) by mouth daily. For hair growth     . hydrOXYzine (ATARAX/VISTARIL) 25 MG tablet Take 1 tablet (25 mg total) by mouth every 6 (six) hours as needed for itching or anxiety. 45 tablet 0   . predniSONE (DELTASONE) 10 MG tablet Take 3 tablet ( )  in the am 11/09/15,  Then 2 tablet ( ) in am on 11/10/15, then 1 tablet ( ) in am 12/3 then stop 6 tablet 0   . risperiDONE (RISPERDAL M-TABS) 2 MG disintegrating tablet Take 1 tablet (2 mg total) by mouth at bedtime. For mood control 30 tablet 0   . traZODone (DESYREL) 50 MG tablet Take 0.5 tablets (25 mg total) by mouth at bedtime. For sleep 30 tablet 0     Musculoskeletal: Strength & Muscle Tone: within normal limits Gait & Station: normal Patient leans: N/A  Psychiatric Specialty Exam: Physical Exam  Nursing note reviewed. Constitutional:  I concur with PE done in ED.    Review of Systems  Psychiatric/Behavioral: Positive for substance abuse (UDS + for amphetamines). Negative for suicidal ideas and hallucinations. The patient is nervous/anxious.   All other systems reviewed and are negative.   Blood pressure 119/78, pulse 69, temperature 98.4 F (36.9 C), temperature source Oral, resp. rate 16, height  (1.88 m), weight 95.255 kg (210 lb).Body mass index is 26.95 kg/(m^2).  General Appearance: Fairly Groomed  Patent attorney::  Good  Speech:  Clear and Coherent and Pressured  Volume:  Normal  Mood:  Angry, Anxious and Irritable  Affect:  Labile  Thought Process:  Goal Directed  Orientation:  Full (Time, Place, and Person)  Thought Content:  Rumination and  does appear to be paranoid towards family- but does not endorse any delusions  Suicidal Thoughts:  No  Homicidal Thoughts:  No but per IVC  petition pt was threatening to harm father  Memory:  Immediate;   Fair Recent;   Fair Remote;   Fair  Judgement:  Impaired  Insight:  Lacking  Psychomotor Activity:  Restlessness  Concentration:  Fair  Recall:  FiservFair  Fund of Knowledge:Fair  Language: Fair  Akathisia:  No  Handed:    AIMS (if indicated):     Assets:  Desire for Improvement Resilience Social Support  ADL's:  Intact  Cognition: WNL  Sleep:  Number of Hours: 3.25    Treatment Plan Summary:Patient presented IVCed for aggressive, delusional behavior, threatening to hurt his father. Pt will need to be treated on an inpatient basis. Daily contact with patient to assess and evaluate symptoms and progress in treatment and Medication management  Patient will benefit from inpatient treatment and stabilization.  Estimated length of stay is 5-7 days.  Reviewed past medical records,treatment plan.  Will start Celexa 10 mg po daily for affective sx. Will DC risperidone , start Haldol 5 mg po qhs for mood lability/psychosis. Will add Cogentin 0.5 mg po qhs for EPS. Will add Trazodone 50 mg po qhs for sleep. Will make available PRN medications as per agitation protocol. Will continue to monitor vitals ,medication compliance and treatment side effects while patient is here.  Will monitor for medical issues as well as call consult as needed.  Reviewed labs- HBA1C, LIPID PANEL, TSH ( 11/07/15) - WNL , PL -(11/07/15) - slightly elevated ,UDS- pos for amphetamines, BAL<5.  CSW will start working on disposition.  Patient to participate in therapeutic milieu .        Observation Level/Precautions:  15 minute checks     Psychotherapy:  Group therapy, individual therapy, psychoeducation  Medications:  See MAR above  Consultations: None    Discharge Concerns: None    Estimated LOS: 5-7  days  Other:  N/A    I certify that inpatient services furnished can reasonably be expected to improve the patient's condition.    Evonne Rinks, MD 11/28/2015  1;35 pm  Addendum : 3;51 pm -11/28/15  Patient's father returned writer's call - per him patient was delusional and agitated and ripped apart his apartment and also threatened to kill his family. Pt likely abusing methamphetamines since he found meth pipes in his apartment. Pt also was using BZD - unknown how much - likely buying it off the streets.Per father , he is going to take a restraining order against pt. Father also states that he has been paying his rent and will now stop paying it and that he is going to be evicted from apartment.  Jomarie LongsSaramma Beonka Amesquita ,MD Attending Psychiatrist  [facility]

## 2015-11-28 NOTE — Progress Notes (Signed)
Adult Psychoeducational Group Note  Date:  11/28/2015 Time:  8:44 PM  Group Topic/Focus:  Wrap-Up Group:   The focus of this group is to help patients review their daily goal of treatment and discuss progress on daily workbooks.  Participation Level:  Active  Participation Quality:  Appropriate  Affect:  Appropriate  Cognitive:  Appropriate  Insight: Appropriate  Engagement in Group:  Engaged  Modes of Intervention:  Discussion  Additional Comments: The patient attended group.The patient also said he enjoyed group.  Octavio Mannshigpen, Axel Frisk Lee 11/28/2015, 8:44 PM

## 2015-11-28 NOTE — Progress Notes (Signed)
Pt is a 39 year old male admitted with substance induced psychotic disorder   Pt is under IVC by father who said pt thinks he works for the [employer] and [employer]   Pt did not mention any delusional beliefs during the admission assessment but does appear to respond to internal stimuli such as smiling inappropriately    He is cooperative during the assessment   He said he took his medications but did not get the prescriptions filled    He said he does not want medications and he should not be here that his father and him dont see eye to eye so his fathers solution is to take out papers on him   He said he cut his thumb working on a window and denies suicidal ideation  Pt denies psychiatric problems    Pt was given nourishment and oriented to the unit    Pt assessment completed and orders received    Pt denied any need for medications to help him get back to sleep    Q 15 min checks initiated and explained   Pt is presently safe

## 2015-11-28 NOTE — [facility] Group Notes (Signed)
[facility] Group Notes:  (Nursing/MHT/Case Management/Adjunct)  Date:  11/28/2015   Time:  0930  Type of Therapy:  Nurse Education  Participation Level:  Active  Participation Quality:  Appropriate and Attentive  Affect:  Appropriate  Cognitive:  Alert and Appropriate  Insight:  Appropriate and Good  Engagement in Group:  Engaged  Modes of Intervention:  Activity, Discussion, Education and Exploration  Summary of Progress/Problems: Topic was on Recovery.  Discussed the importance of recovery and that recovery is a process that is individualized.  Group encouraged to continue with the process even when they fall short and hit a road block.  Patient was attentive and receptive. Mickie Baillizabeth O Iwenekha 10/26/2015, 1:22 PM

## 2015-11-28 NOTE — [facility] Group Notes (Signed)
[facility] LCSW Group Therapy  11/28/2015 , 2:40 PM   Type of Therapy:  Group Therapy  Participation Level:  Active  Participation Quality:  Attentive  Affect:  Appropriate  Cognitive:  Alert  Insight:  Improving  Engagement in Therapy:  Engaged  Modes of Intervention:  Discussion, Exploration and Socialization  Summary of Progress/Problems: Today's group focused on the term Diagnosis.  Participants were asked to define the term, and then pronounce whether it is a negative, positive or neutral term.  Invited.  Chose to not attend.  [name], Zykira Matlack B 11/28/2015 , 2:40 PM

## 2015-11-28 NOTE — BHH Counselor (Signed)
Adult Comprehensive Assessment  Patient ID: Brad Barr, male DOB: 01/14/1976, 39 y.o. MRN: [id number]  Information Source: Information source: Patient  Current Stressors:  Employment / Job issues: self employed Family Relationships: While he identifies them as supports, he also identifies his family as Hospital doctordysfunctional Financial / Lack of resources (include bankruptcy): Dependent upon parents for financial help Substance abuse: denies issues despite recent DUI  Living/Environment/Situation:  Living Arrangements: Alone Living conditions (as described by patient or guardian): good How long has patient lived in current situation?: 6 years What is atmosphere in current home: Comfortable  Family History:  Are you sexually active?: Yes Does patient have children?: No  Childhood History:  By whom was/is the patient raised?: Both parents Patient's description of current relationship with people who raised him/her: good-but they are having marital issues and dealing with pt's sister and son and it's all very complicated Does patient have siblings?: Yes Number of Siblings: 2 Description of patient's current relationship with siblings: 1 half brother in [place]-limited contact Good with sister who is currently staying with parents Did patient suffer any verbal/emotional/physical/sexual abuse as a child?: No Did patient suffer from severe childhood neglect?: No Has patient ever been sexually abused/assaulted/raped as an adolescent or adult?: No Was the patient ever a victim of a crime or a disaster?: No Witnessed domestic violence?: No Has patient been effected by domestic violence as an adult?: No  Education:  Highest grade of school patient has completed: GED through [employer] Currently a Consulting civil engineerstudent?: No Learning disability?: No  Employment/Work Situation:  Employment situation: Employed Where is patient currently employed?: 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 [employer] How  long has patient been employed?: 2007 Patient's job has been impacted by current illness: No What is the longest time patient has a held a job?: 8 years 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 Has patient ever been in the Eli Lilly and Companymilitary?: No Has patient ever served in combat?: No Are There Guns or Other Weapons in Your Home?: No  Financial Resources:  Financial resources: Income from employment, Support from parents / caregiver, Food stamps Does patient have a representative payee or guardian?: No  Alcohol/Substance Abuse:  What has been your use of drugs/alcohol within the last 12 months?: Drink wine-glass a week-denies weed-denies overuse of amphetamines, benzos Alcohol/Substance Abuse Treatment Hx: Denies past history Has alcohol/substance abuse ever caused legal problems?: Yes (DUI in 89 Recent one in which he was DUI but did not have alcohol in system-he is fighting it)  Social Support System:  Patient's Community Support System: Production assistant, radioGood Describe Community Support System: parents, sister Type of faith/religion: Christian-believe in [place] How does patient's faith help to cope with current illness?: "My faith is everything to me"  Leisure/Recreation:  Leisure and Hobbies: working-taking care of things-setting goals and meeting them  Strengths/Needs:  What things does the patient do well?: good at everything he tries-quick Advice workerlearner In what areas does patient struggle / problems for patient: "My family-trying to help them get straightened out"  Discharge Plan:  Does patient have access to transportation?: Yes Will patient be returning to same living situation after discharge?: Yes Currently receiving community mental health services: No If no, would patient like referral for services when discharged?: Yes (What county?) [place] [place] Does patient have financial barriers related to discharge medications?: Yes Patient  description of barriers related to discharge medications: limited income, no insurance  Summary/Recommendations:  Summary and Recommendations (to be completed by  the evaluator): Brad Barr is a 39 YO Caucasian male who is here due to symptoms of psychosis related to stimulant abuse, the second time in a month, which he denies, as well as denying any other alcohol or substance abuse despite a recent DUI. He presents as angry and hostile towards his family - states that "they keep doing this,[meaning IVC him]  I am not crazy." Pt denies any concerns. Pt reports that he stopped taking his risperdal , since he was not psychotic , but continued to take his celexa. Pt reports that he is willing to stay on celexa.   Brad Barr denies any substance abuse, but confirms he has ADHD and insists he has prescriptions from a previous provider.  According to his father, he has been using methamphetamine, aeb finding a pipe and other paraphernalia in his apartment, and that father plans to cut him off of financial support starting immediately, and will not allow him to stay with the family. Brad Barr can benefit from crises stabilization, medication management, therapeutic milieu and referral for services. However, even after evidence presented about phone call with father, he still denies use and rejects any referral to a substance abuse program.  Daryel Gerald B. 11/07/2015

## 2015-11-28 NOTE — [facility] Suicide Risk Assessment (Signed)
[facility]   Nursing information obtained from:    Demographic factors:    Current Mental Status:    Loss Factors:    Historical Factors:    Risk Reduction Factors:    Total Time spent with patient: 30 minutes Principal Problem: Substance-induced psychotic disorder with delusions (HCC) Diagnosis:   Patient Active Problem List   Diagnosis Date Noted  . 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 GAD (generalized anxiety disorder) [F41.1] 11/07/2015  . 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  World Science writerHealth Organization [facility]WHO). Score between 0-7:  no or low risk or alcohol related problems. Score between 8-15:  moderate risk of alcohol related problems. Score between 16-19:  high risk of alcohol related problems. Score 20 or above:  warrants further diagnostic evaluation for alcohol dependence and treatment.   CLINICAL FACTORS:   Alcohol/Substance Abuse/Dependencies Previous Psychiatric Diagnoses and Treatments   Musculoskeletal: Strength & Muscle Tone: within normal limits Gait & Station: normal Patient leans: N/A  Psychiatric Specialty Exam: Physical Exam  Review of Systems  All other systems reviewed and are negative.   Blood pressure 119/78, pulse 69, temperature 98.4 F (36.9 C), temperature source Oral, resp. rate 16, height 6\' 2"  (1.88 m), weight 95.255 kg (210 lb).Body mass index is 26.95 kg/(m^2).                        Please see H&P.                                  COGNITIVE FEATURES THAT CONTRIBUTE TO RISK:   Closed-mindedness, Polarized thinking and Thought constriction (tunnel vision)    SUICIDE RISK:   Mild:  Suicidal ideation of limited frequency, intensity, duration, and specificity.  There are no identifiable plans, no associated intent, mild dysphoria and related symptoms, good self-control (both objective and subjective assessment), few other risk factors, and identifiable protective factors, including available and accessible social support.  PLAN OF CARE: Please see H&P.   Medical Decision Making:  Review of Psycho-Social Stressors (1), Review or order clinical lab tests (1), Decision to obtain old records (1), Review and summation of old records (2), Established Problem, Worsening (2), Review of Last Therapy Session (1), Review or order medicine tests (1) and Review of New Medication or Change in Dosage (2)  I certify that inpatient services furnished can reasonably be expected to improve the patient's condition.   Issachar Broady MD 11/28/2015, 1:13 PM

## 2015-11-28 NOTE — Tx Team (Signed)
Interdisciplinary Treatment Plan Update (Adult)  Date:  11/28/2015   Time Reviewed:  5:34 PM   Progress in Treatment: Attending groups: Yes. Participating in groups:  Yes. Taking medication as prescribed:  Yes. Tolerating medication:  Yes. Family/Significant other contact made:  Yes Patient understands diagnosis:  No In denieal about the extent of his drug use Discussing patient identified problems/goals with staff:  Yes, see initial care plan. Medical problems stabilized or resolved:  Yes. Denies suicidal/homicidal ideation: Yes. Issues/concerns per patient self-inventory:  No. Other:  New problem(s) identified:  Discharge Plan or Barriers: see below  Reason for Continuation of Hospitalization: Medication stabilization Other; describe further observation  Comments:  Estimated length of stay: 1-2 days  New goal(s):  Review of initial/current patient goals per problem list:   Review of initial/current patient goals per problem list:  1. Goal(s): Patient will participate in aftercare plan   Met: Yes   Target date: 3-5 days post admission date   As evidenced by: Patient will participate within aftercare plan AEB aftercare provider and housing plan at discharge being identified.  11/28/2015: States he will return home and follow up [facility]     4. Goal(s): Patient will demonstrate decreased signs of withdrawal due to substance abuse   Met: Yes   Target date: 3-5 days post admission date   As evidenced by: Patient will produce a CIWA/COWS score of 0, have stable vitals signs, and no symptoms of withdrawal 11/28/15:  No signs nor symptoms of withdrawal today.    5. Goal(s): Patient will demonstrate decreased signs of psychosis  * Met: Yes  * Target date: 3-5 days post admission date  * As evidenced by: Patient will demonstrate decreased frequency of AVH or return to baseline function 11/28/15:  No signs nor symptoms of psychosis today, despite evidence  to the contrary prior to admission          Attendees: Patient:  11/28/2015 5:34 PM   Family:   11/28/2015 5:34 PM   Physician:  Ursula Alert, MD 11/28/2015 5:34 PM   Nursing:   Gaylan Gerold, RN 11/28/2015 5:34 PM   CSW:    Roque Lias, [place]   11/28/2015 5:34 PM   Other:  11/28/2015 5:34 PM   Other:   11/28/2015 5:34 PM   Other:  Lars Pinks, Nurse CM 11/28/2015 5:34 PM   Other:   11/28/2015 5:34 PM   Other:  Norberto Sorenson, [place]  11/28/2015 5:34 PM   Other:  11/28/2015 5:34 PM   Other:  11/28/2015 5:34 PM   Other:  11/28/2015 5:34 PM   Other:  11/28/2015 5:34 PM   Other:  11/28/2015 5:34 PM   Other:   11/28/2015 5:34 PM    Scribe for Treatment Team:   Trish Mage, 11/28/2015 5:34 PM

## 2015-11-29 MED ORDER — BACITRACIN-NEOMYCIN-POLYMYXIN OINTMENT TUBE
TOPICAL_OINTMENT | Freq: Two times a day (BID) | CUTANEOUS | Status: DC
  Administered 2015-11-29 – 2015-11-30 (×2): via TOPICAL
  Filled 2015-11-29: qty 15

## 2015-11-29 NOTE — Progress Notes (Signed)
DAR NOTE: Patient is calm and pleasant.  Denies pain, auditory and visual hallucinations.  Rates depression at 0, hopelessness at 0, and anxiety at 0.  Maintained on routine safety checks.  Medications given as prescribed.  Support and encouragement offered as needed.  Attended group and participated.  Stitches to left thumb is intact.  No s/s of infection is noted.  Patient encouraged to keep hands and area cleaned.   States goal for today is "have a good day."  Patient observed socializing with peers in the dayroom.  Offered no complaint.

## 2015-11-29 NOTE — Progress Notes (Signed)
  [facility] Adult Case Management Discharge Plan :  Will you be returning to the same living situation after discharge:  Yes,  home At discharge, do you have transportation home?: Yes,  family Do you have the ability to pay for your medications: Yes,  mental health  Release of information consent forms completed and in the chart;  Patient's signature needed at discharge.  Patient to Follow up at: Follow-up Information    Follow up with [facility] On 12/12/2015.   Why:  Go for an assessment for services [substance abuse groups] on Tuesday, January 3 at [facility].   Contact information:   [address] [place]  [phone]      Next level of care provider has access to [facility] Link:no  Safety Planning and Suicide Prevention discussed: Yes,  yes  Have you used any form of tobacco in the last 30 days? (Cigarettes, Smokeless Tobacco, Cigars, and/or Pipes): No  Has patient been referred to the Quitline?: N/A patient is not a smoker  Patient has been referred for addiction treatment: Yes  Ida Rogueorth, Elanie Hammitt B 11/29/2015, 4:31 PM

## 2015-11-29 NOTE — Progress Notes (Signed)
D   Pt is pleasant on approach and cooperative  He has not made any delusional statements and has been compliant with the exception of taking certain medications which he says he does not need A   Verbal support given   Medications administered and effectiveness monitored   Q 15 min checks R   Pt safe at present

## 2015-11-29 NOTE — Progress Notes (Signed)
Adult Psychoeducational Group Note  Date:  11/29/2015 Time:  2:00 PM  Group Topic/Focus:  Personal Choices and Values:   The focus of this group is to help patients assess and explore the importance of values in their lives, how their values affect their decisions, how they express their values and what opposes their expression.  Participation Level:  Active  Participation Quality:  Appropriate  Affect:  Appropriate  Cognitive:  Appropriate  Insight: Appropriate  Engagement in Group:  Engaged  Modes of Intervention:  Discussion and Education  Additional Comments:  Pt was able to share positively with the group this morning.  Brad Barr E 11/29/2015, 2:00 PM

## 2015-11-29 NOTE — Plan of Care (Signed)
Problem: Ineffective individual coping Goal: STG: Pt will be able to identify effective and ineffective STG: Pt will be able to identify effective and ineffective coping patterns  Outcome: Progressing Patient attends and participates in all groups and milieu on the unit.

## 2015-11-29 NOTE — B[facility] Group Notes (Signed)
BHH LCSW Group Therapy  11/29/2015 4:18 PM   Type of Therapy:  Group Therapy  Participation Level:  Active  Participation Quality:  Attentive  Affect:  Appropriate  Cognitive:  Appropriate  Insight:  Improving  Engagement in Therapy:  Engaged  Modes of Intervention:  Clarification, Education, Exploration and Socialization  Summary of Progress/Problems: Today's group focused on relapse prevention.  We defined the term, and then brainstormed on ways to prevent relapse.  Pt was on phone as group was starting.  Yelling at person on the other end.  He did not attend group.  Later found out it was father at whom he was yelling.  Subsequently, he and Dr Elna BreslowEappen called father together to have a discussion about dispositional plan.  Brad Geraldorth, Brad Barr B 11/29/2015 , 4:18 PM

## 2015-11-29 NOTE — Progress Notes (Signed)
[facility]  11/29/2015 12:24 PM Rexene EdisonJohn N Straughter  MRN:  [id number] Subjective:  Patient states " Its not me , but my sister who is abusing drugs . I went to jail for DWI and my sister was at my house during that time. Its her meth pipe that my dad found."  Objective:[name] N Fortino Sicunn is a 39 y.o.caucasian male, who is self employed , lives by self in [place] , is single, who [facility]. Pt thereafter was petitioned to be IVCed by father - Mr.[name] Fortino Sicunn.  Per IVC petition " Pt was delusional and threatened to hurt father.'  Patient seen and chart reviewed today.Discussed patient with treatment team.  Pt today continues to lack insight in to his substance abuse - he completely denies it and states that he did not say anything about hurting his family and that his father may be delusional for saying such things. Pt presents as mildly anxious - more so because of his situation. Pt has been tolerating his medications. Pt reports he is willing to follow up with a therapist at family services and follow up with them on a regular basis. Will continue to observe pt on the unit. Per staff - no disruptive issues noted on the unit.Pt has been refusing his Haldol- states he is not psychotic , but compliant on celexa.     Principal Problem: Substance-induced psychotic disorder with delusions (HCC) ( stimulants,BZD)  Diagnosis:   Patient Active Problem List   Diagnosis Date Noted  . 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 GAD (generalized anxiety disorder) [F41.1] 11/07/2015  . 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 Pt denies hx of suicide attempts  Past Medical History:  Past Medical History  Diagnosis Date  . Schizophrenia (HCC)   . ADHD (attention deficit hyperactivity disorder)    History reviewed. No pertinent past surgical history. Family History:  Family History  Problem Relation Age of Onset  . Mental illness Other    Family Psychiatric  History: Pt reports " My whole family has mental illness.'Not able to elaborate further.But does state that his sister has hx of drug abuse. Social History: Pt is single , self employed, works for [employer] . Pt reports that he has a pending DUI.  History  Alcohol Use  . Yes     History  Drug Use  . Yes  . Special: Amphetamines    Social History   Social History  . Marital Status: Single    Spouse Name: N/A  . Number of Children: N/A  . Years of Education: N/A   Social History Main Topics  . Smoking status: Current Some Day Smoker  . Smokeless tobacco: None  . Alcohol Use: Yes  . Drug Use: Yes    Special: Amphetamines  . Sexual Activity: Not Asked   Other Topics Concern  . None   Social History Narrative   Additional Social History:    Pain Medications: see mar Prescriptions: see mar Over the Counter: see mar History of alcohol / drug use?: Yes Negative Consequences of Use: Personal relationships  Sleep: Fair  Appetite:  Fair  Current Medications: Current Facility-Administered Medications  Medication Dose Route Frequency Provider Last Rate Last Dose  . acetaminophen (TYLENOL) tablet 650 mg  650 mg Oral Q6H PRN Kerry Hough, PA-C      . alum & mag hydroxide-simeth (MAALOX/MYLANTA) 200-200-20 MG/5ML suspension 30 mL  30 mL Oral Q4H PRN Kerry Hough, PA-C      . benztropine (COGENTIN) tablet 0.5 mg  0.5 mg Oral QHS Kerry Hough, PA-C   0.5 mg at 11/28/15 2114  . citalopram (CELEXA) tablet 10 mg  10 mg Oral Daily Kerry Hough, PA-C   10 mg at 11/29/15 0847  . guaiFENesin (MUCINEX) 12  hr tablet 600 mg  600 mg Oral BID PRN Kerry Hough, PA-C   600 mg at 11/29/15 1610  . haloperidol (HALDOL) tablet 5 mg  5 mg Oral QHS Jomarie Longs, MD   5 mg at 11/28/15 2114  . hydrOXYzine (ATARAX/VISTARIL) tablet 25 mg  25 mg Oral Q6H PRN Kerry Hough, PA-C      . LORazepam (ATIVAN) tablet 1 mg  1 mg Oral Q6H PRN Jomarie Longs, MD   1 mg at 11/28/15 2116   Or  . LORazepam (ATIVAN) injection 1 mg  1 mg Intramuscular Q6H PRN Jomarie Longs, MD      . magnesium hydroxide (MILK OF MAGNESIA) suspension 30 mL  30 mL Oral Daily PRN Kerry Hough, PA-C      . traZODone (DESYREL) tablet 50 mg  50 mg Oral QHS,MR X 1 Kerry Hough, PA-C   50 mg at 11/28/15 2114    Lab Results: No results found for this or any previous visit (from the past 48 hour(s)).  Physical Findings: AIMS: Facial and Oral Movements Muscles of Facial Expression: None, normal Lips and Perioral Area: None, normal Jaw: None, normal Tongue: None, normal,Extremity Movements Upper (arms, wrists, hands, fingers): None, normal Lower (legs, knees, ankles, toes): None, normal, Trunk Movements Neck, shoulders, hips: None, normal, Overall Severity Severity of abnormal movements (highest score from questions above): None, normal Incapacitation due to abnormal movements: None, normal Patient's awareness of abnormal movements (rate only patient's report): No Awareness, Dental Status Current problems with teeth and/or dentures?: No Does patient usually wear dentures?: No  CIWA:    COWS:     Musculoskeletal: Strength & Muscle Tone: within normal limits Gait & Station: normal Patient leans: N/A  Psychiatric Specialty Exam: Review of Systems  Psychiatric/Behavioral: Positive for substance abuse. The patient is nervous/anxious.   All other systems reviewed and are negative.   Blood pressure 123/80, pulse 66, temperature 97.7 F (36.5 C), temperature source Oral, resp. rate 20, height  (1.88 m), weight 95.255 kg (210  lb).Body mass index is 26.95 kg/(m^2).  General Appearance: Fairly Groomed  Patent attorney::  Fair  Speech:  Clear and Coherent  Volume:  Normal  Mood:  Anxious  Affect:  Appropriate  Thought Process:  Coherent  Orientation:  Full (Time, Place, and Person)  Thought Content:  WDL  Suicidal Thoughts:  No  Homicidal Thoughts:  No  Memory:  Immediate;   Fair Recent;   Fair Remote;   Fair  Judgement:  Impaired  Insight:  Shallow  Psychomotor Activity:  Restlessness  Concentration:  Fair  Recall:  [employer] of Knowledge:Fair  Language: Fair  Akathisia:  No  Handed:  Right  AIMS (if indicated):     Assets:  Desire for Improvement Social Support  ADL's:  Intact  Cognition: WNL  Sleep:  Number of Hours: 6.75    11/28/15 Patient's father returned writer's call - per him patient was delusional and agitated and ripped apart his apartment and also threatened to kill his family. Pt likely abusing methamphetamines since he found meth pipes in his apartment. Pt also was using BZD - unknown how much - likely buying it off the streets.Per father , he is going to take a restraining order against pt. Father also states that he has been paying his rent and will now stop paying it and that he is going to be evicted from apartment.    Treatment Plan Summary:Patient presented after IVC ed by family for aggression and threatening to hurt them. Pt will continued to be observed on the unit. Willing to be referred for therapy. Will have CSW follow up.  Daily contact with patient to assess and evaluate symptoms and progress in treatment and Medication management  Will continue Celexa 10 mg po daily for affective sx. Will continue  Haldol 5 mg po qhs for mood lability/psychosis.Patient has been refusing it - states he was never delusional and has no psychosis. Will continue Cogentin 0.5 mg po qhs for EPS. Will continue Trazodone 50 mg po qhs for sleep. Will make available PRN medications as per  agitation protocol. Will continue to monitor vitals ,medication compliance and treatment side effects while patient is here.  Will monitor for medical issues as well as call consult as needed.  Reviewed labs- HBA1C, LIPID PANEL, TSH ( 11/07/15) - WNL , PL -(11/07/15) - slightly elevated ,UDS- pos for amphetamines, BAL<5.  CSW will start working on disposition.  Patient to participate in therapeutic milieu .   Keana Dueitt MD 11/29/2015, 12:24 PM

## 2015-11-29 NOTE — [facility] Suicide Risk Assessment (Signed)
[facility] INPATIENT:  Family/Significant Other Suicide Prevention Education  Suicide Prevention Education:  Patient Refusal for Family/Significant Other Suicide Prevention Education: The patient Brad Barr has refused to provide written consent for family/significant other to be provided Family/Significant Other Suicide Prevention Education during admission and/or prior to discharge.  Physician notified.  [name], Elija Mccamish B 11/29/2015, 4:25 PM

## 2015-11-29 NOTE — Progress Notes (Signed)
Adult Psychoeducational Group Note  Date:  11/29/2015 Time:  9:03 PM  Group Topic/Focus:  Wrap-Up Group:   The focus of this group is to help patients review their daily goal of treatment and discuss progress on daily workbooks.  Participation Level:  Active  Participation Quality:  Attentive  Affect:  Appropriate  Cognitive:  Appropriate  Insight: Good  Engagement in Group:  Engaged  Modes of Intervention:  Activity  Additional Comments:  Patient rated his day a 9. Stated he had a very good day.  Claria DiceKiara M Devinne Epstein 11/29/2015, 9:03 PM

## 2015-11-30 MED ORDER — TRAZODONE HCL 50 MG PO TABS: 25.0000 mg | ORAL_TABLET | Freq: Every day | ORAL | Status: DC

## 2015-11-30 MED ORDER — BACITRACIN-NEOMYCIN-POLYMYXIN OINTMENT TUBE: 1.0000 "application " | TOPICAL_OINTMENT | Freq: Two times a day (BID) | CUTANEOUS | Status: DC

## 2015-11-30 MED ORDER — BENZTROPINE MESYLATE 0.5 MG PO TABS: 0.5000 mg | ORAL_TABLET | Freq: Every day | ORAL | Status: DC

## 2015-11-30 MED ORDER — HYDROXYZINE HCL 25 MG PO TABS: 25.0000 mg | ORAL_TABLET | Freq: Four times a day (QID) | ORAL | Status: DC | PRN

## 2015-11-30 MED ORDER — GUAIFENESIN ER 600 MG PO TB12: 600.0000 mg | ORAL_TABLET | Freq: Two times a day (BID) | ORAL | Status: DC | PRN

## 2015-11-30 MED ORDER — CITALOPRAM HYDROBROMIDE 10 MG PO TABS: 10.0000 mg | ORAL_TABLET | Freq: Every day | ORAL | Status: DC

## 2015-11-30 MED ORDER — HALOPERIDOL 5 MG PO TABS: 5.0000 mg | ORAL_TABLET | Freq: Every day | ORAL | Status: DC

## 2015-11-30 MED ORDER — TRAZODONE 25 MG HALF TABLET
25.0000 mg | ORAL_TABLET | Freq: Every day | ORAL | Status: DC
  Filled 2015-11-30: qty 4
  Filled 2015-11-30: qty 1
  Filled 2015-11-30: qty 4

## 2015-11-30 NOTE — Progress Notes (Signed)
Patient  discharged home with samples and prescriptions. Patient was stable and appreciative at that time. All papers and prescriptions were given and valuables returned. Verbal understanding expressed. Denies SI/HI and A/VH. Patient given opportunity to express concerns and ask questions.  
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
## 2015-11-30 NOTE — Progress Notes (Signed)
D   Pt is pleasant on approach and cooperative  He has not made any delusional statements and has been compliant with the exception of taking certain medications which he says he does not need A   Verbal support given   Medications administered and effectiveness monitored   Q 15 min checks R   Pt safe at present 

## 2015-11-30 NOTE — BHH Suicide Risk Assessment (Signed)
[facility]   Demographic Factors:  Male and Caucasian  Total Time spent with patient: 30 minutes  Musculoskeletal: Strength & Muscle Tone: within normal limits Gait & Station: normal Patient leans: N/A  Psychiatric Specialty Exam: Physical Exam  Review of Systems  Psychiatric/Behavioral: Positive for substance abuse. Negative for depression and suicidal ideas. The patient is nervous/anxious (IMPROVED).   All other systems reviewed and are negative.   Blood pressure 121/77, pulse 72, temperature 97.9 F (36.6 C), temperature source Oral, resp. rate 16, height 6\' 2"  (1.88 m), weight 95.255 kg (210 lb).Body mass index is 26.95 kg/(m^2).  General Appearance: Casual  Eye Contact::  Fair  Speech:  Clear and Coherent409  Volume:  Normal  Mood:  Anxious  Affect:  Appropriate  Thought Process:  Coherent  Orientation:  Full (Time, Place, and Person)  Thought Content:  WDL  Suicidal Thoughts:  No  Homicidal Thoughts:  No  Memory:  Immediate;   Fair Recent;   Fair Remote;   Fair  Judgement:  Fair  Insight:  Fair  Psychomotor Activity:  Normal  Concentration:  Fair  Recall:  FiservFair  Fund of Knowledge:Fair  Language: Fair  Akathisia:  No  Handed:  Right  AIMS (if indicated):     Assets:  Communication Skills Desire for Improvement  Sleep:  Number of Hours: 6.2  Cognition: WNL  ADL's:  Intact   Have you used any form of tobacco in the last 30 days? (Cigarettes, Smokeless Tobacco, Cigars, and/or Pipes): No  Has this patient used any form of tobacco in the last 30 days? (Cigarettes, Smokeless Tobacco, Cigars, and/or Pipes) No  Mental Status Per Nursing Assessment::   On Admission:     Current Mental Status by Physician: PT DENIES SI/HI/AH/VH  Loss Factors: NA  Historical Factors: Impulsivity  Risk Reduction Factors:   Positive coping skills or problem solving skills  Continued Clinical Symptoms:  Alcohol/Substance  Abuse/Dependencies  Cognitive Features That Contribute To Risk:  Polarized thinking    Suicide Risk:  Minimal: No identifiable suicidal ideation.  Patients presenting with no risk factors but with morbid ruminations; may be classified as minimal risk based on the severity of the depressive symptoms  Principal Problem: Substance-induced psychotic disorder with delusions (HCC) ( STIMULANTS , [facility]) Discharge Diagnoses:  Patient Active Problem List   Diagnosis Date Noted  . 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 GAD (generalized anxiety disorder) [F41.1] 11/07/2015  . 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   Why:  Go for an assessment for services [substance abuse groups] on Tuesday, January 3 at [facility].   Contact information:   [address] [place]  [phone]      [facility]   Specialty:  Behavioral Health   Why:  Go to the walk-in clinic M-F between 8 and 11AM for your hospital follow up appointment   Contact information:   [address] [place] [address] [phone]       Plan Of Care/Follow-up recommendations:  Activity:  No restrictions Diet:  regular Tests:  as needed Other:  follow up with after care  Is patient on multiple antipsychotic therapies at discharge:  No   Has Patient had three or more failed trials of antipsychotic  monotherapy by history:  No  Recommended Plan for Multiple Antipsychotic Therapies: NA    Jerlisa Diliberto MD 11/30/2015, 9:34 AM

## 2015-12-08 ENCOUNTER — Emergency Department (HOSPITAL_COMMUNITY)
Admission: EM | Admit: 2015-12-08 | Discharge: 2015-12-11 | Disposition: A | Discharge status: Home / Self Care | Payer: Self-pay | Attending: Emergency Medicine | Admitting: Emergency Medicine

## 2015-12-08 ENCOUNTER — Encounter (HOSPITAL_COMMUNITY): Payer: Self-pay | Admitting: Oncology

## 2015-12-08 DIAGNOSIS — F1590 Other stimulant use, unspecified, uncomplicated: Secondary | ICD-10-CM

## 2015-12-08 DIAGNOSIS — F13250 Sedative, hypnotic or anxiolytic dependence with sedative, hypnotic or anxiolytic-induced psychotic disorder with delusions: Secondary | ICD-10-CM | POA: Insufficient documentation

## 2015-12-08 DIAGNOSIS — F15250 Other stimulant dependence with stimulant-induced psychotic disorder with delusions: Secondary | ICD-10-CM | POA: Insufficient documentation

## 2015-12-08 DIAGNOSIS — Z79899 Other long term (current) drug therapy: Secondary | ICD-10-CM | POA: Insufficient documentation

## 2015-12-08 DIAGNOSIS — R45850 Homicidal ideations: Secondary | ICD-10-CM

## 2015-12-08 DIAGNOSIS — F19950 Other psychoactive substance use, unspecified with psychoactive substance-induced psychotic disorder with delusions: Secondary | ICD-10-CM | POA: Diagnosis present

## 2015-12-08 DIAGNOSIS — F17200 Nicotine dependence, unspecified, uncomplicated: Secondary | ICD-10-CM | POA: Insufficient documentation

## 2015-12-08 LAB — COMPREHENSIVE METABOLIC PANEL
ALBUMIN: 4.1 g/dL (ref 3.5–5.0)
ALK PHOS: 74 U/L (ref 38–126)
ALT: 21 U/L (ref 17–63)
ANION GAP: 7 (ref 5–15)
AST: 21 U/L (ref 15–41)
BILIRUBIN TOTAL: 0.6 mg/dL (ref 0.3–1.2)
BUN: 22 mg/dL — ABNORMAL HIGH (ref 6–20)
CALCIUM: 9.5 mg/dL (ref 8.9–10.3)
CO2: 25 mmol/L (ref 22–32)
CREATININE: 0.67 mg/dL (ref 0.61–1.24)
Chloride: 107 mmol/L (ref 101–111)
GFR calc non Af Amer: >60 mL/min (ref >60)
GLUCOSE: 107 mg/dL — AB (ref 65–99)
Potassium: 4.1 mmol/L (ref 3.5–5.1)
SODIUM: 139 mmol/L (ref 135–145)
TOTAL PROTEIN: 6.9 g/dL (ref 6.5–8.1)

## 2015-12-08 LAB — CBC
HEMATOCRIT: 43.1 % (ref 39.0–52.0)
HEMOGLOBIN: 14.6 g/dL (ref 13.0–17.0)
MCH: 28.3 pg (ref 26.0–34.0)
MCHC: 33.9 g/dL (ref 30.0–36.0)
MCV: 83.5 fL (ref 78.0–100.0)
Platelets: 220 10*3/uL (ref 150–400)
RBC: 5.16 MIL/uL (ref 4.22–5.81)
RDW: 13.6 % (ref 11.5–15.5)
WBC: 12.7 10*3/uL — ABNORMAL HIGH (ref 4.0–10.5)

## 2015-12-08 LAB — SALICYLATE LEVEL: Salicylate Lvl: <4 mg/dL (ref 2.8–30.0)

## 2015-12-08 LAB — ACETAMINOPHEN LEVEL

## 2015-12-08 LAB — ETHANOL: Alcohol, Ethyl (B): <5 mg/dL (ref <5)

## 2015-12-08 MED ORDER — HALOPERIDOL 5 MG PO TABS
5.0000 mg | ORAL_TABLET | Freq: Every day | ORAL | Status: DC
  Filled 2015-12-08: qty 1

## 2015-12-08 MED ORDER — TRAZODONE HCL 50 MG PO TABS
25.0000 mg | ORAL_TABLET | Freq: Every day | ORAL | Status: DC
  Administered 2015-12-10: 25 mg via ORAL
  Filled 2015-12-08 (×2): qty 1

## 2015-12-08 MED ORDER — ZIPRASIDONE MESYLATE 20 MG IM SOLR
20.0000 mg | Freq: Once | INTRAMUSCULAR | Status: AC
  Administered 2015-12-08: 20 mg via INTRAMUSCULAR
  Filled 2015-12-08: qty 20

## 2015-12-08 MED ORDER — ACETAMINOPHEN 325 MG PO TABS: 650.0000 mg | ORAL_TABLET | ORAL | Status: DC | PRN

## 2015-12-08 MED ORDER — IBUPROFEN 200 MG PO TABS
600.0000 mg | ORAL_TABLET | Freq: Three times a day (TID) | ORAL | Status: DC | PRN
  Administered 2015-12-11: 600 mg via ORAL
  Filled 2015-12-08: qty 3

## 2015-12-08 MED ORDER — STERILE WATER FOR INJECTION IJ SOLN
INTRAMUSCULAR | Status: AC
  Administered 2015-12-08: 10 mL
  Filled 2015-12-08: qty 10

## 2015-12-08 MED ORDER — BENZTROPINE MESYLATE 1 MG PO TABS
0.5000 mg | ORAL_TABLET | Freq: Every day | ORAL | Status: DC
  Filled 2015-12-08: qty 1

## 2015-12-08 MED ORDER — ONDANSETRON HCL 4 MG PO TABS: 4.0000 mg | ORAL_TABLET | Freq: Three times a day (TID) | ORAL | Status: DC | PRN

## 2015-12-08 MED ORDER — HYDROXYZINE HCL 25 MG PO TABS
25.0000 mg | ORAL_TABLET | Freq: Four times a day (QID) | ORAL | Status: DC | PRN
  Filled 2015-12-08: qty 1

## 2015-12-08 MED ORDER — CITALOPRAM HYDROBROMIDE 10 MG PO TABS
10.0000 mg | ORAL_TABLET | Freq: Every day | ORAL | Status: DC
  Administered 2015-12-11: 10 mg via ORAL
  Filled 2015-12-08 (×3): qty 1

## 2015-12-08 MED ORDER — ALUM & MAG HYDROXIDE-SIMETH 200-200-20 MG/5ML PO SUSP: 30.0000 mL | ORAL | Status: DC | PRN

## 2015-12-08 MED ORDER — GUAIFENESIN ER 600 MG PO TB12
600.0000 mg | ORAL_TABLET | Freq: Two times a day (BID) | ORAL | Status: DC | PRN
  Filled 2015-12-08: qty 1

## 2015-12-08 NOTE — BH Assessment (Signed)
Assessment completed. Consulted Ijeoma NwaezeHulan Fess, NP who recommended inpatient treatment. TTS to seek placement. Informed Dr. Verdie MosherLiu of recommendation.
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
## 2015-12-08 NOTE — ED Notes (Signed)
Pt. To [facility] from ED ambulatory without difficulty, to room 38 . Report from Autumn RN. Pt. Is alert and oriented, warm and dry in no distress. Pt. Denies SI, HI, and AVH. Pt. Calm and cooperative. Pt. Made aware of security cameras and Q15 minute rounds. Pt. Encouraged to let Nursing staff know of any concerns or needs.

## 2015-12-08 NOTE — BH Assessment (Signed)
Pt's father's number is [phone].
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
## 2015-12-08 NOTE — BH Assessment (Signed)
Attempted to gather additional information from the petitioner; however he did not answer his phone and this writer was unable to leave a message. Will call at a later time.
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
## 2015-12-09 DIAGNOSIS — R45850 Homicidal ideations: Secondary | ICD-10-CM | POA: Insufficient documentation

## 2015-12-09 MED ORDER — ZIPRASIDONE MESYLATE 20 MG IM SOLR
20.0000 mg | Freq: Once | INTRAMUSCULAR | Status: AC
  Administered 2015-12-09: 20 mg via INTRAMUSCULAR

## 2015-12-09 MED ORDER — STERILE WATER FOR INJECTION IJ SOLN
INTRAMUSCULAR | Status: AC
  Administered 2015-12-09: 19:00:00
  Filled 2015-12-09: qty 10

## 2015-12-09 MED ORDER — ZIPRASIDONE MESYLATE 20 MG IM SOLR
INTRAMUSCULAR | Status: AC
  Filled 2015-12-09: qty 20

## 2015-12-09 NOTE — ED Notes (Signed)
Pt. Noted sleeping in room. No complaints or concerns voiced. No distress or abnormal behavior noted. Will continue to monitor with security cameras. Q 15 minute rounds continue. 
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
## 2015-12-09 NOTE — ED Notes (Signed)
Pt refused 1200 vital signs.
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
## 2015-12-09 NOTE — ED Notes (Signed)
Dr Hyacinth MeekerMiller updated, orders recieved

## 2015-12-09 NOTE — ED Notes (Signed)
Pt. Still argumentative and uncooperative.

## 2015-12-09 NOTE — ED Notes (Addendum)
Pt up to the bathroom to shower and change scrubs. Pt accidently knocked over the bedside table when he got out of bed.  Pt picked up table and cleaned up his room.

## 2015-12-10 NOTE — ED Notes (Signed)
Pt. Noted sleeping in room. No complaints or concerns voiced. No distress or abnormal behavior noted. Will continue to monitor with security cameras. Q 15 minute rounds continue. 

## 2015-12-10 NOTE — Consult Note (Signed)
Psychiatric Specialty Exam: Physical Exam  ROS  Blood pressure 120/70, pulse 66, temperature 98.3 F (36.8 C), temperature source Oral, resp. rate 20, SpO2 98 %.There is no weight on file to calculate BMI.  General Appearance: unable to assess, covered from head to toes  Eye Contact:: None  Speech: Pressured and loud  Volume: Increased  Mood: Angry and Irritable  Affect: Congruent  Thought Process: Disorganized, Irrelevant and Loose  Orientation: Other: unable to obtain, yelling at providers to f... leave his room  Thought Content: unable to obtain  Suicidal Thoughts: unable to obtain  Homicidal Thoughts: unable to obtain, refused to answer question.  Memory: refused to participate or answer question, instructed providers to leave his room  Judgement: Poor  Insight: Lacking  Psychomotor Activity: Psychomotor Retardation  Concentration: Poor  Recall: unable to obtain  Fund of Knowledge:Poor  Language: Poor  Akathisia: No  Handed: Right  AIMS (if indicated):    Assets: Others: unable to obtain  ADL's: Intact  Cognition: WNL         Patient remains angry and irritable towards his father and staff members.  He is refusing to answer questions appropriately and asked providers not to ask the same "f...king " questions all over".  He had his whole body covered up from head to toe and at the urging of Dr Jama Flavorsobos he removed the cover for a second and covered his face again.  He is refusing his medications and want to be discharged.   He did not want to answer  Questions regarding his  bruised eyes.  Patient referred to his father as crazy for bringing him to the hospital.  Efforts to gather collateral information from his father has failed as he is not answering his phone.  He denies SI/HI/AVH.  Stimulant use disorder (HCC)   Plan: Continue inpatient hospitalization and seek placement.  Dahlia ByesJosephine Onuoha  PMHNP-BC  Agree with NP Note and  Assessment as above

## 2015-12-10 NOTE — ED Notes (Signed)
Report received from LuAnn RN. Pt. Alert and oriented in no distress denies SI, HI, AVH and pain.  Pt. Instructed to come to me with problems or concerns.Will continue to monitor for safety via security cameras and Q 15 minute checks.  

## 2015-12-10 NOTE — ED Notes (Signed)
Pt. Noted in room. No complaints or concerns voiced. No distress or abnormal behavior noted. Will continue to monitor with security cameras. Q 15 minute rounds continue. Requested and given ice water.

## 2015-12-10 NOTE — ED Notes (Signed)
Pt. Noted in room. No complaints or concerns voiced. No distress or abnormal behavior noted. Will continue to monitor with security cameras. Q 15 minute rounds continue. 

## 2015-12-10 NOTE — ED Notes (Signed)
Pt. Noted in room. No complaints or concerns voiced. No distress or abnormal behavior noted. Will continue to monitor with security cameras. Q 15 minute rounds continue. Pt. Inquiring about his care plan. Pt. States he wishes to be discharged so he can go back to work. Pt. Calmly talking about what he needs to do to be discharged. Pt. Advised to cooperate with treatment and to take prescribed medication. Pt. Agrees to continue to cooperate.

## 2015-12-10 NOTE — Progress Notes (Signed)
CSW attempted pt's father, Ria CommentJohn Ellender Sr [phone]) twice. Left message. CSW to continue to follow.  York SpanielAlexandra Kipp Shank [facility] North HoustonCSWA Clinical Social Worker [facility] phone: [phone]

## 2015-12-11 DIAGNOSIS — F19950 Other psychoactive substance use, unspecified with psychoactive substance-induced psychotic disorder with delusions: Secondary | ICD-10-CM

## 2015-12-11 DIAGNOSIS — F1590 Other stimulant use, unspecified, uncomplicated: Secondary | ICD-10-CM

## 2015-12-11 NOTE — ED Notes (Signed)
Pt is calm and cooperative.  Discharge instructions given and reviewed.  All belongings were returned to pt.

## 2015-12-11 NOTE — ED Notes (Signed)
Pt. Noted sleeping in room. No complaints or concerns voiced. No distress or abnormal behavior noted. Will continue to monitor with security cameras. Q 15 minute rounds continue. 
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
## 2015-12-11 NOTE — Discharge Instructions (Signed)
For your ongoing behavioral health needs you are advised to [facility]  If you do not currently have an appointment, new patients are seen at their walk-in clinic.  Walk-in hours are Monday - Friday from 8:00 am - 12:00 pm, and from 1:00 pm - 3:00 pm.  Walk-in patients are seen on a first come, first served basis, so try to arrive as early as possible for the best chance of being seen the same day.  There is an initial fee of $22.50:       [facility]      [address]      [place], [place] [address]      [phone]

## 2015-12-11 NOTE — [facility] Suicide Risk Assessment (Signed)
Suicide Risk Assessment  Discharge Assessment   [facility]   Demographic Factors:  Male, Caucasian and Living alone  Total Time spent with patient: 30 minutes  Musculoskeletal: Strength & Muscle Tone: within normal limits Gait & Station: normal Patient leans: N/A  Psychiatric Specialty Exam: Review of Systems  Constitutional: Negative.   HENT: Negative.   Eyes: Negative.   Respiratory: Negative.   Cardiovascular: Negative.   Gastrointestinal: Negative.   Genitourinary: Negative.   Musculoskeletal: Negative.   Skin: Negative.   Neurological: Negative.   Endo/Heme/Allergies: Negative.   Psychiatric/Behavioral: Positive for substance abuse.    Blood pressure 107/68, pulse 89, temperature 98.3 F (36.8 C), temperature source Oral, resp. rate 16, SpO2 98 %.There is no weight on file to calculate BMI.  General Appearance: Casual  Eye Contact::  Good  Speech:  Normal Rate  Volume:  Normal  Mood:  Euthymic  Affect:  Congruent  Thought Process:  Coherent  Orientation:  Full (Time, Place, and Person)  Thought Content:  WDL  Suicidal Thoughts:  No  Homicidal Thoughts:  No  Memory:  Immediate;   Good Recent;   Good Remote;   Good  Judgement:  Fair  Insight:  Fair  Psychomotor Activity:  Normal  Concentration:  Good  Recall:  Good  Fund of Knowledge:Good  Language: Good  Akathisia:  No  Handed:  Right  AIMS (if indicated):     Assets:  Housing Leisure Time Physical Health Resilience Social Support Vocational/Educational  ADL's:  Intact  Cognition: WNL  Sleep:         Has this patient used any form of tobacco in the last 30 days? (Cigarettes, Smokeless Tobacco, Cigars, and/or Pipes) Yes, A prescription for an FDA-approved tobacco cessation medication was offered at discharge and the patient refused  Mental Status Per Nursing Assessment::   On Admission:   amphetamine abuse  Current Mental Status by Physician: NA  Loss  Factors: NA  Historical Factors: Impulsivity  Risk Reduction Factors:   Sense of responsibility to family, Employed and Positive social support  Continued Clinical Symptoms:  None  Cognitive Features That Contribute To Risk:  None    Suicide Risk:  Minimal: No identifiable suicidal ideation.  Patients presenting with no risk factors but with morbid ruminations; may be classified as minimal risk based on the severity of the depressive symptoms  Principal Problem: Substance-induced psychotic disorder with delusions [facility](HCC) Discharge Diagnoses:  Patient Active Problem List   Diagnosis Date Noted  . Substance-induced psychotic disorder with delusions [facility](HCC) [F19.950] 11/28/2015    Priority: High  . 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 Homicidal ideation [R45.850]   . 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 GAD (generalized anxiety disorder) [F41.1] 11/07/2015  . 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

## 2015-12-11 NOTE — BH Assessment (Signed)
[name]  Per Thedore MinsMojeed Akintayo, MD, this pt does not require psychiatric hospitalization at this time.  He presents under IVC initiated by his father, Ardeth SportsmanJohn M. Dehne.  Petition is to be rescinded and [facility].  Petition has been rescinded.  Referral information has been included in pt's discharge instructions.  Pt's nurse, Kendal Hymendie, has been notified.  Doylene Canninghomas Jubal Rademaker, MA Triage Specialist [phone]

## 2016-02-16 ENCOUNTER — Encounter (HOSPITAL_COMMUNITY): Payer: Self-pay

## 2016-02-16 ENCOUNTER — Emergency Department (HOSPITAL_COMMUNITY)
Admission: EM | Admit: 2016-02-16 | Discharge: 2016-02-20 | Disposition: A | Discharge status: Other Acute Inpatient Hospital | Payer: Self-pay | Attending: Emergency Medicine | Admitting: Emergency Medicine

## 2016-02-16 DIAGNOSIS — Z792 Long term (current) use of antibiotics: Secondary | ICD-10-CM | POA: Insufficient documentation

## 2016-02-16 DIAGNOSIS — Y9289 Other specified places as the place of occurrence of the external cause: Secondary | ICD-10-CM | POA: Insufficient documentation

## 2016-02-16 DIAGNOSIS — S50811A Abrasion of right forearm, initial encounter: Secondary | ICD-10-CM | POA: Insufficient documentation

## 2016-02-16 DIAGNOSIS — F200 Paranoid schizophrenia: Secondary | ICD-10-CM | POA: Insufficient documentation

## 2016-02-16 DIAGNOSIS — S0081XA Abrasion of other part of head, initial encounter: Secondary | ICD-10-CM | POA: Insufficient documentation

## 2016-02-16 DIAGNOSIS — F19950 Other psychoactive substance use, unspecified with psychoactive substance-induced psychotic disorder with delusions: Secondary | ICD-10-CM | POA: Diagnosis present

## 2016-02-16 DIAGNOSIS — S7010XA Contusion of unspecified thigh, initial encounter: Secondary | ICD-10-CM | POA: Insufficient documentation

## 2016-02-16 DIAGNOSIS — Y9389 Activity, other specified: Secondary | ICD-10-CM | POA: Insufficient documentation

## 2016-02-16 DIAGNOSIS — Z79899 Other long term (current) drug therapy: Secondary | ICD-10-CM | POA: Insufficient documentation

## 2016-02-16 DIAGNOSIS — S0003XA Contusion of scalp, initial encounter: Secondary | ICD-10-CM | POA: Insufficient documentation

## 2016-02-16 DIAGNOSIS — S50812A Abrasion of left forearm, initial encounter: Secondary | ICD-10-CM | POA: Insufficient documentation

## 2016-02-16 DIAGNOSIS — Y998 Other external cause status: Secondary | ICD-10-CM | POA: Insufficient documentation

## 2016-02-16 DIAGNOSIS — R451 Restlessness and agitation: Secondary | ICD-10-CM | POA: Insufficient documentation

## 2016-02-16 DIAGNOSIS — S0990XA Unspecified injury of head, initial encounter: Secondary | ICD-10-CM | POA: Insufficient documentation

## 2016-02-16 DIAGNOSIS — X838XXA Intentional self-harm by other specified means, initial encounter: Secondary | ICD-10-CM | POA: Insufficient documentation

## 2016-02-16 DIAGNOSIS — H6122 Impacted cerumen, left ear: Secondary | ICD-10-CM | POA: Insufficient documentation

## 2016-02-16 DIAGNOSIS — F17200 Nicotine dependence, unspecified, uncomplicated: Secondary | ICD-10-CM | POA: Insufficient documentation
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
## 2016-02-17 ENCOUNTER — Encounter (HOSPITAL_COMMUNITY): Payer: Self-pay | Admitting: Emergency Medicine

## 2016-02-17 ENCOUNTER — Emergency Department (HOSPITAL_COMMUNITY): Payer: Self-pay

## 2016-02-17 DIAGNOSIS — F911 Conduct disorder, childhood-onset type: Secondary | ICD-10-CM

## 2016-02-17 LAB — ACETAMINOPHEN LEVEL: Acetaminophen (Tylenol), Serum: <10 ug/mL — ABNORMAL LOW (ref 10–30)

## 2016-02-17 LAB — COMPREHENSIVE METABOLIC PANEL
ALT: 20 U/L (ref 17–63)
AST: 25 U/L (ref 15–41)
Albumin: 4.4 g/dL (ref 3.5–5.0)
Alkaline Phosphatase: 85 U/L (ref 38–126)
Anion gap: 8 (ref 5–15)
BILIRUBIN TOTAL: 0.6 mg/dL (ref 0.3–1.2)
BUN: 9 mg/dL (ref 6–20)
CHLORIDE: 108 mmol/L (ref 101–111)
CO2: 23 mmol/L (ref 22–32)
CREATININE: 0.74 mg/dL (ref 0.61–1.24)
Calcium: 9.1 mg/dL (ref 8.9–10.3)
Glucose, Bld: 97 mg/dL (ref 65–99)
POTASSIUM: 3.9 mmol/L (ref 3.5–5.1)
Sodium: 139 mmol/L (ref 135–145)
TOTAL PROTEIN: 7.2 g/dL (ref 6.5–8.1)

## 2016-02-17 LAB — CBC
HCT: 39.8 % (ref 39.0–52.0)
Hemoglobin: 14.1 g/dL (ref 13.0–17.0)
MCH: 28.2 pg (ref 26.0–34.0)
MCHC: 35.4 g/dL (ref 30.0–36.0)
MCV: 79.6 fL (ref 78.0–100.0)
PLATELETS: 195 10*3/uL (ref 150–400)
RBC: 5 MIL/uL (ref 4.22–5.81)
RDW: 13.1 % (ref 11.5–15.5)
WBC: 9.9 10*3/uL (ref 4.0–10.5)

## 2016-02-17 LAB — RAPID URINE DRUG SCREEN, HOSP PERFORMED
AMPHETAMINES: NOT DETECTED
Barbiturates: NOT DETECTED
Benzodiazepines: NOT DETECTED
Cocaine: NOT DETECTED
OPIATES: NOT DETECTED
Tetrahydrocannabinol: NOT DETECTED

## 2016-02-17 LAB — ETHANOL

## 2016-02-17 LAB — SALICYLATE LEVEL: SALICYLATE LVL: 6.1 mg/dL (ref 2.8–30.0)

## 2016-02-17 MED ORDER — BENZTROPINE MESYLATE 1 MG PO TABS
0.5000 mg | ORAL_TABLET | Freq: Two times a day (BID) | ORAL | Status: DC
Start: 2016-02-17 — End: 2016-02-18
  Administered 2016-02-17 – 2016-02-18 (×2): 0.5 mg via ORAL
  Filled 2016-02-17: qty 1

## 2016-02-17 MED ORDER — LORAZEPAM 2 MG/ML IJ SOLN
2.0000 mg | Freq: Four times a day (QID) | INTRAMUSCULAR | Status: DC | PRN
  Administered 2016-02-17: 2 mg via INTRAMUSCULAR
  Filled 2016-02-17: qty 1

## 2016-02-17 MED ORDER — STERILE WATER FOR INJECTION IJ SOLN
INTRAMUSCULAR | Status: AC
  Administered 2016-02-17: 02:00:00
  Filled 2016-02-17: qty 10

## 2016-02-17 MED ORDER — ALUM & MAG HYDROXIDE-SIMETH 200-200-20 MG/5ML PO SUSP: 30.0000 mL | ORAL | Status: DC | PRN

## 2016-02-17 MED ORDER — TRAZODONE HCL 50 MG PO TABS
50.0000 mg | ORAL_TABLET | Freq: Every day | ORAL | Status: DC
  Administered 2016-02-17 – 2016-02-19 (×3): 50 mg via ORAL
  Filled 2016-02-17 (×3): qty 1

## 2016-02-17 MED ORDER — IBUPROFEN 200 MG PO TABS
600.0000 mg | ORAL_TABLET | Freq: Three times a day (TID) | ORAL | Status: DC | PRN
  Administered 2016-02-18 – 2016-02-19 (×2): 600 mg via ORAL
  Filled 2016-02-17 (×2): qty 3

## 2016-02-17 MED ORDER — HALOPERIDOL LACTATE 5 MG/ML IJ SOLN
5.0000 mg | Freq: Four times a day (QID) | INTRAMUSCULAR | Status: DC | PRN
  Administered 2016-02-17: 5 mg via INTRAMUSCULAR
  Filled 2016-02-17: qty 1

## 2016-02-17 MED ORDER — ONDANSETRON HCL 4 MG PO TABS: 4.0000 mg | ORAL_TABLET | Freq: Three times a day (TID) | ORAL | Status: DC | PRN

## 2016-02-17 MED ORDER — ACETAMINOPHEN 325 MG PO TABS: 650.0000 mg | ORAL_TABLET | ORAL | Status: DC | PRN

## 2016-02-17 MED ORDER — ZIPRASIDONE MESYLATE 20 MG IM SOLR
20.0000 mg | Freq: Once | INTRAMUSCULAR | Status: AC
  Administered 2016-02-17: 20 mg via INTRAMUSCULAR
  Filled 2016-02-17: qty 20

## 2016-02-17 MED ORDER — LORAZEPAM 1 MG PO TABS
1.0000 mg | ORAL_TABLET | Freq: Three times a day (TID) | ORAL | Status: DC | PRN
  Administered 2016-02-17: 1 mg via ORAL
  Filled 2016-02-17: qty 1
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
## 2016-02-17 NOTE — ED Notes (Signed)
Pt refused 1800 VS.

## 2016-02-17 NOTE — ED Notes (Signed)
Patient is calm and cooperative at this time. Patient denies SI, HI and AVH at this time. Plan of care discussed and patient voices no complaints or concerns. Patient given a snack, soda and a [place]urkey sandwich. Encouragement and support provided and safety maintain. Q 15 min safety checks remain in place.

## 2016-02-17 NOTE — Progress Notes (Signed)
Disposition CSW completed patient referrals to the following inpatient psych facilities:  [facility]  CSW will continue to follow patient for placement needs.  Seward SpeckLeo Laren Orama [facility] LL[facility]SW,LCAS Behavioral Health Disposition CSW [phone]

## 2016-02-17 NOTE — ED Notes (Signed)
Pt slept most of the morning and then woke up and kicked a hole in his wall. He said that he did it because he is angry that his father keeps blaming him for their problems and has him committed. After he did this twice, he was given medication IM. A show of support of security and police offers were present, but he allowed the medication to be given. He preferred it in his arm vs ventrogluteal. Given in Right Deltoid. After 15 minutes pt continued to be alert and requested to take a shower, which was allowed. He smiled some and was pleasant.

## 2016-02-17 NOTE — Clinical Social Work Note (Signed)
CSW provided psychiatry with IVC examination paperwork for pt to remain on IVC status.  Faxed to magistrates office, copy placed in chart and original in IVC folder  .Elray Bubaegina Loralai Eisman, LCSW [facility] Clinical Social Worker - Weekend Coverage cell #: [phone]

## 2016-02-18 DIAGNOSIS — F19950 Other psychoactive substance use, unspecified with psychoactive substance-induced psychotic disorder with delusions: Secondary | ICD-10-CM

## 2016-02-18 DIAGNOSIS — R45851 Suicidal ideations: Secondary | ICD-10-CM

## 2016-02-18 MED ORDER — HALOPERIDOL 5 MG PO TABS: 7.0000 mg | ORAL_TABLET | Freq: Two times a day (BID) | ORAL | Status: DC

## 2016-02-18 MED ORDER — BENZTROPINE MESYLATE 1 MG PO TABS
1.0000 mg | ORAL_TABLET | Freq: Two times a day (BID) | ORAL | Status: DC
  Administered 2016-02-18 – 2016-02-20 (×4): 1 mg via ORAL
  Filled 2016-02-18 (×5): qty 1

## 2016-02-18 MED ORDER — HALOPERIDOL 5 MG PO TABS: 5.0000 mg | ORAL_TABLET | Freq: Two times a day (BID) | ORAL | Status: DC

## 2016-02-18 MED ORDER — HALOPERIDOL 5 MG PO TABS
10.0000 mg | ORAL_TABLET | Freq: Two times a day (BID) | ORAL | Status: DC
  Administered 2016-02-18 – 2016-02-19 (×3): 10 mg via ORAL
  Administered 2016-02-19: 5 mg via ORAL
  Administered 2016-02-20: 10 mg via ORAL
  Filled 2016-02-18 (×5): qty 2

## 2016-02-18 MED ORDER — NICOTINE 21 MG/24HR TD PT24
21.0000 mg | MEDICATED_PATCH | Freq: Once | TRANSDERMAL | Status: AC
  Administered 2016-02-18: 21 mg via TRANSDERMAL
  Filled 2016-02-18: qty 1

## 2016-02-18 NOTE — ED Notes (Signed)
Ice packs requested and received for facial pain of 8.

## 2016-02-18 NOTE — ED Notes (Signed)
Patient has remained calm and cooperative during the night shift. Patient has not been hostile or aggressive. Encouragement and support provided and safety maintain. Q 15 min safety checks remain in place.

## 2016-02-18 NOTE — ED Notes (Signed)
Patient became agitated when told he could not have utensils.  Kicked the wall.  Verbal deecalation. Safety maintained.
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
## 2016-02-19 MED ORDER — NICOTINE 21 MG/24HR TD PT24
21.0000 mg | MEDICATED_PATCH | Freq: Every day | TRANSDERMAL | Status: DC
  Administered 2016-02-19 – 2016-02-20 (×2): 21 mg via TRANSDERMAL
  Filled 2016-02-19 (×2): qty 1

## 2016-02-19 NOTE — BH Assessment (Signed)
Patient was reassessed by TTS.   Patient was in his room in bed and laying under his blanket.  Patient denies SI and HI. When asked about HI, patient states "I've never had that." Patient denies AVH and states "no, I just want to get out, I'm tired of this room, I'm sorry I'm not talking right now."  Consulted with Dr. Jannifer FranklinAkintayo who continues to recommend inpatient treatment at this time.    Davina PokeJoVea Remy Voiles, LCSW Therapeutic Triage Specialist [facility] 02/19/2016 10:10 AM
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
## 2016-02-20 ENCOUNTER — Inpatient Hospital Stay (HOSPITAL_COMMUNITY)
Admission: AD | Admit: 2016-02-20 | Discharge: 2016-02-24 | DRG: 897 | Disposition: A | Discharge status: Home / Self Care | Payer: Federal, State, Local not specified - Other | Attending: Psychiatry | Admitting: Psychiatry

## 2016-02-20 ENCOUNTER — Encounter (HOSPITAL_COMMUNITY): Payer: Self-pay

## 2016-02-20 DIAGNOSIS — F29 Unspecified psychosis not due to a substance or known physiological condition: Secondary | ICD-10-CM | POA: Diagnosis present

## 2016-02-20 DIAGNOSIS — Z79899 Other long term (current) drug therapy: Secondary | ICD-10-CM

## 2016-02-20 DIAGNOSIS — R45850 Homicidal ideations: Secondary | ICD-10-CM | POA: Diagnosis present

## 2016-02-20 DIAGNOSIS — F1994 Other psychoactive substance use, unspecified with psychoactive substance-induced mood disorder: Principal | ICD-10-CM | POA: Diagnosis present

## 2016-02-20 DIAGNOSIS — F411 Generalized anxiety disorder: Secondary | ICD-10-CM | POA: Diagnosis present

## 2016-02-20 DIAGNOSIS — F1590 Other stimulant use, unspecified, uncomplicated: Secondary | ICD-10-CM | POA: Diagnosis present

## 2016-02-20 DIAGNOSIS — F1310 Sedative, hypnotic or anxiolytic abuse, uncomplicated: Secondary | ICD-10-CM | POA: Diagnosis present

## 2016-02-20 DIAGNOSIS — F19929 Other psychoactive substance use, unspecified with intoxication, unspecified: Secondary | ICD-10-CM | POA: Diagnosis present

## 2016-02-20 DIAGNOSIS — F17200 Nicotine dependence, unspecified, uncomplicated: Secondary | ICD-10-CM | POA: Diagnosis present

## 2016-02-20 DIAGNOSIS — F900 Attention-deficit hyperactivity disorder, predominantly inattentive type: Secondary | ICD-10-CM | POA: Diagnosis not present

## 2016-02-20 MED ORDER — VALACYCLOVIR HCL 500 MG PO TABS
500.0000 mg | ORAL_TABLET | Freq: Two times a day (BID) | ORAL | Status: AC
  Administered 2016-02-21 – 2016-02-23 (×5): 500 mg via ORAL
  Filled 2016-02-20 (×5): qty 1

## 2016-02-20 MED ORDER — VALACYCLOVIR HCL 500 MG PO TABS
500.0000 mg | ORAL_TABLET | Freq: Two times a day (BID) | ORAL | Status: DC
  Administered 2016-02-20: 500 mg via ORAL
  Filled 2016-02-20 (×2): qty 1

## 2016-02-20 MED ORDER — TRAZODONE HCL 50 MG PO TABS
50.0000 mg | ORAL_TABLET | Freq: Every day | ORAL | Status: DC
  Filled 2016-02-20 (×2): qty 1

## 2016-02-20 MED ORDER — HALOPERIDOL 5 MG PO TABS: 5.0000 mg | ORAL_TABLET | Freq: Two times a day (BID) | ORAL | Status: DC

## 2016-02-20 MED ORDER — IBUPROFEN 600 MG PO TABS: 600.0000 mg | ORAL_TABLET | Freq: Three times a day (TID) | ORAL | Status: DC | PRN

## 2016-02-20 MED ORDER — TRAZODONE HCL 50 MG PO TABS
50.0000 mg | ORAL_TABLET | Freq: Every evening | ORAL | Status: DC | PRN
  Administered 2016-02-20 – 2016-02-23 (×7): 50 mg via ORAL
  Filled 2016-02-20: qty 14
  Filled 2016-02-20: qty 1
  Filled 2016-02-20 (×2): qty 14
  Filled 2016-02-20 (×6): qty 1
  Filled 2016-02-20: qty 14
  Filled 2016-02-20 (×3): qty 1
  Filled 2016-02-20 (×2): qty 14
  Filled 2016-02-20: qty 1

## 2016-02-20 MED ORDER — MAGNESIUM HYDROXIDE 400 MG/5ML PO SUSP: 30.0000 mL | Freq: Every day | ORAL | Status: DC | PRN

## 2016-02-20 MED ORDER — HYDROXYZINE HCL 25 MG PO TABS
25.0000 mg | ORAL_TABLET | Freq: Four times a day (QID) | ORAL | Status: DC | PRN
  Administered 2016-02-20 – 2016-02-22 (×2): 25 mg via ORAL
  Filled 2016-02-20 (×2): qty 1
  Filled 2016-02-20: qty 10

## 2016-02-20 MED ORDER — ONDANSETRON HCL 4 MG PO TABS: 4.0000 mg | ORAL_TABLET | Freq: Three times a day (TID) | ORAL | Status: DC | PRN

## 2016-02-20 MED ORDER — ALUM & MAG HYDROXIDE-SIMETH 200-200-20 MG/5ML PO SUSP
30.0000 mL | ORAL | Status: DC | PRN
Start: 2016-02-20 — End: 2016-02-24

## 2016-02-20 MED ORDER — BENZTROPINE MESYLATE 1 MG PO TABS
1.0000 mg | ORAL_TABLET | Freq: Two times a day (BID) | ORAL | Status: DC
  Administered 2016-02-20 – 2016-02-24 (×8): 1 mg via ORAL
  Filled 2016-02-20 (×4): qty 1
  Filled 2016-02-20 (×2): qty 14
  Filled 2016-02-20: qty 1
  Filled 2016-02-20: qty 14
  Filled 2016-02-20 (×2): qty 1
  Filled 2016-02-20: qty 14
  Filled 2016-02-20 (×5): qty 1

## 2016-02-20 MED ORDER — ACETAMINOPHEN 325 MG PO TABS: 650.0000 mg | ORAL_TABLET | Freq: Four times a day (QID) | ORAL | Status: DC | PRN

## 2016-02-20 MED ORDER — NICOTINE 21 MG/24HR TD PT24
21.0000 mg | MEDICATED_PATCH | Freq: Every day | TRANSDERMAL | Status: DC
  Administered 2016-02-22 – 2016-02-24 (×3): 21 mg via TRANSDERMAL
  Filled 2016-02-20 (×7): qty 1

## 2016-02-20 MED ORDER — HALOPERIDOL 5 MG PO TABS
5.0000 mg | ORAL_TABLET | Freq: Two times a day (BID) | ORAL | Status: DC
  Administered 2016-02-20 – 2016-02-24 (×8): 5 mg via ORAL
  Filled 2016-02-20: qty 14
  Filled 2016-02-20 (×3): qty 1
  Filled 2016-02-20 (×2): qty 14
  Filled 2016-02-20 (×9): qty 1
  Filled 2016-02-20: qty 14
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
## 2016-02-20 NOTE — Progress Notes (Signed)
Entered in d/c instruction Please use the resources provided to you in emergency room by case manager to assist with doctor for follow up A referral for you has been sent to [facility] if you have not received a call in 3 days you may contact them Call Scherry RanKaren Andrianos at [phone] Tuesday-Friday [email or web address] These [place] uninsured resources provide possible primary care providers, resources for discounted medications, housing, dental resources, affordable care act information, plus other resources for [place] [place] [facility] [place]
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
## 2016-02-20 NOTE — ED Notes (Signed)
Pt is alert and oriented x4. Pt denies any form of depression, anxiety, pain, SI, HI or AVH; states, "I am fine. I will be going home tomorrow." Pt was calm and cooperative. Will continue to monitor for safety via security cameras and Q 15 minute checks.

## 2016-02-20 NOTE — Progress Notes (Signed)
CM spoke with pt who confirms uninsured [place] resident with no pcp.  CM discussed and provided written information for uninsured accepting pcps, discussed the importance of pcp vs EDP services for f/u care, www.needymeds.org, www.goodrx.com, discounted pharmacies and other [place] resources such as [facility] , [facility], affordable care act, financial assistance, uninsured dental services, [place] med assist, DSS and  health department  Reviewed resources for [place] uninsured accepting pcps like [facility], [facility] [address], [facility] [place], [facility], local urgent care centers, [facility], [facility], general medical clinics, [facility], [facility] plus others, medication resources, [facility] out patient pharmacies and housing Pt voiced understanding and appreciation of resources provided   Provided [facility] contact information Pt agreed to a referral Cm completed referral Pt to be contact by [facility] clinical liason  

## 2016-02-20 NOTE — ED Notes (Signed)
[facility]  [facility].  All belongings given to the transporters.

## 2016-02-20 NOTE — Tx Team (Addendum)
Initial Interdisciplinary Treatment Plan   PATIENT STRESSORS: Financial difficulties   PATIENT STRENGTHS: Wellsite geologistCommunication skills General fund of knowledge - 02/21/16 BC   PROBLEM LIST: Problem List/Patient Goals Date to be addressed Date deferred Reason deferred Estimated date of resolution  Psychosis 02/20/16     Self harm 02/20/16     "getting things taken care of and corrected" 02/20/16     "things to stay corrected with my family" 02/20/16                                    DISCHARGE CRITERIA:  Need for constant or close observation no longer present Verbal commitment to aftercare and medication compliance  PRELIMINARY DISCHARGE PLAN: Outpatient therapy Medication management  PATIENT/FAMIILY INVOLVEMENT: This treatment plan has been presented to and reviewed with the patient, Rexene EdisonJohn N Fraleigh.  The patient and family have been given the opportunity to ask questions and make suggestions.  Levin BaconHeather V Reddick 02/20/2016, 6:47 PM

## 2016-02-21 ENCOUNTER — Encounter (HOSPITAL_COMMUNITY): Payer: Self-pay | Admitting: Psychiatry

## 2016-02-21 DIAGNOSIS — F29 Unspecified psychosis not due to a substance or known physiological condition: Secondary | ICD-10-CM

## 2016-02-21 NOTE — Progress Notes (Signed)
D: Patient denies SI/HI or AVH.  Pt. Is pleasant and cooperative upon assessment.  He states that he slept well and his appetite is good.  He denies any physical complaints.  Pt. Attended morning group with the social worker.  A: Patient given emotional support from RN. Patient encouraged to come to staff with concerns and/or questions. Patient's medication routine continued. Patient's orders and plan of care reviewed.   R: Patient remains appropriate and cooperative. Will continue to monitor patient q15 minutes for safety.

## 2016-02-21 NOTE — Plan of Care (Signed)
Problem: Aggression Towards others,Towards Self, and or Destruction Goal: STG-Patient will comply with prescribed medication regimen (Patient will comply with prescribed medication regimen)  Outcome: Progressing Pt has been compliant with medications tonight.      
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
## 2016-02-21 NOTE — Progress Notes (Signed)
Adult Psychoeducational Group Note  Date:  02/21/2016 Time:  10:03 PM  Group Topic/Focus:  Wrap-Up Group:   The focus of this group is to help patients review their daily goal of treatment and discuss progress on daily workbooks.  Participation Level:  Active  Participation Quality:  Attentive   Affect:  Appropriate  Cognitive:  Appropriate  Insight: Good  Engagement in Group:  Engaged  Modes of Intervention:  Discussion  Additional Comments:  Pt had a great day and his goal for tomorrow is to have a better day.   Merlinda FrederickKeshia S Honest Safranek 02/21/2016, 10:03 PM
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
## 2016-02-21 NOTE — BHH Group Notes (Signed)
BHH LCSW Group Therapy Group Therapy:  [facility]  1:15 - 2: 30 PM   Date:  02/21/2016 Time:  2:14 PM   Type of Therapy: Group Therapy  Participation Level: Invited, chose not to attend   Santa GeneraAnne Cunningham, LCSW Lead Clinical Social Worker Phone:  [phone]
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
## 2016-02-21 NOTE — Progress Notes (Signed)
D: Pt presents with anxious affect and mood.  Pt was in hallway upon initial approach.  He reported he would like medication for anxiety.  Pt also reported that he may need more than one dose of Trazodone tonight.  Pt reports his goal is to "have a good night" and to "mingle."  Pt denies SI/HI, denies hallucinations, denies pain.  Pt has been visible in milieu interacting with peers and staff appropriately.  Pt attended evening group.   A: Introduced self to pt.  Met with pt and offered support and encouragement.  Medications administered per order.  On-site provider notified of pt's requests related to medication.  PRN medication for anxiety was ordered and administered.  Repeat dose of Trazodone was ordered.   R: Pt is compliant with medications.  Pt verbally contracts for safety.  Will continue to monitor and assess.

## 2016-02-21 NOTE — Progress Notes (Signed)
Adult Psychoeducational Group Note  Date:  02/21/2016 Time:  1:02 AM  Group Topic/Focus:  Wrap-Up Group:   The focus of this group is to help patients review their daily goal of treatment and discuss progress on daily workbooks.  Participation Level:  Active  Participation Quality:  Attentive  Affect:  Appropriate  Cognitive:  Appropriate  Insight: Good  Engagement in Group:  Engaged  Modes of Intervention:  Discussion  Additional Comments:  Pt mentioned he had a great day. Pt goal for tomorrow is to have a better day then today.   Merlinda FrederickKeshia S Sokha Craker 02/21/2016, 1:02 AM
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
## 2016-02-22 DIAGNOSIS — F411 Generalized anxiety disorder: Secondary | ICD-10-CM

## 2016-02-22 DIAGNOSIS — F900 Attention-deficit hyperactivity disorder, predominantly inattentive type: Secondary | ICD-10-CM

## 2016-02-22 DIAGNOSIS — F19929 Other psychoactive substance use, unspecified with intoxication, unspecified: Secondary | ICD-10-CM

## 2016-02-22 DIAGNOSIS — F1590 Other stimulant use, unspecified, uncomplicated: Secondary | ICD-10-CM

## 2016-02-22 DIAGNOSIS — F1310 Sedative, hypnotic or anxiolytic abuse, uncomplicated: Secondary | ICD-10-CM

## 2016-02-22 DIAGNOSIS — F1994 Other psychoactive substance use, unspecified with psychoactive substance-induced mood disorder: Principal | ICD-10-CM | POA: Diagnosis present

## 2016-02-22 MED ORDER — OLANZAPINE 10 MG IM SOLR: 5.0000 mg | Freq: Three times a day (TID) | INTRAMUSCULAR | Status: DC | PRN

## 2016-02-22 MED ORDER — OLANZAPINE 5 MG PO TBDP: 5.0000 mg | ORAL_TABLET | Freq: Three times a day (TID) | ORAL | Status: DC | PRN

## 2016-02-22 NOTE — [facility] Group Notes (Signed)
[facility] Group Notes:  (Nursing/MHT/Case Management/Adjunct)  Date:  02/22/2016  Time:  7:01 PM  Type of Therapy:  Nurse Education  Participation Level:  Active  Participation Quality:  Appropriate and Attentive  Affect:  Appropriate  Cognitive:  Alert and Appropriate  Insight:  Appropriate and Good  Engagement in Group:  Engaged and Improving  Modes of Intervention:  Activity, Discussion and Education    Summary of Progress/Problems: Topic was on leisure and lifestyle changes. Discussed the importance of choosing a healthy leisure activities. Group encouraged to surround themselves with positive and healthy group/support system when changing to a healthy lifestyle. Patient was receptive and contributed.    Mickie Baillizabeth O Iwenekha 02/22/2016, 7:01 PM

## 2016-02-22 NOTE — [facility] Group Notes (Signed)
[facility] LCSW Group Therapy  02/22/2016 1:15 pm  Type of Therapy: Process Group Therapy  Participation Level:  Active  Participation Quality:  Appropriate  Affect:  Flat  Cognitive:  Oriented  Insight:  Improving  Engagement in Group:  Limited  Engagement in Therapy:  Limited  Modes of Intervention:  Activity, Clarification, Education, Problem-solving and Support  Summary of Progress/Problems: Today's group addressed the issue of overcoming obstacles.  Patients were asked to identify their biggest obstacle post d/c that stands in the way of their on-going success, and then problem solve as to how to manage this. Stayed the whole time.  Engaged throughout.  "Obstacles a re a part of life. I just take them as they come.  When asked specifically about being here, stated that he has learned it is better to swim with the stream rather than against it."  Vague.  Would prefer to not give much information about himself and his situation.  Ida Rogueorth, Rien Marland B 02/22/2016   3:37 PM

## 2016-02-22 NOTE — Progress Notes (Signed)
DAR NOTE: Patient is calm and pleasant on approach.  Denies pain, auditory and visual hallucinations.  Rates depression at 0, hopelessness at 0, and anxiety at 0.  Describes energy level as normal and concentration as good.  Maintained on routine safety checks.  Medications given as prescribed.  Support and encouragement offered as needed.  Attended group and participated.  States goal for today is "have a good day."  Patient observed socializing with peers in the dayroom.  Offered no complaint.

## 2016-02-22 NOTE — Progress Notes (Signed)
Adult Psychoeducational Group Note  Date:  02/22/2016 Time:  9:50 PM  Group Topic/Focus:  Wrap-Up Group:   The focus of this group is to help patients review their daily goal of treatment and discuss progress on daily workbooks.  Participation Level:  Active  Participation Quality:  Appropriate  Affect:  Appropriate  Cognitive:  Appropriate  Insight: Good  Engagement in Group:  Engaged  Modes of Intervention:  Orientation  Additional Comments:  Patient rated her day a 10. Goal is to have a good night.  Brad Barr 02/22/2016, 9:50 PM
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
## 2016-02-22 NOTE — Progress Notes (Signed)
D: Pt has appropriate affect and anxious mood.  He reports his day was "good" and that his goal was to "have a good day."  Pt reports he attended groups today.  Pt denies SI/HI, denies hallucinations, denies pain.  Pt has been visible in milieu interacting with peers and staff appropriately.  Pt attended evening group.   A: Actively listened to pt and offered support and encouragement.  Medications administered per order.   R: Pt is compliant with medications.  Pt verbally contracts for safety.  Will continue to monitor and assess.

## 2016-02-23 MED ORDER — CITALOPRAM HYDROBROMIDE 10 MG PO TABS
10.0000 mg | ORAL_TABLET | Freq: Every day | ORAL | Status: DC
  Administered 2016-02-23 – 2016-02-24 (×2): 10 mg via ORAL
  Filled 2016-02-23: qty 1
  Filled 2016-02-23: qty 7
  Filled 2016-02-23 (×2): qty 1
  Filled 2016-02-23: qty 7
  Filled 2016-02-23: qty 1

## 2016-02-23 NOTE — Progress Notes (Signed)
[facility] MD Progress Note  02/23/2016 11:37 AM ICHAEL PULLARA  MRN:  [id number] Subjective:  Patient states " I still feel a bit anxious , but I am fine.'    Objective:[name] GROVER ROBINSON is a 40 y.o.caucasian male, who is self employed , lives by self in [place] , is single, who was brought in to [facility] petitioned by dad after he had tried to hurt self .   Patient seen and chart reviewed today.Discussed patient with treatment team.  Pt today is seen as alert, oriented , calm , has bruises to BL eyes.  Pt expresses anxiety sx, and reports he would like to be back on celexa. Pt has been tolerating his other medications well, denies any ADRS. Per staff - no disruptive issues noted on the unit.      Principal Problem: Substance or medication-induced bipolar and related disorder with onset during intoxication (HCC) ( stimulants,BZD)  Diagnosis:   Patient Active Problem List   Diagnosis Date Noted  . 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 Homicidal ideation [R45.850]   . 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 GAD (generalized anxiety disorder) [F41.1] 11/07/2015  . 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 Pt denies hx of suicide attempts  Past Medical History:  Past Medical History  Diagnosis Date  . Schizophrenia (HCC)     Pt requesting this be removed.   . ADHD (attention deficit hyperactivity disorder)    History reviewed. No pertinent past surgical history. Family History:  Family History  Problem Relation Age of Onset  . Mental illness Other    Family  Psychiatric  History: Pt reports " My whole family has mental illness.'Not able to elaborate further.But does state that his sister has hx of drug abuse. Social History: Pt is single , self employed, works for [employer] .  History  Alcohol Use  . Yes     History  Drug Use  . Yes  . Special: Amphetamines    Social History   Social History  . Marital Status: Single    Spouse Name: N/A  . Number of Children: N/A  . Years of Education: N/A   Social History Main Topics  . Smoking status: Current Some Day Smoker  . Smokeless tobacco: None  . Alcohol Use: Yes  . Drug Use: Yes    Special: Amphetamines  . Sexual Activity: Not Asked   Other Topics Concern  . None   Social History Narrative   Additional Social History:    Pain Medications: see mar Prescriptions: see mar Over the Counter: see mar History of alcohol / drug use?: No history of alcohol / drug abuse (He adamantly denies any SA) Longest period of sobriety (when/how long): denies current usage Withdrawal Symptoms: Other (Comment) (None)                    Sleep: Fair  Appetite:  Fair  Current Medications: Current Facility-Administered Medications  Medication Dose Route Frequency Provider Last Rate Last Dose  . acetaminophen (TYLENOL) tablet 650 mg  650 mg Oral Q6H PRN Earney Navy, NP      .  alum & mag hydroxide-simeth (MAALOX/MYLANTA) 200-200-20 MG/5ML suspension 30 mL  30 mL Oral Q4H PRN Earney NavyJosephine C Onuoha, NP      . benztropine (COGENTIN) tablet 1 mg  1 mg Oral BID Earney NavyJosephine C Onuoha, NP   1 mg at 02/23/16 0825  . haloperidol (HALDOL) tablet 5 mg  5 mg Oral BID Earney NavyJosephine C Onuoha, NP   5 mg at 02/23/16 0825  . hydrOXYzine (ATARAX/VISTARIL) tablet 25 mg  25 mg Oral Q6H PRN Kerry HoughSpencer E [name], PA-C   25 mg at 02/22/16 2059  . ibuprofen (ADVIL,MOTRIN) tablet 600 mg  600 mg Oral Q8H PRN Earney NavyJosephine C Onuoha, NP      . magnesium hydroxide (MILK OF MAGNESIA) suspension 30 mL  30 mL Oral Daily PRN Earney NavyJosephine C  Onuoha, NP      . nicotine (NICODERM CQ - dosed in mg/24 hours) patch 21 mg  21 mg Transdermal Daily Earney NavyJosephine C Onuoha, NP   21 mg at 02/23/16 0828  . OLANZapine zydis (ZYPREXA) disintegrating tablet 5 mg  5 mg Oral TID PRN Jomarie LongsSaramma Bernardino Dowell, MD       Or  . OLANZapine (ZYPREXA) injection 5 mg  5 mg Intramuscular TID PRN Jomarie LongsSaramma Nickcole Bralley, MD      . ondansetron (ZOFRAN) tablet 4 mg  4 mg Oral Q8H PRN Earney NavyJosephine C Onuoha, NP      . traZODone (DESYREL) tablet 50 mg  50 mg Oral QHS,MR X 1 Kerry HoughSpencer E [name], PA-C   50 mg at 02/22/16 2203    Lab Results: No results found for this or any previous visit (from the past 48 hour(s)).  Physical Findings: AIMS: Facial and Oral Movements Muscles of Facial Expression: None, normal Lips and Perioral Area: None, normal Jaw: None, normal Tongue: None, normal,Extremity Movements Upper (arms, wrists, hands, fingers): None, normal Lower (legs, knees, ankles, toes): None, normal, Trunk Movements Neck, shoulders, hips: None, normal, Overall Severity Severity of abnormal movements (highest score from questions above): None, normal Incapacitation due to abnormal movements: None, normal Patient's awareness of abnormal movements (rate only patient's report): No Awareness, Dental Status Current problems with teeth and/or dentures?: No Does patient usually wear dentures?: No  CIWA:    COWS:     Musculoskeletal: Strength & Muscle Tone: within normal limits Gait & Station: normal Patient leans: N/A  Psychiatric Specialty Exam: Review of Systems  Psychiatric/Behavioral: Positive for substance abuse. The patient is nervous/anxious.   All other systems reviewed and are negative.   Blood pressure 124/72, pulse 75, temperature 97.4 F (36.3 C), temperature source Oral, resp. rate 16, height 6\' 2"  (1.88 m), weight 98.431 kg (217 lb), SpO2 100 %.Body mass index is 27.85 kg/(m^2).  General Appearance: Fairly Groomed  Patent attorneyye Contact::  Fair  Speech:  Clear and Coherent   Volume:  Normal  Mood:  Anxious  Affect:  Appropriate  Thought Process:  Coherent  Orientation:  Full (Time, Place, and Person)  Thought Content:  Rumination  Suicidal Thoughts:  No  Homicidal Thoughts:  No  Memory:  Immediate;   Fair Recent;   Fair Remote;   Fair  Judgement:  Impaired  Insight:  Shallow  Psychomotor Activity:  Restlessness  Concentration:  Fair  Recall:  FiservFair  Fund of Knowledge:Fair  Language: Fair  Akathisia:  No  Handed:  Right  AIMS (if indicated):     Assets:  Desire for Improvement Social Support  ADL's:  Intact  Cognition: WNL  Sleep:  Number of Hours: 6.5  Treatment Plan Summary:Patient presented after petitioned by family for self injurious behavior. Pt presented with BL bruises to his eyes. Pt to be started on celexa for anxiety sx.  Will continue treatment.   Daily contact with patient to assess and evaluate symptoms and progress in treatment and Medication management  Will restart Celexa 10 mg po daily for affective sx. Will continue  Haldol 5 mg po bid for mood lability/psychosis. Will continue Cogentin 1 mg po qhs for EPS. Will continue Trazodone 50 mg po qhs for sleep. Will make available PRN medications as per agitation protocol. Will continue to monitor vitals ,medication compliance and treatment side effects while patient is here.  Will monitor for medical issues as well as call consult as needed.  Reviewed labs- HBA1C, LIPID PANEL, TSH ( 11/07/15) - WNL , PL -(11/07/15) - slightly elevated. Will repeat PL-,pending. CSW will continue working on disposition.  Patient to participate in therapeutic milieu .   Carder Yin MD 02/23/2016, 11:37 AM

## 2016-02-23 NOTE — Progress Notes (Signed)
Adult Psychoeducational Group Note  Date:  02/23/2016 Time:  9:28 PM  Group Topic/Focus:  Wrap-Up Group:   The focus of this group is to help patients review their daily goal of treatment and discuss progress on daily workbooks.  Participation Level:  Active  Participation Quality:  Appropriate  Affect:  Appropriate  Cognitive:  Appropriate  Insight: Good  Engagement in Group:  Engaged  Modes of Intervention:  Activity  Additional Comments:  Patient rated his day a 10. Goal is to have a good day.  Natasha MeadKiara M Shykeem Resurreccion 02/23/2016, 9:28 PM

## 2016-02-23 NOTE — Progress Notes (Signed)
D: Pt has anxious affect and anxious mood.  He continues to report his goal is to "have a good day" and that his day was "good."  Pt denies SI/HI, denies hallucinations, denies pain.  Pt has been visible in milieu interacting with peers and staff appropriately.  Pt attended evening group.   A: Actively listened to pt and offered support and encouragement.  Medications administered per order.  PRN medication administered for anxiety and sleep. R: Pt is compliant with medications.  Pt verbally contracts for safety.  Will continue to monitor and assess.

## 2016-02-23 NOTE — [facility] Group Notes (Signed)
[facility] LCSW Group Therapy  02/23/2016  1:05 PM  Type of Therapy:  Group therapy  Participation Level:  Active  Participation Quality:  Attentive  Affect:  Flat  Cognitive:  Oriented  Insight:  Limited  Engagement in Therapy:  Limited  Modes of Intervention:  Discussion, Socialization  Summary of Progress/Problems:  Chaplain was here to lead a group on themes of hope and courage. "I'm just taking in information.  I don't have much to say on the subject.  Sat quietly.  Stayed the entire time.  Appeared engaged, but a man of few words.  [name], Stanley Helmuth B 02/23/2016 1:43 PM

## 2016-02-23 NOTE — Progress Notes (Signed)
DAR NOTE: Patient is calm and pleasant. Denies pain, auditory and visual hallucinations.  Rates depression at 0, hopelessness at 0, and anxiety at 0.  Maintained on routine safety checks.  Medications given as prescribed.  Support and encouragement offered as needed.  Attended group and participated.  States goal for today is "having a good day."  Patient observed socializing with peers in the dayroom.  Offered no complaint.  Patient verbalizes readiness for discharge.

## 2016-02-23 NOTE — [facility] Suicide Risk Assessment (Signed)
[facility] INPATIENT:  Family/Significant Other Suicide Prevention Education  Suicide Prevention Education:  Patient Refusal for Family/Significant Other Suicide Prevention Education: The patient Brad Barr has refused to provide written consent for family/significant other to be provided Family/Significant Other Suicide Prevention Education during admission and/or prior to discharge.  Physician notified.  [name], Laruth Hanger B 02/23/2016, 3:52 PM

## 2016-02-23 NOTE — Plan of Care (Signed)
Problem: Alteration in thought process Goal: LTG-Patient has not harmed self or others in at least 2 days Outcome: Completed/Met Date Met:  02/23/16 Pt has not harmed self or others in the past 2 days.

## 2016-02-23 NOTE — Progress Notes (Signed)
  [facility] :  Will you be returning to the same living situation after discharge:  Yes,  home At discharge, do you have transportation home?: Yes,  bus pass Do you have the ability to pay for your medications: Yes,  mental health  Release of information consent forms completed and in the chart;  Patient's signature needed at discharge.  Patient to Follow up at: Follow-up Information    [facility]   Specialty:  Professional Counselor   Why:  Go to the walk-in clinic M-F between 8:30 and 11 for your hospital follow up appointment   Contact information:   [facility] [address] [place] [address] [phone]       Next level of care provider has [facility] to [facility] Link:no  Safety Planning and Suicide Prevention discussed: Yes,  yes  Have you used any form of tobacco in the last 30 days? (Cigarettes, Smokeless Tobacco, Cigars, and/or Pipes): Yes  Has patient been referred to the Quitline?: Yes, faxed on 02/23/16  Patient has been referred for addiction treatment: Yes  Ida Rogueorth, Valorie Mcgrory B 02/23/2016, 2:20 PM

## 2016-02-24 LAB — PROLACTIN: PROLACTIN: 79.3 ng/mL — AB (ref 4.0–15.2)

## 2016-02-24 MED ORDER — NICOTINE 21 MG/24HR TD PT24: 21.0000 mg | MEDICATED_PATCH | Freq: Every day | TRANSDERMAL | Status: DC

## 2016-02-24 MED ORDER — HYDROXYZINE HCL 25 MG PO TABS: 25.0000 mg | ORAL_TABLET | Freq: Four times a day (QID) | ORAL | Status: DC | PRN

## 2016-02-24 MED ORDER — HALOPERIDOL 5 MG PO TABS: 5.0000 mg | ORAL_TABLET | Freq: Two times a day (BID) | ORAL | Status: DC

## 2016-02-24 MED ORDER — BENZTROPINE MESYLATE 1 MG PO TABS: 1.0000 mg | ORAL_TABLET | Freq: Two times a day (BID) | ORAL | Status: DC

## 2016-02-24 MED ORDER — CITALOPRAM HYDROBROMIDE 10 MG PO TABS: 10.0000 mg | ORAL_TABLET | Freq: Every day | ORAL | Status: DC

## 2016-02-24 MED ORDER — TRAZODONE HCL 50 MG PO TABS: 50.0000 mg | ORAL_TABLET | Freq: Every evening | ORAL | Status: DC | PRN

## 2016-02-24 NOTE — [facility] Group Notes (Signed)
[facility] Group Notes:  (Clinical Social Work)  02/24/2016  11:15-12:00PM  Summary of Progress/Problems:   Today's process group involved patients discussing their feelings related to being hospitalized, as well as how they can use their present feelings to create a plan for how to stay well and out of the hospital. The patient expressed his primary feeling about being hospitalized is "rewarding" and seemed excited that he is leaving today at 1pm.  He listened attentively and was willing to be used as an example to get everyone to think about the importance of adhering to a follow-up plan to stay on medications and thus out of the hospital.    Type of Therapy:  Group Therapy - Process  Participation Level:  Active  Participation Quality:  Attentive  Affect:  Blunted  Cognitive:  Appropriate  Insight:  Engaged  Engagement in Therapy:  Engaged  Modes of Intervention:  Exploration, Discussion  Ambrose MantleMareida Grossman-Orr, LCSW 02/24/2016, 12:21 PM

## 2016-02-24 NOTE — [facility] Suicide Risk Assessment (Signed)
[facility]   Principal Problem: Substance or medication-induced bipolar and related disorder with onset during intoxication [facility](HCC) Discharge Diagnoses:  Patient Active Problem List   Diagnosis Date Noted  . 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 Homicidal ideation [R45.850]   . 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 GAD (generalized anxiety disorder) [F41.1] 11/07/2015  . 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   HENT: Negative.   Eyes: Negative.   Respiratory: Negative.   Cardiovascular: Negative.   Gastrointestinal: Negative.   Genitourinary: Negative.   Musculoskeletal: Negative.   Skin: Negative.   Neurological: Negative.   Endo/Heme/Allergies: Negative.   Psychiatric/Behavioral: Positive for substance abuse.    Blood pressure 116/69, pulse 78, temperature 97.7 F (36.5 C), temperature source Oral, resp. rate 16, height 6\' 2"  (1.88 m), weight 98.431 kg (217 lb), SpO2 100 %.Body mass index is 27.85 kg/(m^2).  General Appearance: Fairly Groomed and bruises under both eyes  Eye Contact::  Fair  Speech:  Clear and Coherent409  Volume:  Normal  Mood:  Euthymic  Affect:  Appropriate  Thought Process:  Coherent and Goal Directed  Orientation:  Full (Time, Place, and Person)  Thought Content:  plans as he moves on  Suicidal Thoughts:  No  Homicidal Thoughts:  No  Memory:  Immediate;   Fair Recent;   Fair Remote;   Fair  Judgement:  Fair   Insight:  Present  Psychomotor Activity:  Normal  Concentration:  Fair  Recall:  FiservFair  Fund of Knowledge:Fair  Language: Fair  Akathisia:  No  Handed:  Right  AIMS (if indicated):     Assets:  Desire for Improvement Housing Social Support Transportation  Sleep:  Number of Hours: 6.75  Cognition: WNL  ADL's:  Intact  In full contact with reality. There are no active SI plans or intent. He is willing and motivated to pursue outpatient treatment Mental Status Per Nursing Assessment::   On Admission:     Demographic Factors:  Male and Caucasian  Loss Factors: none identified  Historical Factors: Family history of mental illness or substance abuse  Risk Reduction Factors:   Sense of responsibility to family and Positive social support  Continued Clinical Symptoms:  Alcohol/Substance Abuse/Dependencies  Cognitive Features That Contribute To Risk:  None    Suicide Risk:  Minimal: No identifiable suicidal ideation.  Patients presenting with no risk factors but with morbid ruminations; may be classified as minimal risk based on the severity of the depressive symptoms  Follow-up Information    [facility]   Specialty:  Professional Counselor   Why:  Go to the walk-in clinic M-F between 8:30 and 11 for your hospital follow up appointment   Contact information:   [facility] [address] [place] [address] [phone]       Plan Of Care/Follow-up recommendations:  Activity:  as tolerated Diet:  regular Follow up as above Tiphani Mells A, MD 02/24/2016, 11:09 AM

## 2016-02-24 NOTE — Progress Notes (Signed)
D. Pt had been up and visible in milieu this evening, attended and participated in evening group activity. Pt inquired on multiple occasions about discharge for the morning and spoke about how he was told that he would be able to discharge in the morning. Pt was explained the discharge process and was explained the decision would be made in the morning and pt was agreeable with that. Pt also received medications without incident. A. Support provided. R. Safety maintained, will continue to monitor.

## 2016-02-24 NOTE — Progress Notes (Signed)
Patient  discharged home with samples and prescriptions. Patient was stable and appreciative at that time. All papers and prescriptions were given and valuables returned. Verbal understanding expressed. Denies SI/HI and A/VH. Patient given opportunity to express concerns and ask questions.  
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
## 2016-04-09 ENCOUNTER — Ambulatory Visit (HOSPITAL_COMMUNITY)
Admission: RE | Admit: 2016-04-09 | Payer: Federal, State, Local not specified - Other | Source: Home / Self Care | Admitting: Psychiatry

## 2016-04-30 ENCOUNTER — Ambulatory Visit (INDEPENDENT_AMBULATORY_CARE_PROVIDER_SITE_OTHER): Payer: No Typology Code available for payment source | Admitting: Psychiatry

## 2016-04-30 ENCOUNTER — Encounter (HOSPITAL_COMMUNITY): Payer: Self-pay | Admitting: Psychiatry

## 2016-04-30 VITALS — BP 136/80 | HR 58 | Ht 74.0 in | Wt 224.2 lb

## 2016-04-30 DIAGNOSIS — F1994 Other psychoactive substance use, unspecified with psychoactive substance-induced mood disorder: Secondary | ICD-10-CM

## 2016-04-30 NOTE — Progress Notes (Signed)
[facility] [phone] Progress Note  Brad Barr [id number] 40 y.o.  04/30/2016 11:31 AM  Chief Complaint:  I have focus problem.  I need Adderall.  I have anxiety and I need Xanax.  History of Present Illness:  Brad Barr is 40 year old Caucasian unemployed man who came for his initial appointment.  Patient is somewhat irritable, demanding and requesting that he need something to help his focus and panic attacks.  He was discharged from behavioral [place] on 02/24/2016 follow-up at [facility] [place] but he decided to see a new psychiatrist.  He has seen Dr. car in the past but reported he cannot afford going there.  Patient was admitted to behavioral [place] because of paranoia and agitation.  He was admitted involuntarily because he was threatening to kill his family and himself.  He was diagnosed with substance-induced mood disorder and bipolar disorder.  He was discharged on Haldol Celexa and Vistaril.  However he has been not taking psychiatric medication because he believe he does not have any mood disorder.  Patient was uncooperative to provide any information.  He minimizes his hospital stay and reason for his admission.  He mention it is a long story and he has no time to discuss.  He came with his mother but did not allow his mother to come in for more information.  He demanded that he need ADHD medicine on Adderall worse for him.  In the past he had taken amphetamine as UDS is positive.  Patient clearly denies any suicidal thoughts or homicidal thought.  He denies any paranoia, hallucination or any anger issues.  However patient does appear very irritable, paranoid, guarded and refused to cooperate for more information and did not believe the information that was present during his hospital stay.  When he was refused to provide any benzodiazepine, patient walked out stating that he does not want to waste my time and his time.  Suicidal Ideation: No Plan Formed: No  Patient has means to carry out plan: No  Homicidal Ideation: No Plan Formed: No Patient has means to carry out plan: No  Medical History; Patient did not provide medical information.  Family History; As per chart family history of schizophrenia but patient did not provide any information.  Education and Work History; Patient told he never finished his high school.  Currently he is unemployed.  He used to work as a Doctor, general practice.  Psychosocial History; Patient reported he lives by himself.  He did not provide more information.  Substance Abuse History; Patient denies any substance use however his UDS is positive for amphetamines.  Patient did not provide more information.  Past Psychiatric History/Hospitalization(s) Patient was discharged from behavioral [place] in March 2017.  He was admitted under involuntary commitment because he was threatening to his family members.  He was discharged on Haldol and Celexa.  Patient is noncompliant with medication.  Patient did not provide any more information and remains very guarded about his past history.   Outpatient Encounter Prescriptions as of 04/30/2016  Medication Sig  . benztropine (COGENTIN) 1 MG tablet Take 1 tablet (1 mg total) by mouth 2 (two) times daily.  . citalopram (CELEXA) 10 MG tablet Take 1 tablet (10 mg total) by mouth daily.  . haloperidol (HALDOL) 5 MG tablet Take 1 tablet (5 mg total) by mouth 2 (two) times daily.  . hydrOXYzine (ATARAX/VISTARIL) 25 MG tablet Take 1 tablet (25 mg total) by mouth every 6 (six) hours as needed for  anxiety.  . nicotine (NICODERM CQ - DOSED IN MG/24 HOURS) 21 mg/24hr patch Place 1 patch (21 mg total) onto the skin daily.  . traZODone (DESYREL) 50 MG tablet Take 1 tablet (50 mg total) by mouth at bedtime and may repeat dose one time if needed.   No facility-administered encounter medications on file as of 04/30/2016.    Recent Results (from the past 2160 hour(s))  Urine rapid drug screen (hosp  performed) (Not at [facility])     Status: None   Collection Time: 02/16/16 11:51 PM  Result Value Ref Range   Opiates NONE DETECTED NONE DETECTED   Cocaine NONE DETECTED NONE DETECTED   Benzodiazepines NONE DETECTED NONE DETECTED   Amphetamines NONE DETECTED NONE DETECTED   Tetrahydrocannabinol NONE DETECTED NONE DETECTED   Barbiturates NONE DETECTED NONE DETECTED    Comment:        DRUG SCREEN FOR MEDICAL PURPOSES ONLY.  IF CONFIRMATION IS NEEDED FOR ANY PURPOSE, NOTIFY LAB WITHIN 5 DAYS.        LOWEST DETECTABLE LIMITS FOR URINE DRUG SCREEN Drug Class       Cutoff (ng/mL) Amphetamine      1000 Barbiturate      200 Benzodiazepine   242 Tricyclics       683 Opiates          300 Cocaine          300 THC              50   Comprehensive metabolic panel     Status: None   Collection Time: 02/16/16 11:52 PM  Result Value Ref Range   Sodium 139 135 - 145 mmol/L   Potassium 3.9 3.5 - 5.1 mmol/L   Chloride 108 101 - 111 mmol/L   CO2 23 22 - 32 mmol/L   Glucose, Bld 97 65 - 99 mg/dL   BUN 9 6 - 20 mg/dL   Creatinine, Ser 0.74 0.61 - 1.24 mg/dL   Calcium 9.1 8.9 - 10.3 mg/dL   Total Protein 7.2 6.5 - 8.1 g/dL   Albumin 4.4 3.5 - 5.0 g/dL   AST 25 15 - 41 U/L   ALT 20 17 - 63 U/L   Alkaline Phosphatase 85 38 - 126 U/L   Total Bilirubin 0.6 0.3 - 1.2 mg/dL   GFR calc non Af Amer >60 >60 mL/min   GFR calc Af Amer >60 >60 mL/min    Comment: (NOTE) The eGFR has been calculated using the CKD EPI equation. This calculation has not been validated in all clinical situations. eGFR's persistently <60 mL/min signify possible Chronic Kidney Disease.    Anion gap 8 5 - 15  Ethanol (ETOH)     Status: None   Collection Time: 02/16/16 11:52 PM  Result Value Ref Range   Alcohol, Ethyl (B) <5 <5 mg/dL    Comment:        LOWEST DETECTABLE LIMIT FOR SERUM ALCOHOL IS 5 mg/dL FOR MEDICAL PURPOSES ONLY   Salicylate level     Status: None   Collection Time: 02/16/16 11:52 PM  Result Value Ref  Range   Salicylate Lvl 6.1 2.8 - 30.0 mg/dL  Acetaminophen level     Status: Abnormal   Collection Time: 02/16/16 11:52 PM  Result Value Ref Range   Acetaminophen (Tylenol), Serum <10 (L) 10 - 30 ug/mL    Comment:        THERAPEUTIC CONCENTRATIONS VARY SIGNIFICANTLY. A RANGE OF 10-30 ug/mL MAY BE AN  EFFECTIVE CONCENTRATION FOR MANY PATIENTS. HOWEVER, SOME ARE BEST TREATED AT CONCENTRATIONS OUTSIDE THIS RANGE. ACETAMINOPHEN CONCENTRATIONS >150 ug/mL AT 4 HOURS AFTER INGESTION AND >50 ug/mL AT 12 HOURS AFTER INGESTION ARE OFTEN ASSOCIATED WITH TOXIC REACTIONS.   CBC     Status: None   Collection Time: 02/16/16 11:52 PM  Result Value Ref Range   WBC 9.9 4.0 - 10.5 K/uL   RBC 5.00 4.22 - 5.81 MIL/uL   Hemoglobin 14.1 13.0 - 17.0 g/dL   HCT 39.8 39.0 - 52.0 %   MCV 79.6 78.0 - 100.0 fL   MCH 28.2 26.0 - 34.0 pg   MCHC 35.4 30.0 - 36.0 g/dL   RDW 13.1 11.5 - 15.5 %   Platelets 195 150 - 400 K/uL  Prolactin     Status: Abnormal   Collection Time: 02/23/16  6:30 AM  Result Value Ref Range   Prolactin 79.3 (H) 4.0 - 15.2 ng/mL    Comment: (NOTE) Performed At: [facility] [place], [place] [id number] Lindon Romp MD NT:[id number] Performed at [facility]     Physical Exam: Consitutional ;  BP 136/80 mmHg  Pulse 58  Ht 6' 2"  (1.88 m)  Wt 224 lb 3.2 oz (101.696 kg)  BMI 28.77 kg/m2  Musculoskeletal: Strength & Muscle Tone: within normal limits Gait & Station: normal Patient leans: N/A   Psychiatric Specialty Exam: General Appearance: Guarded and Uncooperative, irritable,  Eye Contact::  Fair  Speech:  Normal Rate  Volume:  Normal  Mood:  Irritable  Affect:  Labile  Thought Process:  Linear  Orientation:  Full (Time, Place, and Person)  Thought Content:  Rumination and Guarded  Suicidal Thoughts:  No  Homicidal Thoughts:  No  Memory:  Immediate;   Fair Recent;   Fair Remote;   Fair  Judgement:  Fair  Insight:   Lacking  Psychomotor Activity:  Increased  Concentration:  Fair  Recall:  [employer] of Knowledge:  Fair  Language:  Good  Akathisia:  No  Handed:  Right  AIMS (if indicated):     Assets:  Housing Social Support  ADL's:  Intact  Cognition:  WNL  Sleep:       Assessment: Mood disorder NOS.  Rule out bipolar disorder.  Rule out schizophrenia paranoid type.  Axis III:  Past Medical History  Diagnosis Date  . Schizophrenia ([place])     Pt requesting this be removed.   . ADHD (attention deficit hyperactivity disorder)     Plan:  Patient is very guarded, irritable and did not provide much information.  He insists that he need Adderall and Xanax to help his ADD and anxiety symptoms.  He has been not taking his Haldol since he left the hospital.  Patient walked out from the room when he was told that benzodiazepine or stimulant cannot be given.  He was provided other names but patient left the room with his mother.  I have informed that anytime having active suicidal thoughts or any thoughts of hurting someone then he should call 911 or go to local emergency room.  We will not schedule any more appointments in the future.  Jetaun Colbath T., MD 04/30/2016

## 2016-06-20 ENCOUNTER — Emergency Department (HOSPITAL_COMMUNITY): Payer: Self-pay

## 2016-06-20 ENCOUNTER — Encounter (HOSPITAL_COMMUNITY): Payer: Self-pay | Admitting: Emergency Medicine

## 2016-06-20 ENCOUNTER — Emergency Department (HOSPITAL_COMMUNITY)
Admission: EM | Admit: 2016-06-20 | Discharge: 2016-06-20 | Disposition: A | Discharge status: Home / Self Care | Payer: Self-pay | Attending: Emergency Medicine | Admitting: Emergency Medicine

## 2016-06-20 DIAGNOSIS — R519 Headache, unspecified: Secondary | ICD-10-CM

## 2016-06-20 DIAGNOSIS — R51 Headache: Secondary | ICD-10-CM | POA: Insufficient documentation

## 2016-06-20 DIAGNOSIS — F17200 Nicotine dependence, unspecified, uncomplicated: Secondary | ICD-10-CM | POA: Insufficient documentation

## 2016-06-20 LAB — BASIC METABOLIC PANEL
Anion gap: 8 (ref 5–15)
BUN: 17 mg/dL (ref 6–20)
CALCIUM: 9.2 mg/dL (ref 8.9–10.3)
CO2: 23 mmol/L (ref 22–32)
CREATININE: 0.71 mg/dL (ref 0.61–1.24)
Chloride: 104 mmol/L (ref 101–111)
GFR calc non Af Amer: >60 mL/min (ref >60)
Glucose, Bld: 95 mg/dL (ref 65–99)
Potassium: 3.7 mmol/L (ref 3.5–5.1)
SODIUM: 135 mmol/L (ref 135–145)

## 2016-06-20 LAB — CBC
HCT: 42.1 % (ref 39.0–52.0)
Hemoglobin: 14.7 g/dL (ref 13.0–17.0)
MCH: 28.2 pg (ref 26.0–34.0)
MCHC: 34.9 g/dL (ref 30.0–36.0)
MCV: 80.8 fL (ref 78.0–100.0)
PLATELETS: 201 10*3/uL (ref 150–400)
RBC: 5.21 MIL/uL (ref 4.22–5.81)
RDW: 13.4 % (ref 11.5–15.5)
WBC: 8.6 10*3/uL (ref 4.0–10.5)

## 2016-06-20 MED ORDER — SODIUM CHLORIDE 0.9 % IV BOLUS (SEPSIS)
1000.0000 mL | Freq: Once | INTRAVENOUS | Status: AC
  Administered 2016-06-20: 1000 mL via INTRAVENOUS

## 2016-06-20 MED ORDER — DIPHENHYDRAMINE HCL 50 MG/ML IJ SOLN
25.0000 mg | Freq: Once | INTRAMUSCULAR | Status: AC
  Administered 2016-06-20: 25 mg via INTRAVENOUS
  Filled 2016-06-20: qty 1

## 2016-06-20 MED ORDER — METOCLOPRAMIDE HCL 5 MG/ML IJ SOLN
10.0000 mg | Freq: Once | INTRAMUSCULAR | Status: AC
  Administered 2016-06-20: 10 mg via INTRAVENOUS
  Filled 2016-06-20: qty 2

## 2016-06-20 MED ORDER — KETOROLAC TROMETHAMINE 30 MG/ML IJ SOLN
30.0000 mg | Freq: Once | INTRAMUSCULAR | Status: AC
  Administered 2016-06-20: 30 mg via INTRAVENOUS
  Filled 2016-06-20: qty 1

## 2016-06-20 NOTE — Progress Notes (Signed)
EDCM spoke to patient at bedside. Patient confirms he does not have a pcp or insurance living in [place].  EDCM provided patient with pamphlet to [facility], informed patient of services there and walk in times.  EDCM also provided patient with list of pcps who accept self pay patients, list of discount pharmacies and websites needymeds.org and GoodRX.com for medication assistance, phone number to inquire about the orange card, phone number to inquire about Medicaid, phone number to inquire about the Affordable Care Act, financial resources in the community such as local churches, salvation army, urban ministries, and dental assistance for uninsured patients.  Patient thankful for resources.  No further EDCM needs at this time. 

## 2016-06-20 NOTE — Discharge Instructions (Signed)
Mr. Brad Barr,  Nice meeting you! Please follow-up with neurology. Return to the emergency department if you develop increased headaches, new/worsening symptoms. Feel better soon! General Headache Without Cause A headache is pain or discomfort felt around the head or neck area. There are many causes and types of headaches. In some cases, the cause may not be found.  HOME CARE  Managing Pain  Take over-the-counter and prescription medicines only as told by your doctor.  Lie down in a dark, quiet room when you have a headache.  If directed, apply ice to the head and neck area:  Put ice in a plastic bag.  Place a towel between your skin and the bag.  Leave the ice on for 20 minutes, 2-3 times per day.  Use a heating pad or hot shower to apply heat to the head and neck area as told by your doctor.  Keep lights dim if bright lights bother you or make your headaches worse. Eating and Drinking  Eat meals on a regular schedule.  Lessen how much alcohol you drink.  Lessen how much caffeine you drink, or stop drinking caffeine. General Instructions  Keep all follow-up visits as told by your doctor. This is important.  Keep a journal to find out if certain things bring on headaches. For example, write down:  What you eat and drink.  How much sleep you get.  Any change to your diet or medicines.  Relax by getting a massage or doing other relaxing activities.  Lessen stress.  Sit up straight. Do not tighten (tense) your muscles.  Do not use tobacco products. This includes cigarettes, chewing tobacco, or e-cigarettes. If you need help quitting, ask your doctor.  Exercise regularly as told by your doctor.  Get enough sleep. This often means 7-9 hours of sleep. GET HELP IF:  Your symptoms are not helped by medicine.  You have a headache that feels different than the other headaches.  You feel sick to your stomach (nauseous) or you throw up (vomit).  You have a  fever. GET HELP RIGHT AWAY IF:   Your headache becomes really bad.  You keep throwing up.  You have a stiff neck.  You have trouble seeing.  You have trouble speaking.  You have pain in the eye or ear.  Your muscles are weak or you lose muscle control.  You lose your balance or have trouble walking.  You feel like you will pass out (faint) or you pass out.  You have confusion.   This information is not intended to replace advice given to you by your health care provider. Make sure you discuss any questions you have with your health care provider.   Document Released: 09/03/2008 Document Revised: 08/16/2015 Document Reviewed: 03/20/2015 Elsevier Interactive Patient Education [employer]2016 Elsevier Inc.

## 2016-06-20 NOTE — ED Notes (Signed)
Patient presents for migraine, dizziness, muscle spasms x6 months. Denies N/V, fever. Patient A&O x4, ambulatory.

## 2016-07-02 ENCOUNTER — Inpatient Hospital Stay (HOSPITAL_COMMUNITY)
Admission: AD | Admit: 2016-07-02 | Discharge: 2016-07-05 | DRG: 897 | Disposition: A | Discharge status: Home / Self Care | Payer: Federal, State, Local not specified - Other | Attending: Psychiatry | Admitting: Psychiatry

## 2016-07-02 ENCOUNTER — Emergency Department (HOSPITAL_COMMUNITY)
Admission: EM | Admit: 2016-07-02 | Discharge: 2016-07-02 | Disposition: A | Discharge status: Other Acute Inpatient Hospital | Payer: Self-pay | Attending: Emergency Medicine | Admitting: Emergency Medicine

## 2016-07-02 ENCOUNTER — Encounter (HOSPITAL_COMMUNITY): Payer: Self-pay | Admitting: *Deleted

## 2016-07-02 ENCOUNTER — Encounter (HOSPITAL_COMMUNITY): Payer: Self-pay

## 2016-07-02 DIAGNOSIS — Z818 Family history of other mental and behavioral disorders: Secondary | ICD-10-CM | POA: Diagnosis not present

## 2016-07-02 DIAGNOSIS — Z5181 Encounter for therapeutic drug level monitoring: Secondary | ICD-10-CM | POA: Insufficient documentation

## 2016-07-02 DIAGNOSIS — F909 Attention-deficit hyperactivity disorder, unspecified type: Secondary | ICD-10-CM | POA: Insufficient documentation

## 2016-07-02 DIAGNOSIS — F200 Paranoid schizophrenia: Secondary | ICD-10-CM | POA: Diagnosis present

## 2016-07-02 DIAGNOSIS — F19950 Other psychoactive substance use, unspecified with psychoactive substance-induced psychotic disorder with delusions: Secondary | ICD-10-CM | POA: Diagnosis present

## 2016-07-02 DIAGNOSIS — R45851 Suicidal ideations: Secondary | ICD-10-CM | POA: Diagnosis present

## 2016-07-02 DIAGNOSIS — F3289 Other specified depressive episodes: Secondary | ICD-10-CM

## 2016-07-02 DIAGNOSIS — F17200 Nicotine dependence, unspecified, uncomplicated: Secondary | ICD-10-CM | POA: Diagnosis present

## 2016-07-02 DIAGNOSIS — Z9119 Patient's noncompliance with other medical treatment and regimen: Secondary | ICD-10-CM

## 2016-07-02 DIAGNOSIS — F329 Major depressive disorder, single episode, unspecified: Secondary | ICD-10-CM | POA: Diagnosis present

## 2016-07-02 DIAGNOSIS — F1994 Other psychoactive substance use, unspecified with psychoactive substance-induced mood disorder: Secondary | ICD-10-CM | POA: Diagnosis present

## 2016-07-02 DIAGNOSIS — F1310 Sedative, hypnotic or anxiolytic abuse, uncomplicated: Secondary | ICD-10-CM | POA: Diagnosis present

## 2016-07-02 DIAGNOSIS — F411 Generalized anxiety disorder: Secondary | ICD-10-CM | POA: Diagnosis present

## 2016-07-02 DIAGNOSIS — F209 Schizophrenia, unspecified: Secondary | ICD-10-CM | POA: Insufficient documentation

## 2016-07-02 DIAGNOSIS — F19929 Other psychoactive substance use, unspecified with intoxication, unspecified: Secondary | ICD-10-CM

## 2016-07-02 DIAGNOSIS — F900 Attention-deficit hyperactivity disorder, predominantly inattentive type: Secondary | ICD-10-CM | POA: Diagnosis present

## 2016-07-02 DIAGNOSIS — Z046 Encounter for general psychiatric examination, requested by authority: Secondary | ICD-10-CM

## 2016-07-02 DIAGNOSIS — F1590 Other stimulant use, unspecified, uncomplicated: Secondary | ICD-10-CM | POA: Diagnosis present

## 2016-07-02 DIAGNOSIS — R258 Other abnormal involuntary movements: Secondary | ICD-10-CM | POA: Insufficient documentation

## 2016-07-02 LAB — CBC
HEMATOCRIT: 40 % (ref 39.0–52.0)
Hemoglobin: 13.6 g/dL (ref 13.0–17.0)
MCH: 27.9 pg (ref 26.0–34.0)
MCHC: 34 g/dL (ref 30.0–36.0)
MCV: 82 fL (ref 78.0–100.0)
Platelets: 217 10*3/uL (ref 150–400)
RBC: 4.88 MIL/uL (ref 4.22–5.81)
RDW: 13.3 % (ref 11.5–15.5)
WBC: 7.7 10*3/uL (ref 4.0–10.5)

## 2016-07-02 LAB — COMPREHENSIVE METABOLIC PANEL
ALK PHOS: 65 U/L (ref 38–126)
ALT: 32 U/L (ref 17–63)
ANION GAP: 6 (ref 5–15)
AST: 23 U/L (ref 15–41)
Albumin: 4.2 g/dL (ref 3.5–5.0)
BILIRUBIN TOTAL: 0.7 mg/dL (ref 0.3–1.2)
BUN: 12 mg/dL (ref 6–20)
CALCIUM: 8.8 mg/dL — AB (ref 8.9–10.3)
CO2: 25 mmol/L (ref 22–32)
CREATININE: 0.75 mg/dL (ref 0.61–1.24)
Chloride: 105 mmol/L (ref 101–111)
GFR calc non Af Amer: >60 mL/min (ref >60)
GLUCOSE: 93 mg/dL (ref 65–99)
Potassium: 3.9 mmol/L (ref 3.5–5.1)
Sodium: 136 mmol/L (ref 135–145)
TOTAL PROTEIN: 7.6 g/dL (ref 6.5–8.1)

## 2016-07-02 LAB — ETHANOL: Alcohol, Ethyl (B): <5 mg/dL (ref <5)

## 2016-07-02 LAB — RAPID URINE DRUG SCREEN, HOSP PERFORMED
Amphetamines: NOT DETECTED
BARBITURATES: NOT DETECTED
Benzodiazepines: NOT DETECTED
COCAINE: NOT DETECTED
Opiates: NOT DETECTED
Tetrahydrocannabinol: NOT DETECTED

## 2016-07-02 LAB — SALICYLATE LEVEL

## 2016-07-02 LAB — ACETAMINOPHEN LEVEL

## 2016-07-02 MED ORDER — ALUM & MAG HYDROXIDE-SIMETH 200-200-20 MG/5ML PO SUSP: 30.0000 mL | ORAL | Status: DC | PRN

## 2016-07-02 MED ORDER — HALOPERIDOL 5 MG PO TABS
5.0000 mg | ORAL_TABLET | Freq: Two times a day (BID) | ORAL | Status: DC
  Administered 2016-07-02 – 2016-07-05 (×6): 5 mg via ORAL
  Filled 2016-07-02 (×2): qty 1
  Filled 2016-07-02: qty 14
  Filled 2016-07-02: qty 1
  Filled 2016-07-02: qty 14
  Filled 2016-07-02 (×6): qty 1

## 2016-07-02 MED ORDER — CITALOPRAM HYDROBROMIDE 10 MG PO TABS
10.0000 mg | ORAL_TABLET | Freq: Every day | ORAL | Status: DC
  Administered 2016-07-02: 10 mg via ORAL
  Filled 2016-07-02: qty 1

## 2016-07-02 MED ORDER — TRAZODONE HCL 50 MG PO TABS: 50.0000 mg | ORAL_TABLET | Freq: Every evening | ORAL | Status: DC | PRN

## 2016-07-02 MED ORDER — BENZTROPINE MESYLATE 1 MG PO TABS
1.0000 mg | ORAL_TABLET | Freq: Two times a day (BID) | ORAL | Status: DC
  Administered 2016-07-02: 1 mg via ORAL
  Filled 2016-07-02: qty 1

## 2016-07-02 MED ORDER — OLANZAPINE 10 MG PO TBDP: 10.0000 mg | ORAL_TABLET | Freq: Three times a day (TID) | ORAL | Status: DC | PRN

## 2016-07-02 MED ORDER — TRAZODONE HCL 50 MG PO TABS
50.0000 mg | ORAL_TABLET | Freq: Every evening | ORAL | Status: DC | PRN
  Administered 2016-07-03 – 2016-07-04 (×2): 50 mg via ORAL
  Filled 2016-07-02 (×10): qty 1

## 2016-07-02 MED ORDER — HALOPERIDOL 5 MG PO TABS
5.0000 mg | ORAL_TABLET | Freq: Two times a day (BID) | ORAL | Status: DC
  Administered 2016-07-02: 5 mg via ORAL
  Filled 2016-07-02: qty 1

## 2016-07-02 MED ORDER — ACETAMINOPHEN 325 MG PO TABS
650.0000 mg | ORAL_TABLET | Freq: Four times a day (QID) | ORAL | Status: DC | PRN
  Administered 2016-07-04: 650 mg via ORAL
  Filled 2016-07-02: qty 2

## 2016-07-02 MED ORDER — CITALOPRAM HYDROBROMIDE 10 MG PO TABS
10.0000 mg | ORAL_TABLET | Freq: Every day | ORAL | Status: DC
  Administered 2016-07-03 – 2016-07-05 (×3): 10 mg via ORAL
  Filled 2016-07-02 (×3): qty 1
  Filled 2016-07-02: qty 7
  Filled 2016-07-02: qty 1

## 2016-07-02 MED ORDER — BENZTROPINE MESYLATE 1 MG PO TABS
1.0000 mg | ORAL_TABLET | Freq: Two times a day (BID) | ORAL | Status: DC
  Administered 2016-07-02 – 2016-07-05 (×6): 1 mg via ORAL
  Filled 2016-07-02: qty 1
  Filled 2016-07-02: qty 14
  Filled 2016-07-02 (×2): qty 1
  Filled 2016-07-02: qty 14
  Filled 2016-07-02 (×6): qty 1

## 2016-07-02 MED ORDER — MAGNESIUM HYDROXIDE 400 MG/5ML PO SUSP: 30.0000 mL | Freq: Every day | ORAL | Status: DC | PRN
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
## 2016-07-02 NOTE — Progress Notes (Signed)
Admission Note: Patient is a 40 year old male admitted to the unit from [facility] ED.  Patient under Involuntary admission by father.  IVC paper states patient threatening to shoot his brain out.  Patient is alert and oriented x 4.  Patient currently denies suicidal ideation, auditory and visual hallucination.  Patient states "my father wants to get rid of me that is why he took this paper out on me."  Patient appears anxious and angry on approach.  Admission packet reviewed with patient.  Plan of care reviewed and consent for treatment signed.  Skin assessment completed.  Personal belonging checks by staff.  No contraband was found.  Patient oriented to the unit, staff and room.  Safety checks initiated.  Patient offered support and encouragement as needed.  Patient is safe on the unit.
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
## 2016-07-02 NOTE — ED Notes (Signed)
Raised area noted on right side of pt face near eye. When this nurse asked pt what happened pt reports he has been laying on his side and it is hot in his room. When asked, pt states he did not hit his head on the cabinet. This nurse notified EDP Clarene Duke. [place] EDP at bedside to see patient, does not voice concern about raised area, no new orders.

## 2016-07-02 NOTE — ED Notes (Signed)
Patient noted in room. No complaints, stable, in no acute distress. Q15 minute rounds and monitoring via security cameras continue for safety. 

## 2016-07-02 NOTE — ED Notes (Signed)
Pt has in belonging bag:  [employer], brown belt, cameo crocs, grey t-shirt, grey vapor, brown wallet ([employer] ID, [employer], [employer] debit [phone], Paypal [id number]

## 2016-07-02 NOTE — ED Notes (Signed)
[facility] on unit to transfer pt to [facility] Adult unit per MD order. Pt signed for personal property and property given to [employer] for transport. Pt ambulatory off unit with [employer].

## 2016-07-02 NOTE — ED Provider Notes (Signed)
Called by Psych RN: Cabinet in pt's room found broken approximately ago. Pt told staff he turned around in bed and hit it with is foot. RN noted localized area of redness/swelling to right lateral face. Pt denied injury to his face. I went to examine pt: pt rubbing localized red/swollen area to right lateral face (zygoma area), no ecchymosis, no open wounds; appears NAD, EOMI, resps easy, voice clear. Pt insists his face is not injured and he hit the cabinet with his foot. Pt behaving per his baseline since incident per Psych staff. No clear indication for imaging study at this time. Continue to monitor.    Samuel Jester, DO 07/02/16 1515

## 2016-07-02 NOTE — ED Provider Notes (Signed)
[facility]-EMERGENCY DEPT Provider Note   CSN: [id number] Arrival date & time: 07/02/16  0210  First Provider Contact:  First MD Initiated Contact with Patient 07/02/16 0305        History   Chief Complaint Chief Complaint  Patient presents with  . IVC    HPI Brad Barr is a 40 y.o. male.  Patient with chart hx of schizophrenia, which the patient denies, presents to the emergency department under IVC taken out by his father. IVC papers reference schizophrenic tendencies and mania. IVC papers state that patient has been aggressive towards others and made statements about wanting to "shoot his brains out" so that it will stop. The patient denies any suicidal or homicidal thoughts at this time. He states that he has never had any auditory or visual hallucinations. He denies being on any medications currently. He believes that his past behavioral health hospitalizations have all resulted in him being "misdiagnosed". He has no complaints, otherwise, for this visit. Patient calm and cooperative.      Past Medical History:  Diagnosis Date  . ADHD (attention deficit hyperactivity disorder)   . Schizophrenia (HCC)    Pt requesting this be removed.     Patient Active Problem List   Diagnosis Date Noted  . 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 Homicidal ideation   . 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 Attention deficit hyperactivity disorder (ADHD), predominantly inattentive type   . GAD (generalized anxiety disorder) 11/07/2015  . 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    Home Medications    Prior to Admission medications   Medication Sig Start Date End Date Taking? Authorizing Provider  aspirin 325 MG tablet Take 650 mg by mouth every 6 (six) hours as needed for moderate pain or  headache.   Yes Historical Provider, MD  Aspirin-Salicylamide-Caffeine (BC HEADACHE POWDER PO) Take 1 Package by mouth daily as needed (headache and pain).   Yes Historical Provider, MD  finasteride (PROPECIA) 1 MG tablet Take 1 mg by mouth daily.    Yes Historical Provider, MD  ibuprofen (ADVIL,MOTRIN) 200 MG tablet Take 400 mg by mouth every 6 (six) hours as needed for headache or moderate pain.   Yes Historical Provider, MD  benztropine (COGENTIN) 1 MG tablet Take 1 tablet (1 mg total) by mouth 2 (two) times daily. Patient not taking: Reported on 06/20/2016 02/24/16   Oneta Rack, NP  citalopram (CELEXA) 10 MG tablet Take 1 tablet (10 mg total) by mouth daily. Patient not taking: Reported on 06/20/2016 02/24/16   Oneta Rack, NP  haloperidol (HALDOL) 5 MG tablet Take 1 tablet (5 mg total) by mouth 2 (two) times daily. Patient not taking: Reported on 06/20/2016 02/24/16   Oneta Rack, NP  hydrOXYzine (ATARAX/VISTARIL) 25 MG tablet Take 1 tablet (25 mg total) by mouth every 6 (six) hours as needed for anxiety. Patient not taking: Reported on 06/20/2016 02/24/16   Oneta Rack, NP  nicotine (NICODERM CQ - DOSED IN MG/24 HOURS) 21 mg/24hr patch Place 1 patch (21 mg total) onto the skin daily. Patient not taking: Reported on 06/20/2016 02/24/16   Oneta Rack, NP  traZODone (DESYREL) 50 MG tablet Take 1 tablet (50 mg total) by mouth at bedtime and may repeat dose one time if needed. Patient not taking: Reported on 06/20/2016 02/24/16  Oneta Rack, NP    Family History Family History  Problem Relation Age of Onset  . Mental illness Other     Social History Social History  Substance Use Topics  . Smoking status: Current Some Day Smoker  . Smokeless tobacco: Not on file  . Alcohol use Yes     Allergies   Review of patient's allergies indicates no known allergies.   Review of Systems Review of Systems  Psychiatric/Behavioral: Positive for agitation (per IVC) and behavioral  problems (per IVC). Negative for suicidal ideas.  Ten systems reviewed and are negative for acute change, except as noted in the HPI.    Physical Exam Updated Vital Signs BP 128/92 (BP Location: Right Arm)   Pulse 68   Temp 98.1 F (36.7 C) (Oral)   Resp 16   Ht  (1.88 m)   Wt 99.8 kg   SpO2 98%   BMI 28.25 kg/m   Physical Exam  Constitutional: He is oriented to person, place, and time. He appears well-developed and well-nourished. No distress.  HENT:  Head: Normocephalic and atraumatic.  Eyes: Conjunctivae and EOM are normal. No scleral icterus.  Neck: Normal range of motion.  Pulmonary/Chest: Effort normal. No respiratory distress.  Musculoskeletal: Normal range of motion.  Neurological: He is alert and oriented to person, place, and time.  Skin: Skin is warm and dry. No rash noted. He is not diaphoretic. No erythema. No pallor.  Psychiatric: He has a normal mood and affect. His behavior is normal. He expresses no homicidal and no suicidal ideation.  Nursing note and vitals reviewed.    ED Treatments / Results  Labs (all labs ordered are listed, but only abnormal results are displayed) Labs Reviewed  COMPREHENSIVE METABOLIC PANEL - Abnormal; Notable for the following:       Result Value   Calcium 8.8 (*)    All other components within normal limits  ACETAMINOPHEN LEVEL - Abnormal; Notable for the following:    Acetaminophen (Tylenol), Serum <10 (*)    All other components within normal limits  ETHANOL  SALICYLATE LEVEL  CBC  URINE RAPID DRUG SCREEN, HOSP PERFORMED    EKG  EKG Interpretation None       Radiology No results found.  Procedures Procedures (including critical care time)  Medications Ordered in ED Medications - No data to display   Initial Impression / Assessment and Plan / ED Course  I have reviewed the triage vital signs and the nursing notes.  Pertinent labs & imaging results that were available during my care of the patient  were reviewed by me and considered in my medical decision making (see chart for details).  Clinical Course    Patient presenting under IVC taken out by father. Patient medically cleared. He is pending TTS evaluation and recommendations. Disposition to be determined by oncoming ED provider.   Final Clinical Impressions(s) / ED Diagnoses   Final diagnoses:  Involuntary commitment    New Prescriptions New Prescriptions   No medications on file     Antony Madura, PA-C 07/02/16 1610    Paula Libra, MD 07/02/16 [phone]

## 2016-07-02 NOTE — Tx Team (Signed)
Initial Interdisciplinary Treatment Plan   PATIENT STRESSORS: Financial difficulties Marital or family conflict Medication change or noncompliance Substance abuse   PATIENT STRENGTHS: Ability for insight Average or above average intelligence Communication skills Motivation for treatment/growth Supportive family/friends   PROBLEM LIST: Problem List/Patient Goals Date to be addressed Date deferred Reason deferred Estimated date of resolution  "To work on friends and family" 07/02/16     Anxiety 07/02/16     Agitation 07/02/16     Medication Noncompliance 07/02/16     Suicidal Ideation 07/02/16                              DISCHARGE CRITERIA:  Ability to meet basic life and health needs Adequate post-discharge living arrangements Motivation to continue treatment in a less acute level of care Verbal commitment to aftercare and medication compliance  PRELIMINARY DISCHARGE PLAN: Attend aftercare/continuing care group Outpatient therapy Return to previous living arrangement  PATIENT/FAMIILY INVOLVEMENT: This treatment plan has been presented to and reviewed with the patient, Brad Barr.  The patient and family have been given the opportunity to ask questions and make suggestions.  Mickie Bail 07/02/2016, 4:47 PM

## 2016-07-02 NOTE — BH Assessment (Addendum)
Assessment Note  Brad Barr is a 40 y.o. male, who presents under IVC, taken out by his father. IVC indicates that pt has been hitting himself and stated that he wants to "shoot my brains out", apparently to rid himself of AH. Pt is calm during assessment. Pt denies every allegation. Pt indicates that his father "miscontrued" what he said concerning shooting his brains out. Pt reports that he said he had a headache, not that he wanted to shoot himself. Pt denies experiencing any hallucinations. Pt denies ever hitting himself. Pt denies having any mental health issue, at all.   Diagnosis: Schizophrenia, per hx  Past Medical History:  Past Medical History:  Diagnosis Date  . ADHD (attention deficit hyperactivity disorder)   . Schizophrenia (HCC)    Pt requesting this be removed.     No past surgical history on file.  Family History:  Family History  Problem Relation Age of Onset  . Mental illness Other     Social History:  reports that he has been smoking.  He does not have any smokeless tobacco history on file. He reports that he drinks alcohol. He reports that he uses drugs, including Amphetamines.  Additional Social History:     CIWA: CIWA-Ar BP: 126/76 Pulse Rate: 61 COWS:    Allergies: No Known Allergies  Home Medications:  (Not in a hospital admission)  OB/GYN Status:  No LMP for male patient.  General Assessment Data Location of Assessment: [facility] ED TTS Assessment: In system Is this a Tele or Face-to-Face Assessment?: Face-to-Face Is this an Initial Assessment or a Re-assessment for this encounter?: Initial Assessment Marital status: Single Is patient pregnant?: No Pregnancy Status: No Living Arrangements: Alone Can pt return to current living arrangement?: Yes Admission Status: Involuntary Is patient capable of signing voluntary admission?: Yes Referral Source: Self/Family/Friend Insurance type: none     Crisis Care Plan Living Arrangements: Alone Name of  Psychiatrist: none Name of Therapist: none  Education Status Is patient currently in school?: No  Risk to self with the past 6 months Suicidal Ideation: No Has patient been a risk to self within the past 6 months prior to admission? : Yes Suicidal Intent: No Has patient had any suicidal intent within the past 6 months prior to admission? : No Is patient at risk for suicide?: No Suicidal Plan?: No Has patient had any suicidal plan within the past 6 months prior to admission? : No Access to Means: No What has been your use of drugs/alcohol within the last 12 months?: no use Previous Attempts/Gestures: No Intentional Self Injurious Behavior: Damaging Comment - Self Injurious Behavior: pt has hx of hitting himself and is IVC'd for the same Family Suicide History: Unknown Persecutory voices/beliefs?: No Depression: No Depression Symptoms: Feeling angry/irritable Substance abuse history and/or treatment for substance abuse?: No Suicide prevention information given to non-admitted patients: Not applicable  Risk to Others within the past 6 months Homicidal Ideation: No Does patient have any lifetime risk of violence toward others beyond the six months prior to admission? : No Thoughts of Harm to Others: No Current Homicidal Intent: No Current Homicidal Plan: No Access to Homicidal Means: No History of harm to others?: No Assessment of Violence: None Noted Does patient have access to weapons?: No Criminal Charges Pending?: No Does patient have a court date: No Is patient on probation?: No  Psychosis Hallucinations: None noted Delusions: None noted  Mental Status Report Appearance/Hygiene: Unremarkable Eye Contact: Good Motor Activity: Unremarkable Speech: Logical/coherent Level of  Consciousness: Alert Mood: Apathetic Affect: Appropriate to circumstance Anxiety Level: None Thought Processes: Unable to Assess Judgement: Unable to Assess Orientation: Person, Place, Time,  Situation Obsessive Compulsive Thoughts/Behaviors: None  Cognitive Functioning Concentration: Normal Memory: Unable to Assess IQ: Average Insight: see judgement above Impulse Control: Unable to Assess Appetite: Good Sleep: No Change Vegetative Symptoms: None  ADLScreening [facility] Assessment Services) Patient's cognitive ability adequate to safely complete daily activities?: Yes Patient able to express need for assistance with ADLs?: Yes Independently performs ADLs?: Yes (appropriate for developmental age)  Prior Inpatient Therapy Prior Inpatient Therapy: Yes Prior Therapy Dates: 20106 Prior Therapy Facilty/Provider(s): [facility] Reason for Treatment: schizophrenia  Prior Outpatient Therapy Prior Outpatient Therapy: Yes Prior Therapy Dates: unknown Prior Therapy Facilty/Provider(s): unknown Does patient have an ACCT team?: No Does patient have Intensive In-House Services?  : No Does patient have [facility] services? : No Does patient have P4CC services?: No  ADL Screening (condition at time of admission) Patient's cognitive ability adequate to safely complete daily activities?: Yes Is the patient deaf or have difficulty hearing?: No Does the patient have difficulty seeing, even when wearing glasses/contacts?: No Does the patient have difficulty concentrating, remembering, or making decisions?: No Patient able to express need for assistance with ADLs?: Yes Does the patient have difficulty dressing or bathing?: No Independently performs ADLs?: Yes (appropriate for developmental age) Does the patient have difficulty walking or climbing stairs?: No Weakness of Legs: None Weakness of Arms/Hands: None  Home Assistive Devices/Equipment Home Assistive Devices/Equipment: None  Therapy Consults (therapy consults require a physician order) PT Evaluation Needed: No OT Evalulation Needed: No SLP Evaluation Needed: No Abuse/Neglect Assessment (Assessment to be complete while patient is  alone) Physical Abuse: Denies Verbal Abuse: Denies Sexual Abuse: Denies Exploitation of patient/patient's resources: Denies Self-Neglect: Denies Values / Beliefs Cultural Requests During Hospitalization: None Spiritual Requests During Hospitalization: None Consults Spiritual Care Consult Needed: No Social Work Consult Needed: No Merchant navy officer (For Healthcare) Does patient have an advance directive?: No Would patient like information on creating an advanced directive?: No - patient declined information    Additional Information 1:1 In Past 12 Months?: No CIRT Risk: No Elopement Risk: No Does patient have medical clearance?: Yes     Disposition:  Disposition Initial Assessment Completed for this Encounter: Yes (consulted with Dr. Jannifer Franklin) Disposition of Patient: Inpatient treatment program Type of inpatient treatment program: Adult (TTS to seek placement)  On Site Evaluation by:   Reviewed with Physician:    Laddie Aquas 07/02/2016 1:12 PM

## 2016-07-03 ENCOUNTER — Encounter (HOSPITAL_COMMUNITY): Payer: Self-pay | Admitting: Psychiatry

## 2016-07-03 DIAGNOSIS — F3289 Other specified depressive episodes: Secondary | ICD-10-CM

## 2016-07-03 DIAGNOSIS — F1994 Other psychoactive substance use, unspecified with psychoactive substance-induced mood disorder: Secondary | ICD-10-CM | POA: Diagnosis present

## 2016-07-03 NOTE — [facility] Suicide Risk Assessment (Signed)
[facility] Admission Suicide Risk Assessment   Nursing information obtained from:  Patient Demographic factors:  Male Current Mental Status:  NA Loss Factors:  NA Historical Factors:  NA Risk Reduction Factors:  Living with another person, especially a relative  Total Time spent with patient: 30 minutes Principal Problem: Substance or medication-induced depressive disorder (HCC) Diagnosis:   Patient Active Problem List   Diagnosis Date Noted  . 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 Homicidal ideation [R45.850]   . 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 GAD (generalized anxiety disorder) [F41.1] 11/07/2015  . 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   Continued Clinical Symptoms:  Alcohol Use Disorder Identification Test Final Score (AUDIT): 0 The "Alcohol Use Disorders Identification Test", Guidelines for Use in Primary Care, Second Edition.  World Science writer [facility]). Score between 0-7:  no or low risk or alcohol related problems. Score between 8-15:  moderate risk of alcohol related problems. Score between 16-19:  high risk of alcohol related problems. Score 20 or above:  warrants further diagnostic evaluation for alcohol dependence and treatment.   CLINICAL FACTORS:   Alcohol/Substance Abuse/Dependencies Previous Psychiatric Diagnoses and Treatments   Musculoskeletal: Strength & Muscle Tone: within normal limits Gait & Station: normal Patient leans: N/A  Psychiatric Specialty Exam: Physical Exam  Nursing note and vitals reviewed.   ROS  Blood pressure 132/64, pulse (!) 51, temperature 98.7 F (37.1 C), temperature source Oral, resp. rate 16, height 7\' 2"  (2.184 m), weight 100 kg (220 lb 8 oz), SpO2 96 %.Body mass index is 20.96 kg/m.   Please see H&P.   COGNITIVE  FEATURES THAT CONTRIBUTE TO RISK:  Closed-mindedness, Polarized thinking and Thought constriction (tunnel vision)    SUICIDE RISK:   Moderate:  Frequent suicidal ideation with limited intensity, and duration, some specificity in terms of plans, no associated intent, good self-control, limited dysphoria/symptomatology, some risk factors present, and identifiable protective factors, including available and accessible social support.   PLAN OF CARE: Please see H&P.   I certify that inpatient services furnished can reasonably be expected to improve the patient's condition.  Felipa Laroche, MD 07/03/2016, 2:21 PM

## 2016-07-03 NOTE — [facility] Suicide Risk Assessment (Signed)
[facility] INPATIENT:  Family/Significant Other Suicide Prevention Education  Suicide Prevention Education:  Patient Refusal for Family/Significant Other Suicide Prevention Education: The patient Brad Barr has refused to provide written consent for family/significant other to be provided Family/Significant Other Suicide Prevention Education during admission and/or prior to discharge.  Physician notified.  Sallee Lange 07/03/2016, 10:39 AM

## 2016-07-03 NOTE — Progress Notes (Signed)
   D: Pt was laying in bed during the assessment. Writer attempted to start a conversation with the pt, however, pt was only interested in yes or no questions.  When asked questions pt would hastily answer the questions and cover his head. With each question pt uncovered his head. Pt has no questions or concerns.    A:  Support and encouragement was offered. 15 min checks continued for safety.  R: Pt remains safe.

## 2016-07-03 NOTE — Progress Notes (Signed)
Patient has been isolative to his room for the majority of the shift.  Patient denies SI, HI and AVH this shift.  Patient had no incidents of agitated outbursts and was medication compliant though he did not attend groups.    Assess patient for safety, offer medications as prescribed, encourage patient out of his room  Patient able to contract for safety. Continue to monitor

## 2016-07-03 NOTE — BHH Counselor (Signed)
Adult Comprehensive Assessment  Patient ID: Toddy Waugh, male   DOB: 09/08/1976, 40 y.o.   MRN: [id number]  Information Source: Information source: Patient  Current Stressors:  Educational / Learning stressors: high school graduate Employment / Job issues: sells on [employer] Family Relationships: good w mother and father Surveyor, quantity / Lack of resources (include bankruptcy): limited income Housing / Lack of housing: states he has stable apartment and can return at [employer] Physical health (include injuries & life threatening diseases): no concerns Social relationships: socially isolated, "no friends" Substance abuse: denies all current and former use Bereavement / Loss: no issues reported  Living/Environment/Situation:  Living Arrangements: Alone Living conditions (as described by patient or guardian): lives in own apartment How long has patient lived in current situation?: several years What is atmosphere in current home: Comfortable  Family History:  Marital status: Single Are you sexually active?: No What is your sexual orientation?: would not discuss Has your sexual activity been affected by drugs, alcohol, medication, or emotional stress?: would not discuss Does patient have children?: No  Childhood History:  By whom was/is the patient raised?: Both parents Description of patient's relationship with caregiver when they were a child: "good" Patient's description of current relationship with people who raised him/her: "seeing more of them lately" How were you disciplined when you got in trouble as a child/adolescent?: would not discuss Does patient have siblings?: Yes Number of Siblings: 2 Description of patient's current relationship with siblings: brother and sister, doesnt see much of them Did patient suffer any verbal/emotional/physical/sexual abuse as a child?: No Did patient suffer from severe childhood neglect?: No Has patient ever been sexually abused/assaulted/raped as an  adolescent or adult?: No Was the patient ever a victim of a crime or a disaster?: No Witnessed domestic violence?: No Has patient been effected by domestic violence as an adult?: No  Education:  Highest grade of school patient has completed: high school graduate Currently a Consulting civil engineer?: No Learning disability?: No  Employment/Work Situation:   Employment situation: Employed 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 How long has patient been employed?: 2007 Patient's job has been impacted by current illness: No What is the longest time patient has a held a job?: 8 years 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 Has patient ever been in the [employer]?: No Has patient ever served in combat?: No Did You Receive Any Psychiatric Treatment/Services While in Equities trader?: No Are There Guns or Other Weapons in Your Home?: No  Financial Resources:   Financial resources: Income from employment Does patient have a representative payee or guardian?: No  Alcohol/Substance Abuse:   What has been your use of drugs/alcohol within the last 12 months?: denies all current and former use of drugs and alcohol If attempted suicide, did drugs/alcohol play a role in this?: No Alcohol/Substance Abuse Treatment Hx: Denies past history Has alcohol/substance abuse ever caused legal problems?: No  Social Support System:   Forensic psychologist System: Poor Describe Community Support System: "I dont have any friends" Type of faith/religion: Ephriam Knuckles How does patient's faith help to cope with current illness?: reads Bible and attends church  Leisure/Recreation:   Leisure and Hobbies: reading Bible  Strengths/Needs:   What things does the patient do well?: "I dont know" In what areas does patient struggle / problems for patient: "I dont know"  Discharge Plan:   Does patient have access to transportation?: Yes Will patient be returning to same  living situation after discharge?:  Yes Currently receiving community mental health services: No If no, would patient like referral for services when discharged?: Yes (What county?) ([facility] [place] Does patient have financial barriers related to discharge medications?: No (despite lack of insurance, pt denies issues with affording medications)  Summary/Recommendations:   Summary and Recommendations (to be completed by the evaluator): Patient is a 40 year old male, admitted involuntarily and diagnosed with Schizophrenia.  Per record, patient was troubled by auditory hallucinations and expressed desire to "shoot his brains out."  Patient states he does not feel current hospitalization was necessary and that his goal is to "get out the front door as soon as possible."  Is self employed, lives alone, has contact w parents w whom he has a "good relationship."  No current mental health providers, no insurance.  Did not want referral to financial counselors for possible assistance w Medicaid application.  Plans to return home to apartment at discharge, states he will access services at [facility] [place].    Sallee Lange 07/03/2016

## 2016-07-03 NOTE — BHH Group Notes (Signed)
Patient attend group. His day was a 9. His goal was to get some rest. He was able to rest today.

## 2016-07-03 NOTE — Tx Team (Signed)
Interdisciplinary Treatment Plan Update (Adult)  Date:  07/03/2016   Time Reviewed:  3:24 PM   Progress in Treatment: Attending groups: Yes. Participating in groups:  Yes. Taking medication as prescribed:  Yes. Tolerating medication:  Yes. Family/Significant other contact made:  No Patient understands diagnosis:  No  Limited insight Discussing patient identified problems/goals with staff:  Yes, see initial care plan. Medical problems stabilized or resolved:  Yes. Denies suicidal/homicidal ideation: Yes. Issues/concerns per patient self-inventory:  No. Other:  New problem(s) identified:  Discharge Plan or Barriers: see below  Reason for Continuation of Hospitalization: Depression Hallucinations Medication stabilization  Comments: Patient seen in his room withdrawn , attempts to minimize the reason for his admission. Pt seen as depressed , reports he has several stressors that could be affecting his mood , but he did not mean to say that he wanted to shoot self. Pt with past hx of substance abuse - stimulants , xanax - pt currently denies any abuse . Patient reports he is willing to be continued on celexa and haldol.   Estimated length of stay: 2-5 days  New goal(s):  Review of initial/current patient goals per problem list:   Review of initial/current patient goals per problem list:  1. Goal(s): Patient will participate in aftercare plan   Met: Yes   Target date: 3-5 days post admission date   As evidenced by: Patient will participate within aftercare plan AEB aftercare provider and housing plan at discharge being identified. 07/03/16:  Will return to his apartment, follow up [facility]   2. Goal (s): Patient will exhibit decreased depressive symptoms and suicidal ideations.   Met: No   Target date: 3-5 days post admission date   As evidenced by: Patient will utilize self rating of depression at 3 or below and demonstrate decreased signs of depression or  be deemed stable for discharge by MD. 07/03/16:  Denies SI.  Rates his depression a 5 today      5. Goal(s): Patient will demonstrate decreased signs of psychosis  * Met: Yes  * Target date: 3-5 days post admission date  * As evidenced by: Patient will demonstrate decreased frequency of AVH or return to baseline function 07/03/16:  No signs nor symptoms of psychosis today       Attendees: Patient:  07/03/2016 3:24 PM   Family:   07/03/2016 3:24 PM   Physician:  Ursula Alert, MD 07/03/2016 3:24 PM   Nursing:   Gaylan Gerold, RN 07/03/2016 3:24 PM   CSW:    Roque Lias, LCSW   07/03/2016 3:24 PM   Other:  07/03/2016 3:24 PM   Other:   07/03/2016 3:24 PM   Other:  Lars Pinks, Nurse CM 07/03/2016 3:24 PM   Other:   07/03/2016 3:24 PM   Other:  Norberto Sorenson, Elizabeth  07/03/2016 3:24 PM   Other:  07/03/2016 3:24 PM   Other:  07/03/2016 3:24 PM   Other:  07/03/2016 3:24 PM   Other:  07/03/2016 3:24 PM   Other:  07/03/2016 3:24 PM   Other:   07/03/2016 3:24 PM    Scribe for Treatment Team:   Trish Mage, 07/03/2016 3:24 PM

## 2016-07-03 NOTE — H&P (Signed)
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 Homicidal ideation [R45.850]   . 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 GAD (generalized anxiety disorder) [F41.1] 11/07/2015  . 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 caucasian male,single , lives in [place] , employed  who presented under IVC, taken out by his father for SI with plan.  Per initial notes in EHR " IVC indicates that pt has been hitting himself and stated that he wants to "shoot my brains out", apparently to rid himself of AH. Pt is calm during assessment. Pt denies every allegation. Pt indicates that his father "miscontrued" what he said concerning shooting his brains out. Pt reports that he said he had a headache, not that he wanted to shoot himself. Pt denies experiencing any hallucinations. Pt denies ever hitting himself. Pt denies having any mental health issue, at all. "  Patient seen and chart reviewed.Discussed patient with treatment team.  Patient seen in his room withdrawn , attempts to minimize the reason for his admission. Pt seen as depressed , reports he has several stressors that could be affecting his mood , but he did not mean to say that he wanted to shoot self. Pt with past hx of substance abuse - stimulants , xanax - pt currently denies any abuse . Patient reports he is willing to be continued on celexa and haldol- will continue the same.   Associated  Signs/Symptoms: Depression Symptoms:  depressed mood, anxiety, (Hypo) Manic Symptoms:  Labiality of Mood, Anxiety Symptoms:  Excessive Worry, Psychotic Symptoms:  NA PTSD Symptoms: NA Total Time spent with patient: 45 minutes  Past Psychiatric History: Hx of several admissions to [facility] for substance induced mood sx as well as substance abuse. Pt is noncompliant with out patient care and medications.Pt denies any suicide attempts.  Is the patient at risk to self? Yes.    Has the patient been a risk to self in the past 6 months? No.  Has the patient been a risk to self within the distant past? No.  Is the patient a risk to others? Yes.    Has the patient been a risk to others in the past 6 months? Yes.    Has the patient been a risk to others within the distant past? Yes.     Prior Inpatient Therapy:  see above Prior Outpatient Therapy:    Alcohol Screening: Patient refused Alcohol Screening Tool: Yes 1. How often do you have a drink containing alcohol?: Never 9. Have you or someone else been injured as a result of your drinking?: No 10. Has a relative or friend or a doctor or another health worker been concerned about your drinking or suggested you cut down?: No Alcohol Use Disorder Identification Test Final Score (AUDIT): 0 Brief Intervention: Patient declined brief intervention Substance Abuse History in the last 12 months:  No.stimulants, xanax Consequences of Substance Abuse: NA Previous Psychotropic Medications: Yes -adderall Psychological Evaluations: no Past Medical History:  Past Medical History:  Diagnosis Date  .  ADHD (attention deficit hyperactivity disorder)   . Schizophrenia ([place])    Pt requesting this be removed.    History reviewed. No pertinent surgical history. Family History:  Family History  Problem Relation Age of Onset  . Mental illness Other   . Mental illness Sister    Family Psychiatric  History: see above noted Tobacco Screening: denies Social  History: single, employed , lives by self in [place]. History  Alcohol Use No     History  Drug Use No    Additional Social History: Marital status: Single Are you sexually active?: No What is your sexual orientation?: would not discuss Has your sexual activity been affected by drugs, alcohol, medication, or emotional stress?: would not discuss Does patient have children?: No         Allergies:  No Known Allergies Lab Results:  Results for orders placed or performed during the hospital encounter of 07/02/16 (from the past 48 hour(s))  Rapid urine drug screen (hospital performed)     Status: None   Collection Time: 07/02/16  2:25 AM  Result Value Ref Range   Opiates NONE DETECTED NONE DETECTED   Cocaine NONE DETECTED NONE DETECTED   Benzodiazepines NONE DETECTED NONE DETECTED   Amphetamines NONE DETECTED NONE DETECTED   Tetrahydrocannabinol NONE DETECTED NONE DETECTED   Barbiturates NONE DETECTED NONE DETECTED    Comment:        DRUG SCREEN FOR MEDICAL PURPOSES ONLY.  IF CONFIRMATION IS NEEDED FOR ANY PURPOSE, NOTIFY LAB WITHIN 5 DAYS.        LOWEST DETECTABLE LIMITS FOR URINE DRUG SCREEN Drug Class       Cutoff (ng/mL) Amphetamine      1000 Barbiturate      200 Benzodiazepine   025 Tricyclics       427 Opiates          300 Cocaine          300 THC              50   Comprehensive metabolic panel     Status: Abnormal   Collection Time: 07/02/16  2:31 AM  Result Value Ref Range   Sodium 136 135 - 145 mmol/L   Potassium 3.9 3.5 - 5.1 mmol/L   Chloride 105 101 - 111 mmol/L   CO2 25 22 - 32 mmol/L   Glucose, Bld 93 65 - 99 mg/dL   BUN 12 6 - 20 mg/dL   Creatinine, Ser 0.75 0.61 - 1.24 mg/dL   Calcium 8.8 (L) 8.9 - 10.3 mg/dL   Total Protein 7.6 6.5 - 8.1 g/dL   Albumin 4.2 3.5 - 5.0 g/dL   AST 23 15 - 41 U/L   ALT 32 17 - 63 U/L   Alkaline Phosphatase 65 38 - 126 U/L   Total Bilirubin 0.7 0.3 - 1.2 mg/dL   GFR calc non Af Amer >60 >60 mL/min   GFR calc Af Amer  >60 >60 mL/min    Comment: (NOTE) The eGFR has been calculated using the CKD EPI equation. This calculation has not been validated in all clinical situations. eGFR's persistently <60 mL/min signify possible Chronic Kidney Disease.    Anion gap 6 5 - 15  Ethanol     Status: None   Collection Time: 07/02/16  2:31 AM  Result Value Ref Range   Alcohol, Ethyl (B) <5 <5 mg/dL    Comment:        LOWEST DETECTABLE LIMIT FOR SERUM ALCOHOL IS  5 mg/dL FOR MEDICAL PURPOSES ONLY   Salicylate level     Status: None   Collection Time: 07/02/16  2:31 AM  Result Value Ref Range   Salicylate Lvl <2.5 2.8 - 30.0 mg/dL  Acetaminophen level     Status: Abnormal   Collection Time: 07/02/16  2:31 AM  Result Value Ref Range   Acetaminophen (Tylenol), Serum <10 (L) 10 - 30 ug/mL    Comment:        THERAPEUTIC CONCENTRATIONS VARY SIGNIFICANTLY. A RANGE OF 10-30 ug/mL MAY BE AN EFFECTIVE CONCENTRATION FOR MANY PATIENTS. HOWEVER, SOME ARE BEST TREATED AT CONCENTRATIONS OUTSIDE THIS RANGE. ACETAMINOPHEN CONCENTRATIONS >150 ug/mL AT 4 HOURS AFTER INGESTION AND >50 ug/mL AT 12 HOURS AFTER INGESTION ARE OFTEN ASSOCIATED WITH TOXIC REACTIONS.   cbc     Status: None   Collection Time: 07/02/16  2:31 AM  Result Value Ref Range   WBC 7.7 4.0 - 10.5 K/uL   RBC 4.88 4.22 - 5.81 MIL/uL   Hemoglobin 13.6 13.0 - 17.0 g/dL   HCT 40.0 39.0 - 52.0 %   MCV 82.0 78.0 - 100.0 fL   MCH 27.9 26.0 - 34.0 pg   MCHC 34.0 30.0 - 36.0 g/dL   RDW 13.3 11.5 - 15.5 %   Platelets 217 150 - 400 K/uL    Blood Alcohol level:  Lab Results  Component Value Date   ETH <5 07/02/2016   ETH <5 63/89/3734    Metabolic Disorder Labs:  Lab Results  Component Value Date   HGBA1C 5.5 11/07/2015   MPG 111 11/07/2015   Lab Results  Component Value Date   PROLACTIN 79.3 (H) 02/23/2016   PROLACTIN 63.1 (H) 11/07/2015   Lab Results  Component Value Date   CHOL 208 (H) 11/07/2015   TRIG 181 (H) 11/07/2015   HDL 56  11/07/2015   CHOLHDL 3.7 11/07/2015   VLDL 36 11/07/2015   LDLCALC 116 (H) 11/07/2015    Current Medications: Current Facility-Administered Medications  Medication Dose Route Frequency Provider Last Rate Last Dose  . acetaminophen (TYLENOL) tablet 650 mg  650 mg Oral Q6H PRN Kerrie Buffalo, NP      . alum & mag hydroxide-simeth (MAALOX/MYLANTA) 200-200-20 MG/5ML suspension 30 mL  30 mL Oral Q4H PRN Kerrie Buffalo, NP      . benztropine (COGENTIN) tablet 1 mg  1 mg Oral BID Kerrie Buffalo, NP   1 mg at 07/03/16 0826  . citalopram (CELEXA) tablet 10 mg  10 mg Oral Daily Kerrie Buffalo, NP   10 mg at 07/03/16 2876  . haloperidol (HALDOL) tablet 5 mg  5 mg Oral BID Kerrie Buffalo, NP   5 mg at 07/03/16 0826  . magnesium hydroxide (MILK OF MAGNESIA) suspension 30 mL  30 mL Oral Daily PRN Kerrie Buffalo, NP      . OLANZapine zydis (ZYPREXA) disintegrating tablet 10 mg  10 mg Oral Q8H PRN Kerrie Buffalo, NP      . traZODone (DESYREL) tablet 50 mg  50 mg Oral QHS,MR X 1 Kerrie Buffalo, NP       PTA Medications: Prescriptions Prior to Admission  Medication Sig Dispense Refill Last Dose  . aspirin 325 MG tablet Take 650 mg by mouth every 6 (six) hours as needed for moderate pain or headache.   Past Month at Unknown time  . Aspirin-Salicylamide-Caffeine (BC HEADACHE POWDER PO) Take 1 Package by mouth daily as needed (headache and pain).   Past Month at Unknown time  .  benztropine (COGENTIN) 1 MG tablet Take 1 tablet (1 mg total) by mouth 2 (two) times daily. (Patient not taking: Reported on 06/20/2016) 60 tablet 0 Not Taking at Unknown time  . citalopram (CELEXA) 10 MG tablet Take 1 tablet (10 mg total) by mouth daily. (Patient not taking: Reported on 06/20/2016) 30 tablet 0 Completed Course at Unknown time  . finasteride (PROPECIA) 1 MG tablet Take 1 mg by mouth daily.    07/01/2016 at Unknown time  . haloperidol (HALDOL) 5 MG tablet Take 1 tablet (5 mg total) by mouth 2 (two) times daily. (Patient not  taking: Reported on 06/20/2016) 60 tablet 0 Completed Course at Unknown time  . hydrOXYzine (ATARAX/VISTARIL) 25 MG tablet Take 1 tablet (25 mg total) by mouth every 6 (six) hours as needed for anxiety. (Patient not taking: Reported on 06/20/2016) 30 tablet 0 Completed Course at Unknown time  . ibuprofen (ADVIL,MOTRIN) 200 MG tablet Take 400 mg by mouth every 6 (six) hours as needed for headache or moderate pain.   Past Month at Unknown time  . nicotine (NICODERM CQ - DOSED IN MG/24 HOURS) 21 mg/24hr patch Place 1 patch (21 mg total) onto the skin daily. (Patient not taking: Reported on 06/20/2016) 28 patch 0 Completed Course at Unknown time  . traZODone (DESYREL) 50 MG tablet Take 1 tablet (50 mg total) by mouth at bedtime and may repeat dose one time if needed. (Patient not taking: Reported on 06/20/2016) 30 tablet 0 Completed Course at Unknown time    Musculoskeletal: Strength & Muscle Tone: within normal limits Gait & Station: normal Patient leans: N/A  Psychiatric Specialty Exam: Physical Exam  Nursing note and vitals reviewed. Constitutional:  I concur with PE done in ED.  Skin:       Review of Systems  Psychiatric/Behavioral: Positive for depression and suicidal ideas (on admission). Negative for hallucinations and substance abuse. The patient is nervous/anxious.   All other systems reviewed and are negative.   Blood pressure 132/64, pulse (!) 51, temperature 98.7 F (37.1 C), temperature source Oral, resp. rate 16, height _0  (2.184 m), weight 100 kg (220 lb 8 oz), SpO2 96 %.Body mass index is 20.96 kg/m.  General Appearance: Neat  Eye Contact::  Poor  Speech:  Clear and Coherent  Volume:  Normal  Mood:  Anxious and Depressed  Affect:  Congruent  Thought Process:  Goal Directed and Descriptions of Associations: Circumstantial  Orientation:  Full (Time, Place, and Person)  Thought Content:  Hallucinations: Auditory on admission - currently denies  Suicidal Thoughts:  No  reported to father he wanted to shoot self - denies it now  Homicidal Thoughts:  Nowas aggressive at home  Memory:  Immediate;   Fair Recent;   Fair Remote;   Fair  Judgement:  Impaired  Insight:  Lacking  Psychomotor Activity:  Normal  Concentration:  Fair  Recall:  [employer] of Knowledge:Fair  Language: Good  Akathisia:  Negative  Handed:  Right  AIMS (if indicated):     Assets:  Communication Skills  ADL's:  Intact  Cognition: WNL  Sleep:  Number of Hours: 6.5   Treatment Plan Summary:Ida Hartog is a 40 y.o. caucasian male,single , lives in [place] , employed  who presented under IVC, taken out by his father for SI with plan. Patient needs to be observed on the unit .  Patient will benefit from inpatient treatment and stabilization.  Estimated length of stay is 5-7 days.  Reviewed past medical  records,treatment plan.  Will restart celexa 10 mg po daily for affective sx. Will start Haldol 5 mg po bid for mood sx/psychosis. Will continue Cogentin 1 mg po bid for eps. Will make available prn medications as per agitation protocol. Will continue to monitor vitals ,medication compliance and treatment side effects while patient is here.  Will monitor for medical issues as well as call consult as needed.  Reviewed labs uds- negative , cbc- wnl, cmp -wnl, lipid panel - abnormal - will recommend diet control. CSW will start working on disposition.  Patient to participate in therapeutic milieu .       Observation Level/Precautions:  15 minute checks  Laboratory:  per ED  Psychotherapy:  Group milieu  Medications:  Haldol 5 mg BID psychosis, Cogentin 1 mg BID for EPS  Consultations:  As needed  Discharge Concerns:  safety  Estimated LOS:  2-7 days  Other:     I certify that inpatient services furnished can reasonably be expected to improve the patient's condition.    Brad Aultman, MD  7/26/20172:38 PM

## 2016-07-03 NOTE — BHH Group Notes (Signed)
[facility] Group Therapy  07/03/2016 , 1:07 PM    Type of Therapy:  M[employer] Presentation  Participation Level:  Active  Participation Quality:  Attentive  Affect:  Blunted  Cognitive:  Oriented  Insight:  Limited  Engagement in Therapy:  Engaged  Modes of Intervention:  Discussion, Education and Socialization  Summary of Progress/Problems:  [name] from [employer] came to present his recovery story and play the guitar.   Invited.  Chose to not attend.  Daryel Gerald B 07/03/2016 , 1:07 PM

## 2016-07-04 NOTE — Progress Notes (Signed)
D:Pt has been withdrawn and in his room much of the morning. Pt reports no anxiety or depression. Pt denies having suicide or homicide thoughts. He denies hallucinations.  A:Offered support, encouragement and 15 minute checks. R:Safety maintained on the 500 hall.

## 2016-07-04 NOTE — Progress Notes (Signed)
   D: Pt was more alert and spontaneous with the writer than previous day. Pt was laying down prior to the assessment. When asked about his day, stated it was alright. Informed pt that he didn't get his scheduled trazodone the night before because he was sleeping. Asked if he wanted it tonight and he agreed to take. Pt has no questions or concerns.   A: pt was given scheduled trazodone. Support and encouragement was offered. 15 min checks continued for safety.  R: Pt remains safe.

## 2016-07-04 NOTE — Progress Notes (Signed)
[facility] MD Progress Note  07/04/2016 12:35 PM Brad Barr  MRN:  [id number] Subjective:  Patient states " I am fine."    Objective:Brad Barr is a 40 y.o.caucasian male, who is self employed , lives by self in [place] , is single, who was brought in to [facility] petitioned by dad after he endorsed SI with plan.   Patient seen and chart reviewed today.Discussed patient with treatment team.  Pt today is seen as alert, oriented , calm . Pt denies any new concerns. Pt is compliant on medications, denies ADRs. Pt reports he remains motivated to get help with his substance abuse and wants to continue to stay away. Per staff - no disruptive issues noted on the unit.      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 Homicidal ideation [R45.850]   . 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 GAD (generalized anxiety disorder) [F41.1] 11/07/2015  . 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 Pt denies hx of suicide attempts  Past Medical History:  Past Medical History:  Diagnosis Date  . ADHD (attention deficit hyperactivity disorder)   . Schizophrenia (HCC)    Pt requesting this be removed.    History reviewed. No pertinent surgical history. Family History:  Family History  Problem Relation Age of Onset  . Mental illness Other   . Mental illness Sister    Family Psychiatric  History: Pt reports " My whole family has mental illness.'Not able to elaborate further.But does state that his sister has hx of drug abuse. Social  History: Pt is single , self employed, works for [employer] .  History  Alcohol Use No     History  Drug Use No    Social History   Social History  . Marital status: Single    Spouse name: N/A  . Number of children: N/A  . Years of education: N/A   Social History Main Topics  . Smoking status: Current Some Day Smoker  . Smokeless tobacco: Never Used     Comment: Patient uses vapor.  . Alcohol use No  . Drug use: No  . Sexual activity: No   Other Topics Concern  . None   Social History Narrative  . None   Additional Social History:                         Sleep: Fair  Appetite:  Fair  Current Medications: Current Facility-Administered Medications  Medication Dose Route Frequency Provider Last Rate Last Dose  . acetaminophen (TYLENOL) tablet 650 mg  650 mg Oral Q6H PRN Adonis Brook, NP      . alum & mag hydroxide-simeth (MAALOX/MYLANTA) 200-200-20 MG/5ML suspension 30 mL  30 mL Oral Q4H PRN Adonis Brook, NP      . benztropine (COGENTIN) tablet 1 mg  1 mg Oral BID Adonis Brook, NP   1 mg at 07/04/16 0839  . citalopram (CELEXA) tablet 10 mg  10 mg Oral Daily Adonis Brook, NP   10 mg at 07/04/16 0839  . haloperidol (HALDOL) tablet  5 mg  5 mg Oral BID Adonis Brook, NP   5 mg at 07/04/16 0839  . magnesium hydroxide (MILK OF MAGNESIA) suspension 30 mL  30 mL Oral Daily PRN Adonis Brook, NP      . OLANZapine zydis (ZYPREXA) disintegrating tablet 10 mg  10 mg Oral Q8H PRN Adonis Brook, NP      . traZODone (DESYREL) tablet 50 mg  50 mg Oral QHS,MR X 1 Adonis Brook, NP   50 mg at 07/03/16 2105    Lab Results: No results found for this or any previous visit (from the past 48 hour(s)).  Physical Findings: AIMS: Facial and Oral Movements Muscles of Facial Expression: None, normal Lips and Perioral Area: None, normal Jaw: None, normal Tongue: None, normal,Extremity Movements Upper (arms, wrists, hands, fingers): None, normal Lower (legs, knees, ankles,  toes): None, normal, Trunk Movements Neck, shoulders, hips: None, normal, Overall Severity Severity of abnormal movements (highest score from questions above): None, normal Incapacitation due to abnormal movements: None, normal Patient's awareness of abnormal movements (rate only patient's report): No Awareness, Dental Status Current problems with teeth and/or dentures?: No Does patient usually wear dentures?: No  CIWA:    COWS:     Musculoskeletal: Strength & Muscle Tone: within normal limits Gait & Station: normal Patient leans: N/A  Psychiatric Specialty Exam: Review of Systems  Psychiatric/Behavioral: Positive for substance abuse. The patient is nervous/anxious.   All other systems reviewed and are negative.   Blood pressure 106/65, pulse 76, temperature 98.8 F (37.1 C), temperature source Oral, resp. rate 16, height  (2.184 m), weight 100 kg (220 lb 8 oz), SpO2 96 %.Body mass index is 20.96 kg/m.  General Appearance: Fairly Groomed  Patent attorney::  Fair  Speech:  Clear and Coherent  Volume:  Normal  Mood:  Anxious  Affect:  Appropriate  Thought Process:  Coherent  Orientation:  Full (Time, Place, and Person)  Thought Content:  Rumination  Suicidal Thoughts:  No  Homicidal Thoughts:  No  Memory:  Immediate;   Fair Recent;   Fair Remote;   Fair  Judgement:  Impaired  Insight:  Shallow  Psychomotor Activity:  Restlessness  Concentration:  Fair  Recall:  [employer] of Knowledge:Fair  Language: Fair  Akathisia:  No  Handed:  Right  AIMS (if indicated):     Assets:  Desire for Improvement Social Support  ADL's:  Intact  Cognition: WNL  Sleep:  Number of Hours: 6.75     Treatment Plan Summary:Patient presented after petitioned by family for self injurious behavior. Patient seen as withdrawn, but is calm, cooperative.  Will continue treatment.   Daily contact with patient to assess and evaluate symptoms and progress in treatment and Medication  management  Will continue Celexa 10 mg po daily for affective sx. Will continue  Haldol 5 mg po bid for mood lability/psychosis. Will continue Cogentin 1 mg po qhs for EPS. Will continue Trazodone 50 mg po qhs for sleep. Will make available PRN medications as per agitation protocol. Will continue to monitor vitals ,medication compliance and treatment side effects while patient is here.  Will monitor for medical issues as well as call consult as needed. . CSW will continue working on disposition.  Patient to participate in therapeutic milieu .   Detta Mellin MD 07/04/2016, 12:35 PM

## 2016-07-04 NOTE — Progress Notes (Signed)
Patient attended karaoke group this evening.  

## 2016-07-04 NOTE — Plan of Care (Signed)
Problem: Medication: Goal: Compliance with prescribed medication regimen will improve Outcome: Progressing Pt is taking medications as prescribed.   

## 2016-07-04 NOTE — [facility] Group Notes (Signed)
[facility] LCSW Group Therapy  07/04/2016 6:12 PM  Type of Therapy:  Group Therapy  Participation Level:  Miminal  Participation Quality:  Miminal  Affect:  Appropriate  Cognitive:  Appropriate  Insight:  Limited  Engagement in Therapy: Limited  Modes of Intervention:  Discussion, Exploration, Socialization and Support  Summary of Progress/Problems:  Finding Balance in Life. Today's group focused on defining balance in one's own words, identifying things that can knock one off balance, and exploring healthy ways to maintain balance in life. Group members were asked to provide an example of a time when they felt off balance, describe how they handled that situation, and process healthier ways to regain balance in the future. Group members were asked to share the most important tool for maintaining balance that they learned while at [facility] and how they plan to apply this method after discharge.  Was silent through most of group, left halfway through but was able to identify church, family and prayer as important to balanced life.  Brad Barr 07/04/2016, 6:12 PM

## 2016-07-04 NOTE — Progress Notes (Signed)
Patient ID: Brad Barr, male   DOB: [birth year], 40 y.o.   MRN: [id number] D: Client visible on the unit, reports "good day, just waking up being able to socialize and function." Client denies SHI. A: Writer provided emotional support, encouraged client to report any concerns. Medications reviewed, administered as ordered. Staff will monitor q76min for safety. R: Client is safe on the unit, attended karaoke.

## 2016-07-05 MED ORDER — HALOPERIDOL 5 MG PO TABS: 5.0000 mg | ORAL_TABLET | Freq: Two times a day (BID) | ORAL | 0 refills | Status: DC

## 2016-07-05 MED ORDER — BENZTROPINE MESYLATE 1 MG PO TABS: 1.0000 mg | ORAL_TABLET | Freq: Two times a day (BID) | ORAL | 0 refills | Status: DC

## 2016-07-05 MED ORDER — CITALOPRAM HYDROBROMIDE 10 MG PO TABS: 10.0000 mg | ORAL_TABLET | Freq: Every day | ORAL | 0 refills | Status: DC

## 2016-07-05 NOTE — Plan of Care (Signed)
Problem: Self-Concept: Goal: Ability to modify response to factors that promote anxiety will improve Outcome: Progressing Client recognizes decreased level of anxiety AEB "good day, waking up being able to socialize and function"

## 2016-07-05 NOTE — Progress Notes (Signed)
  [facility] Adult Case Management Discharge Plan :  Will you be returning to the same living situation after discharge:  Yes,  home At discharge, do you have transportation home?: Yes,  bus pass/friend Do you have the ability to pay for your medications: Yes,  mental health  Release of information consent forms completed and submitted to medical records by CSW. Patient to Follow up at: Follow-up Information    [facility]. Go in 3 day(s).   Specialty:  Professional Counselor Why:  Please use Open Access hours to establish for services.  Clinic hours are Monday - Friday 8:30 - 12:00.  Bring hospital discharge paperwork to this appointment.  Go within one week of discharge so you can establish as a patient. Contact information: [address] [place] [address] [phone]           Next level of care provider has access to [facility] Link:no  Safety Planning and Suicide Prevention discussed: Yes,  SPE completed with pt; pt declined to consent to family contact. SPI pamphlet and Mobile Crisis information provided to pt and he was encouraged to share information with support network, ask questions, and talk about any concerns relating to SPE.  Have you used any form of tobacco in the last 30 days? (Cigarettes, Smokeless Tobacco, Cigars, and/or Pipes): No  Has patient been referred to the Quitline?: N/A patient is not a smoker  Patient has been referred for addiction treatment: Yes  Smart, Daxter Paule LCSW 07/05/2016, 8:46 AM

## 2016-07-05 NOTE — Progress Notes (Signed)
Data. Patient denies SI/HI/AVH.   Patient interacting well with staff and other patients.  Action. Emotional support and encouragement offered. Education provided on medication, indications and side effect. Q 15 minute checks done for safety. Response. Safety on the unit maintained through 15 minute checks.  Medications taken as prescribed. Attended groups. Remained calm and appropriate through out shift.  Pt. discharged to lobby.  Belongings sheet reviewed and signed by patient and all belongings sent home, including medication samples for 7 days, scripts and a bus pass.Brad Barr reviewed and patient able to verbalize understanding of education. Patient in no current distress and ambulatory.

## 2016-07-05 NOTE — Discharge Summary (Signed)
Physician Discharge Summary Note  Patient:  Brad Barr is an 39 y.o., male MRN:  [id number] DOB:  03/02/1976 Patient phone:  [phone] (home)  Patient address:   [address] [place] [address],  Total Time spent with patient: 45 minutes  Date of Admission:  07/02/2016 Date of Discharge: 07/05/2016  Reason for Admission:  stated that he wants to "shoot my brains out", apparently to rid himself of AHJohnny Barr a 39 y.o.caucasian male,single , lives in [place] , employed  who presented under IVC, taken out by his father for SI with plan.  Brad Barr was admitted for Substance or medication-induced depressive disorder (HCC) and crisis management.  He was treated with Citalopram 10 mg depression, Haldol 5 mg mood stabilization/psychosis, Zyprexa zydis 10 mg psychosis and Trazodone 50 mg insomnia.  Medical problems were identified and treated as needed.  Home medications were restarted as appropriate.  Improvement was monitored by observation and Brad Barr daily report of symptom reduction.  Emotional and mental status was monitored by daily self inventory reports completed by Brad Barr and clinical staff.  Patient reported continued improvement, denied any new concerns.  Patient had been compliant on medications and denied side effects.  Support and encouragement was provided.    At time of discharge, patient rated both depression and anxiety levels to be manageable and minimal.  Patient encouraged to attend groups to help with recognizing triggers of emotional crises and de-stabilizations.  Patient encouraged to attend group to help identify the positive things in life that would help in dealing with feelings of loss, depression and unhealthy or abusive tendencies.         Brad Barr was evaluated by the treatment team for stability and plans for continued recovery upon discharge.  He was offered further treatment options upon discharge including Residential, Intensive Outpatient  and Outpatient treatment.  He will follow up with agency listed below for medication management and counseling.  Encouraged patient to maintain satisfactory support network and home environment.  Advised to adhere to medication compliance and outpatient treatment follow up.  Prescriptions provided.       Brad Barr motivation was an integral factor for scheduling further treatment.  Employment, transportation, bed availability, health status, family support, and any pending legal issues were also considered during his hospital stay.  Upon completion of this admission the patient was both mentally and medically stable for discharge denying suicidal/homicidal ideation, auditory/visual/tactile hallucinations, delusional thoughts and paranoia.      Principal Problem: Substance or medication-induced depressive disorder [facility]) Discharge Diagnoses: Patient Active Problem List   Diagnosis Date Noted  . 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 Homicidal ideation [R45.850]   . 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 GAD (generalized anxiety disorder) [F41.1] 11/07/2015  . 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 ADHD (attention deficit hyperactivity disorder)   . Schizophrenia (HCC)    Pt requesting this be removed.    History reviewed. No pertinent surgical history. Family History:  Family History  Problem Relation Age of Onset  . Mental illness Other   . Mental illness Sister    Family Psychiatric  History: see HPI Social History:  History  Alcohol Use No     History  Drug Use No    Social History  Social History  . Marital status: Single    Spouse name: N/A  . Number of children: N/A  . Years of education: N/A   Social  History Main Topics  . Smoking status: Current Some Day Smoker  . Smokeless tobacco: Never Used     Comment: Patient uses vapor.  . Alcohol use No  . Drug use: No  . Sexual activity: No   Other Topics Concern  . None   Social History Narrative  . None    Hospital Course:  Brad Barr a 39 y.o.caucasian male,single , lives in [place], employed  who presented under IVC, taken out by his father for SI with plan.  Brad Barr was admitted for Substance or medication-induced depressive disorder (HCC) and crisis management.  He was treated with  .  Medical problems were identified and treated as needed.  Home medications were restarted as appropriate.  Improvement was monitored by observation and Brad Barr daily report of symptom reduction.  Emotional and mental status was monitored by daily self inventory reports completed by Brad Barr and clinical staff.  Patient reported continued improvement, denied any new concerns.  Patient had been compliant on medications and denied side effects.  Support and encouragement was provided.    Patient encouraged to attend groups to help with recognizing triggers of emotional crises and de-stabilizations.  Patient encouraged to attend group to help identify the positive things in life that would help in dealing with feelings of loss, depression and unhealthy or abusive tendencies.         Brad Barr was evaluated by the treatment team for stability and plans for continued recovery upon discharge.  He was offered further treatment options upon discharge including Residential, Intensive Outpatient and Outpatient treatment.  He will follow up with agency listed below for medication management and counseling.  Encouraged patient to maintain satisfactory support network and home environment.  Advised to adhere to medication compliance and outpatient treatment follow up.  Prescriptions provided.       Brad Barr motivation was an integral factor for scheduling  further treatment.  Employment, transportation, bed availability, health status, family support, and any pending legal issues were also considered during his hospital stay.  Upon completion of this admission the patient was both mentally and medically stable for discharge denying suicidal/homicidal ideation, auditory/visual/tactile hallucinations, delusional thoughts and paranoia.      Physical Findings: AIMS: Facial and Oral Movements Muscles of Facial Expression: None, normal Lips and Perioral Area: None, normal Jaw: None, normal Tongue: None, normal,Extremity Movements Upper (arms, wrists, hands, fingers): None, normal Lower (legs, knees, ankles, toes): None, normal, Trunk Movements Neck, shoulders, hips: None, normal, Overall Severity Severity of abnormal movements (highest score from questions above): None, normal Incapacitation due to abnormal movements: None, normal Patient's awareness of abnormal movements (rate only patient's report): No Awareness, Dental Status Current problems with teeth and/or dentures?: No Does patient usually wear dentures?: No  CIWA:    COWS:     Musculoskeletal: Strength & Muscle Tone: within normal limits Gait & Station: normal Patient leans: N/A  Psychiatric Specialty Exam: Physical Exam  Vitals reviewed. Psychiatric: He has a normal mood and affect. His speech is normal and behavior is normal. Judgment and thought content normal. Cognition and memory are normal.    Review of Systems  Constitutional: Negative.   HENT: Negative.   Eyes: Negative.   Respiratory: Negative.   Cardiovascular: Negative.   Gastrointestinal: Negative.   Genitourinary: Negative.   Musculoskeletal: Negative.  Skin: Negative.   Neurological: Negative.   Endo/Heme/Allergies: Negative.   Psychiatric/Behavioral: Negative.     Blood pressure 110/68, pulse 60, temperature 98.7 F (37.1 C), temperature source Oral, resp. rate 16, height 7\' 2"  (2.184 m), weight 100 kg  (220 lb 8 oz), SpO2 96 %.Body mass index is 20.96 kg/m.   Have you used any form of tobacco in the last 30 days? (Cigarettes, Smokeless Tobacco, Cigars, and/or Pipes): No  Has this patient used any form of tobacco in the last 30 days? (Cigarettes, Smokeless Tobacco, Cigars, and/or Pipes) Yes, N/A  Blood Alcohol level:  Lab Results  Component Value Date   ETH <5 07/02/2016   ETH <5 02/16/2016    Metabolic Disorder Labs:  Lab Results  Component Value Date   HGBA1C 5.5 11/07/2015   MPG 111 11/07/2015   Lab Results  Component Value Date   PROLACTIN 79.3 (H) 02/23/2016   PROLACTIN 63.1 (H) 11/07/2015   Lab Results  Component Value Date   CHOL 208 (H) 11/07/2015   TRIG 181 (H) 11/07/2015   HDL 56 11/07/2015   CHOLHDL 3.7 11/07/2015   VLDL 36 11/07/2015   LDLCALC 116 (H) 11/07/2015    See Psychiatric Specialty Exam and Suicide Risk Assessment completed by Attending Physician prior to discharge.  Discharge destination:  Home  Is patient on multiple antipsychotic therapies at discharge:  No   Has Patient had three or more failed trials of antipsychotic monotherapy by history:  No  Recommended Plan for Multiple Antipsychotic Therapies: NA     Medication List    STOP taking these medications   aspirin 325 MG tablet   BC HEADACHE POWDER PO   finasteride 1 MG tablet Commonly known as:  PROPECIA   hydrOXYzine 25 MG tablet Commonly known as:  ATARAX/VISTARIL   ibuprofen 200 MG tablet Commonly known as:  ADVIL,MOTRIN   nicotine 21 mg/24hr patch Commonly known as:  NICODERM CQ - dosed in mg/24 hours   traZODone 50 MG tablet Commonly known as:  DESYREL     TAKE these medications     Indication  benztropine 1 MG tablet Commonly known as:  COGENTIN Take 1 tablet (1 mg total) by mouth 2 (two) times daily.  Indication:  Extrapyramidal Reaction caused by Medications   citalopram 10 MG tablet Commonly known as:  CELEXA Take 1 tablet (10 mg total) by mouth  daily.  Indication:  Depression, mood stabilization   haloperidol 5 MG tablet Commonly known as:  HALDOL Take 1 tablet (5 mg total) by mouth 2 (two) times daily.  Indication:  Severe Problems with Behavior      Follow-up Information    [facility]. Go in 3 day(s).   Specialty:  Professional Counselor Why:  Please use Open Access hours to establish for services.  Clinic hours are Monday - Friday 8:30 - 12:00.  Bring hospital discharge paperwork to this appointment.  Go within one week of discharge so you can establish as a patient. Contact information: [address] [place] [address] [phone]           Follow-up recommendations:  Activity:  as tol Diet:  as tol  Comments:  1.  Take all your medications as prescribed.   2.  Report any adverse side effects to outpatient provider. 3.  Patient instructed to not use alcohol or illegal drugs while on prescription medicines. 4.  In the event of worsening symptoms, instructed patient to call 911, the crisis hotline  or go to nearest emergency room for evaluation of symptoms.  Signed: Lindwood Qua, NP [facility] 07/05/2016, 12:43 PM

## 2016-07-05 NOTE — [facility] Suicide Risk Assessment (Signed)
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 Homicidal ideation [R45.850]   . 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 GAD (generalized anxiety disorder) [F41.1] 11/07/2015  . 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 The patient is not nervous/anxious.   All other systems reviewed and are negative.   Blood pressure 110/68, pulse 60, temperature 98.7 F (37.1 C), temperature source Oral, resp. rate 16, height 7\' 2"  (2.184 m), weight 100 kg (220 lb 8 oz), SpO2 96 %.Body mass index is 20.96 kg/m.  General Appearance: Casual  Eye Contact::  Fair  Speech:  Normal Rate409  Volume:  Normal  Mood:  Euthymic  Affect:  Appropriate  Thought Process:  Goal Directed and Descriptions of Associations: Intact  Orientation:  Full (Time, Place, and Person)  Thought Content:  Logical  Suicidal Thoughts:  No  Homicidal Thoughts:  No  Memory:  Immediate;   Fair Recent;   Fair Remote;   Fair  Judgement:  Fair  Insight:  Fair  Psychomotor Activity:  Normal  Concentration:  Fair  Recall:  [employer] of Knowledge:Fair  Language: Fair  Akathisia:  No  Handed:  Right  AIMS (if indicated):     Assets:  Desire for Improvement  Sleep:  Number of Hours: 6.75  Cognition: WNL  ADL's:  Intact   Mental Status Per Nursing Assessment::   On Admission:  NA  Demographic Factors:   Male and Caucasian  Loss Factors: Decrease in vocational status  Historical Factors: Impulsivity  Risk Reduction Factors:   Positive social support and Positive therapeutic relationship  Continued Clinical Symptoms:  Alcohol/Substance Abuse/Dependencies Previous Psychiatric Diagnoses and Treatments  Cognitive Features That Contribute To Risk:  Closed-mindedness, Polarized thinking and Thought constriction (tunnel vision)    Suicide Risk:  Minimal: No identifiable suicidal ideation.  Patients presenting with no risk factors but with morbid ruminations; may be classified as minimal risk based on the severity of the depressive symptoms  Follow-up Information    [facility]. Go in 3 day(s).   Specialty:  Professional Counselor Why:  Please use Open Access hours to establish for services.  Clinic hours are Monday - Friday 8:30 - 12:00.  Bring hospital discharge paperwork to this appointment.  Go within one week of discharge so you can establish as a patient. Contact information: [address] [place] [address] [phone]           Plan Of Care/Follow-up recommendations:  Activity:  no restrictions Diet:  regular Tests:  as needed Other:  follow up with aftercare  Branda Chaudhary, MD 07/05/2016, 9:17 AM

## 2016-07-05 NOTE — Tx Team (Signed)
Interdisciplinary Treatment Plan Update (Adult)  Date:  07/05/2016   Time Reviewed:  8:48 AM   Progress in Treatment: Attending groups: Yes. Participating in groups:  Yes. Taking medication as prescribed:  Yes. Tolerating medication:  Yes. Family/Significant other contact made:  SPE completed with pt; pt declined to consent to family contact.  Patient understands diagnosis:  No  Limited insight Discussing patient identified problems/goals with staff:  Yes, see initial care plan. Medical problems stabilized or resolved:  Yes. Denies suicidal/homicidal ideation: Yes. Issues/concerns per patient self-inventory:  No. Other:  Discharge Plan or Barriers: Pt plans to return home; follow-up at [facility]  Reason for Continuation of Hospitalization: none   Estimated length of stay: d/c today   New goal(s):  Review of initial/current patient goals per problem list:   Review of initial/current patient goals per problem list:  1. Goal(s): Patient will participate in aftercare plan   Met: Yes   Target date: 3-5 days post admission date   As evidenced by: Patient will participate within aftercare plan AEB aftercare provider and housing plan at discharge being identified. 07/03/16:  Will return to his apartment, follow up Family Services   2. Goal (s): Patient will exhibit decreased depressive symptoms and suicidal ideations.   Met:Yes   Target date: 3-5 days post admission date   As evidenced by: Patient will utilize self rating of depression at 3 or below and demonstrate decreased signs of depression or be deemed stable for discharge by MD. 07/03/16:  Denies SI.  Rates his depression a 5 today 07/05/2016: Pt denies SI/HI/AVH and rates depression as 3/10. He presents with pleasant mood/calm affect.    5. Goal(s): Patient will demonstrate decreased signs of psychosis  * Met: Yes  * Target date: 3-5 days post admission date  * As evidenced by: Patient will demonstrate decreased  frequency of AVH or return to baseline function 07/03/16:  No signs nor symptoms of psychosis today       Attendees: Patient:  07/05/2016 8:48 AM   Family:   07/05/2016 8:48 AM   Physician:  Saramma Eappen, MD 07/05/2016 8:48 AM   Nursing:   Penny, Elizabeth RN 07/05/2016 8:48 AM   CSW:   Smart, LCSW 07/05/2016 8:48 AM   Other: May Augustin NP 07/05/2016 8:48 AM   Other:   07/05/2016 8:48 AM   Other:  07/05/2016 8:48 AM   Other:   07/05/2016 8:48 AM   Other:  07/05/2016 8:48 AM   Other:  07/05/2016 8:48 AM   Other:  07/05/2016 8:48 AM   Other:  07/05/2016 8:48 AM   Other:  07/05/2016 8:48 AM   Other:  07/05/2016 8:48 AM   Other:   07/05/2016 8:48 AM    Scribe for Treatment Team:    Smart, MSW, LCSW Clinical Social Worker 07/05/2016 9:44 AM     

## 2016-07-14 NOTE — ED Provider Notes (Signed)
[facility]-EMERGENCY DEPT Provider Note   CSN: [id number] Arrival date & time: 06/20/16  1536  First Provider Contact:  First MD Initiated Contact with Patient 06/20/16 1925        History   Chief Complaint Chief Complaint  Patient presents with  . Migraine    HPI  HPI  Brad Barr is a 40 y.o. male PMH significant for ADHD, schizophrenia presenting with a 6 month history of migraine, dizziness, muscle spasms that has become worse over the last day. He describes his headache as 5/10 pain scale, constant, diffuse, without alleviating or exacerbating factors. He denies fevers, chills, N/V, changes in bowel/bladder habits.       Past Medical History:  Diagnosis Date  . ADHD (attention deficit hyperactivity disorder)   . Schizophrenia (HCC)    Pt requesting this be removed.     Patient Active Problem List   Diagnosis Date Noted  . 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 Homicidal ideation   . Attention deficit hyperactivity disorder (ADHD), predominantly inattentive type   . GAD (generalized anxiety disorder) 11/07/2015  . 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 No pertinent surgical history.     Home Medications    Prior to Admission medications   Medication Sig Start Date End Date Taking? Authorizing Provider  benztropine (COGENTIN) 1 MG tablet Take 1 tablet (1 mg total) by mouth 2 (two) times daily. 07/05/16   Adonis Brook, NP  citalopram (CELEXA) 10 MG tablet Take 1 tablet (10 mg total) by mouth daily. 07/05/16   Adonis Brook, NP  haloperidol (HALDOL) 5 MG tablet Take 1 tablet (5 mg total) by mouth 2 (two) times daily. 07/05/16   Adonis Brook, NP    Family History Family History  Problem Relation Age of Onset  . Mental illness Other   . Mental illness Sister     Social History Social History  Substance Use Topics  . Smoking status: Current Some Day Smoker   . Smokeless tobacco: Never Used     Comment: Patient uses vapor.  . Alcohol use No     Allergies   Review of patient's allergies indicates no known allergies.   Review of Systems Review of Systems  Ten systems are reviewed and are negative for acute change except as noted in the HPI   Physical Exam Updated Vital Signs BP 111/84   Pulse 66   Temp 97.7 F (36.5 C) (Oral)   Resp 16   SpO2 97%   Physical Exam  Constitutional: He appears well-developed and well-nourished. No distress.  HENT:  Head: Normocephalic and atraumatic.  Mouth/Throat: Oropharynx is clear and moist. No oropharyngeal exudate.  Eyes: Conjunctivae are normal. Pupils are equal, round, and reactive to light. Right eye exhibits no discharge. Left eye exhibits no discharge. No scleral icterus.  Neck: No tracheal deviation present.  Cardiovascular: Normal rate, regular rhythm, normal heart sounds and intact distal pulses.  Exam reveals no gallop and no friction rub.   No murmur heard. Pulmonary/Chest: Effort normal and breath sounds normal. No respiratory distress. He has no wheezes. He has no rales. He exhibits no tenderness.  Abdominal: Soft. Bowel sounds are normal. He exhibits no distension and no mass. There is no tenderness. There is no rebound and no guarding.  Musculoskeletal: He exhibits no edema.  Lymphadenopathy:    He has no cervical adenopathy.  Neurological: He is alert. Coordination normal.  Cranial nerves 2-12  grossly intact. Normal Romberg, finger to nose, pronator drift.   Skin: Skin is warm and dry. No rash noted. He is not diaphoretic. No erythema.  Psychiatric: He has a normal mood and affect. His behavior is normal.  Nursing note and vitals reviewed.    ED Treatments / Results  Labs (all labs ordered are listed, but only abnormal results are displayed) Labs Reviewed  CBC  BASIC METABOLIC PANEL    EKG  EKG Interpretation None       Radiology Ct Head Wo Contrast  Result  Date: 06/20/2016 CLINICAL DATA:  Migraine, dizziness EXAM: CT HEAD WITHOUT CONTRAST TECHNIQUE: Contiguous axial images were obtained from the base of the skull through the vertex without intravenous contrast. COMPARISON:  02/17/2016 FINDINGS: There is no evidence of mass effect, midline shift or extra-axial fluid collections. There is no evidence of a space-occupying lesion or intracranial hemorrhage. There is no evidence of a cortical-based area of acute infarction. The ventricles and sulci are appropriate for the patient's age. The basal cisterns are patent. Visualized portions of the orbits are unremarkable. The visualized portions of the paranasal sinuses and mastoid air cells are unremarkable. The osseous structures are unremarkable. IMPRESSION: Normal CT of the brain without intravenous contrast. Electronically Signed   By: Elige KoHetal  Patel   On: 06/20/2016 20:23     Procedures Procedures (including critical care time)  Medications Ordered in ED Medications  sodium chloride 0.9 % bolus 1,000 mL (0 mLs Intravenous Stopped 06/20/16 2047)  metoCLOPramide (REGLAN) injection 10 mg (10 mg Intravenous Given 06/20/16 1953)  diphenhydrAMINE (BENADRYL) injection 25 mg (25 mg Intravenous Given 06/20/16 1954)  ketorolac (TORADOL) 30 MG/ML injection 30 mg (30 mg Intravenous Given 06/20/16 1953)     Initial Impression / Assessment and Plan / ED Course  I have reviewed the triage vital signs and the nursing notes.  Pertinent labs & imaging results that were available during my care of the patient were reviewed by me and considered in my medical decision making (see chart for details).  Clinical Course    Final Clinical Impressions(s) / ED Diagnoses   Final diagnoses:  Headache, unspecified headache type   Pt HA treated and improved while in ED.  Presentation is like pts typical HA and non concerning for [facility], ICH, Meningitis, or temporal arteritis. CT head negative. Pt is afebrile with no focal neuro  deficits, nuchal rigidity, or change in vision. Pt is to follow up with PCP to discuss prophylactic medication. Pt verbalizes understanding and is agreeable with plan to dc.   New Prescriptions Discharge Medication List as of 06/20/2016  8:30 PM       Melton KrebsSamantha Nicole Tieshia Rettinger, PA-C 07/14/16 2103    Bethann BerkshireJoseph Zammit, MD 07/22/16 1225

## 2016-08-03 ENCOUNTER — Emergency Department (HOSPITAL_COMMUNITY)
Admission: EM | Admit: 2016-08-03 | Discharge: 2016-08-03 | Disposition: A | Discharge status: Home / Self Care | Payer: Self-pay | Attending: Emergency Medicine | Admitting: Emergency Medicine

## 2016-08-03 ENCOUNTER — Encounter (HOSPITAL_COMMUNITY): Payer: Self-pay | Admitting: Emergency Medicine

## 2016-08-03 DIAGNOSIS — F17200 Nicotine dependence, unspecified, uncomplicated: Secondary | ICD-10-CM | POA: Insufficient documentation

## 2016-08-03 DIAGNOSIS — Z760 Encounter for issue of repeat prescription: Secondary | ICD-10-CM | POA: Insufficient documentation

## 2016-08-03 DIAGNOSIS — F909 Attention-deficit hyperactivity disorder, unspecified type: Secondary | ICD-10-CM | POA: Insufficient documentation

## 2016-08-03 MED ORDER — CITALOPRAM HYDROBROMIDE 10 MG PO TABS: 10.0000 mg | ORAL_TABLET | Freq: Every day | ORAL | 0 refills | Status: DC

## 2016-08-03 MED ORDER — HALOPERIDOL 5 MG PO TABS: 5.0000 mg | ORAL_TABLET | Freq: Two times a day (BID) | ORAL | 0 refills | Status: DC

## 2016-08-03 MED ORDER — BENZTROPINE MESYLATE 1 MG PO TABS: 1.0000 mg | ORAL_TABLET | Freq: Two times a day (BID) | ORAL | 0 refills | Status: DC

## 2016-08-03 NOTE — ED Notes (Signed)
Bed: WTR5 Expected date:  Expected time:  Means of arrival:  Comments: 

## 2016-08-03 NOTE — ED Triage Notes (Signed)
Pt here for a refill of cogentin, haldol, and celexa. Pt states he was a patient at [facility] in [facility]. Pt states he has an appt with a therapist in the middle of Sept but wants to get his meds refilled before then.  Pt denies SI, HI, AVH. Pt has no complaints today, just needs medications refilled

## 2016-08-03 NOTE — Discharge Instructions (Signed)
Read the information below.   I have provided a 30 day refill for your prescriptions. It is important that you follow up with your psychiatrist for further management of your medications.  Use the prescribed medication as directed.  Please discuss all new medications with your pharmacist.   You may return to the Emergency Department at any time for worsening condition or any new symptoms that concern you.

## 2016-08-03 NOTE — ED Provider Notes (Signed)
[facility]-EMERGENCY DEPT Provider Note   CSN: [id number] Arrival date & time: 08/03/16  1927   By signing my name below, I, Brad Barr, attest that this documentation has been prepared under the direction and in the presence of non-physician practitioner, Arvilla MeresAshley Javyn Havlin, PA-C.Marland Kitchen. Electronically Signed: Nelwyn SalisburyJoshua Barr, Scribe. 08/03/2016. 8:05 PM.  History   Chief Complaint Chief Complaint  Patient presents with  . Medication Refill   The history is provided by the patient. No language interpreter was used.     HPI Comments:  Brad KeyJohnny Barr is a 40 y.o. male who presents to the Emergency Department requesting a prescription refill. Patient states he is currently out of his medications. He has a scheduled appointment to see his pyschiatrist for the middle of next month. Pt denies and current nausea, vomiting, hematuria, rash, dysuria, nasal congestion, vision changes, joint aches, medication side-effects or sore throat. No SI/HI. No V/A hallucinations.    Past Medical History:  Diagnosis Date  . ADHD (attention deficit hyperactivity disorder)   . Schizophrenia (HCC)    Pt requesting this be removed.     Patient Active Problem List   Diagnosis Date Noted  . 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 Homicidal ideation   . Attention deficit hyperactivity disorder (ADHD), predominantly inattentive type   . GAD (generalized anxiety disorder) 11/07/2015  . 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 No pertinent surgical history.   Home Medications    Prior to Admission medications   Medication Sig Start Date End Date Taking? Authorizing Provider  benztropine (COGENTIN) 1 MG tablet Take 1 tablet (1 mg total) by mouth 2 (two) times daily. 08/03/16   Lona KettleAshley Laurel Rowene Suto, PA-C  citalopram (CELEXA) 10 MG tablet Take 1 tablet (10 mg total) by mouth daily. 08/03/16   Lona KettleAshley Laurel Price Lachapelle, PA-C    haloperidol (HALDOL) 5 MG tablet Take 1 tablet (5 mg total) by mouth 2 (two) times daily. 08/03/16   Lona KettleAshley Laurel Qais Jowers, PA-C    Family History Family History  Problem Relation Age of Onset  . Mental illness Other   . Mental illness Sister     Social History Social History  Substance Use Topics  . Smoking status: Current Some Day Smoker  . Smokeless tobacco: Current User     Comment: Patient uses vapor.  . Alcohol use No     Allergies   Review of patient's allergies indicates no known allergies.   Review of Systems Review of Systems  Constitutional: Negative for fever.  HENT: Negative for congestion and sore throat.   Eyes: Negative for visual disturbance.  Respiratory: Negative for shortness of breath.   Cardiovascular: Negative for chest pain.  Gastrointestinal: Negative for nausea and vomiting.  Genitourinary: Negative for dysuria and hematuria.  Musculoskeletal: Negative for arthralgias.  Skin: Negative for rash.  Neurological: Negative for syncope.  Psychiatric/Behavioral: Negative for suicidal ideas.     Physical Exam Updated Vital Signs BP 131/92 (BP Location: Left Arm)   Pulse 75   Temp 98.2 F (36.8 C) (Oral)   Resp 18   Ht 6\' 2"  (1.88 m)   Wt 102.1 kg   SpO2 97%   BMI 28.89 kg/m   Physical Exam  Constitutional: He appears well-developed and well-nourished. No distress.  HENT:  Head: Normocephalic and atraumatic.  Mouth/Throat: Oropharynx is clear and moist.  Eyes: Conjunctivae and EOM are normal. Pupils are equal, round, and reactive to light. Right eye  exhibits no discharge. Left eye exhibits no discharge. No scleral icterus.  Neck: Normal range of motion. Neck supple.  Cardiovascular: Normal rate, regular rhythm, normal heart sounds and intact distal pulses.   No murmur heard. Pulmonary/Chest: Effort normal and breath sounds normal. No respiratory distress. He has no wheezes. He has no rales.  Abdominal: Soft. Bowel sounds are normal. He  exhibits no distension and no mass. There is no tenderness.  Musculoskeletal: Normal range of motion.  Neurological: He is alert. He is not disoriented. Coordination and gait normal. GCS eye subscore is 4. GCS verbal subscore is 5. GCS motor subscore is 6.  Skin: Skin is warm and dry. He is not diaphoretic.  Psychiatric: He has a normal mood and affect. His behavior is normal. Judgment normal.  Nursing note and vitals reviewed.    ED Treatments / Results  DIAGNOSTIC STUDIES:  Oxygen Saturation is 97% on RA, normal by my interpretation.    COORDINATION OF CARE:  8:53 PM Discussed treatment plan with pt at bedside and pt agreed to plan.  Labs (all labs ordered are listed, but only abnormal results are displayed) Labs Reviewed - No data to display  EKG  EKG Interpretation None       Radiology No results found.  Procedures Procedures (including critical care time)  Medications Ordered in ED Medications - No data to display   Initial Impression / Assessment and Plan / ED Course  I have reviewed the triage vital signs and the nursing notes.  Pertinent labs & imaging results that were available during my care of the patient were reviewed by me and considered in my medical decision making (see chart for details).  Clinical Course    Pt here for refill of medication. Medication is not a controlled substance. Will refill medication here. Discussed need to keep appointment with psychiatrist for middle of month.  Return precautions given. Pt is safe for discharge at this time.   I personally performed the services described in this documentation, which was scribed in my presence. The recorded information has been reviewed and is accurate.  Final Clinical Impressions(s) / ED Diagnoses   Final diagnoses:  Encounter for medication refill    New Prescriptions Discharge Medication List as of 08/03/2016  8:57 PM         Lona Kettle, PA-C 08/03/16 1610      Benjiman Core, MD 08/03/16 [phone]

## 2016-08-04 ENCOUNTER — Telehealth: Payer: Self-pay | Admitting: *Deleted

## 2016-08-04 NOTE — Telephone Encounter (Signed)
Brad Barr was seen in the ED on 08/03/2016 for refills only. These medications are supervised by another provider and were listed on med reconciliation form during EDV. No need to alert MD.

## 2016-08-19 ENCOUNTER — Ambulatory Visit: Payer: Self-pay | Admitting: Neurology

## 2016-08-19 DIAGNOSIS — Z029 Encounter for administrative examinations, unspecified: Secondary | ICD-10-CM

## 2016-10-15 ENCOUNTER — Encounter (HOSPITAL_COMMUNITY): Payer: Self-pay | Admitting: Emergency Medicine

## 2016-10-15 ENCOUNTER — Emergency Department (HOSPITAL_COMMUNITY)
Admission: EM | Admit: 2016-10-15 | Discharge: 2016-10-16 | Disposition: A | Discharge status: Other Acute Inpatient Hospital | Payer: No Typology Code available for payment source | Attending: Emergency Medicine | Admitting: Emergency Medicine

## 2016-10-15 DIAGNOSIS — F919 Conduct disorder, unspecified: Secondary | ICD-10-CM | POA: Insufficient documentation

## 2016-10-15 DIAGNOSIS — Z79899 Other long term (current) drug therapy: Secondary | ICD-10-CM | POA: Insufficient documentation

## 2016-10-15 DIAGNOSIS — R4689 Other symptoms and signs involving appearance and behavior: Secondary | ICD-10-CM

## 2016-10-15 DIAGNOSIS — F909 Attention-deficit hyperactivity disorder, unspecified type: Secondary | ICD-10-CM | POA: Insufficient documentation

## 2016-10-15 DIAGNOSIS — F200 Paranoid schizophrenia: Secondary | ICD-10-CM | POA: Diagnosis present

## 2016-10-15 DIAGNOSIS — F17200 Nicotine dependence, unspecified, uncomplicated: Secondary | ICD-10-CM | POA: Insufficient documentation

## 2016-10-15 LAB — COMPREHENSIVE METABOLIC PANEL
ALBUMIN: 4.9 g/dL (ref 3.5–5.0)
ALT: 24 U/L (ref 17–63)
AST: 21 U/L (ref 15–41)
Alkaline Phosphatase: 59 U/L (ref 38–126)
Anion gap: 7 (ref 5–15)
BILIRUBIN TOTAL: 0.7 mg/dL (ref 0.3–1.2)
BUN: 22 mg/dL — AB (ref 6–20)
CHLORIDE: 108 mmol/L (ref 101–111)
CO2: 24 mmol/L (ref 22–32)
CREATININE: 0.63 mg/dL (ref 0.61–1.24)
Calcium: 9.7 mg/dL (ref 8.9–10.3)
GFR calc Af Amer: >60 mL/min (ref >60)
GLUCOSE: 93 mg/dL (ref 65–99)
Potassium: 4.1 mmol/L (ref 3.5–5.1)
Sodium: 139 mmol/L (ref 135–145)
Total Protein: 8.4 g/dL — ABNORMAL HIGH (ref 6.5–8.1)

## 2016-10-15 LAB — ETHANOL

## 2016-10-15 LAB — ACETAMINOPHEN LEVEL: Acetaminophen (Tylenol), Serum: <10 ug/mL — ABNORMAL LOW (ref 10–30)

## 2016-10-15 LAB — RAPID URINE DRUG SCREEN, HOSP PERFORMED
AMPHETAMINES: NOT DETECTED
BARBITURATES: NOT DETECTED
BENZODIAZEPINES: NOT DETECTED
Cocaine: NOT DETECTED
Opiates: NOT DETECTED
TETRAHYDROCANNABINOL: NOT DETECTED

## 2016-10-15 LAB — CBC
HEMATOCRIT: 43 % (ref 39.0–52.0)
Hemoglobin: 15 g/dL (ref 13.0–17.0)
MCH: 28.5 pg (ref 26.0–34.0)
MCHC: 34.9 g/dL (ref 30.0–36.0)
MCV: 81.6 fL (ref 78.0–100.0)
PLATELETS: 233 10*3/uL (ref 150–400)
RBC: 5.27 MIL/uL (ref 4.22–5.81)
RDW: 13.6 % (ref 11.5–15.5)
WBC: 9.3 10*3/uL (ref 4.0–10.5)

## 2016-10-15 LAB — SALICYLATE LEVEL: Salicylate Lvl: <7 mg/dL (ref 2.8–30.0)

## 2016-10-15 NOTE — BH Assessment (Addendum)
Tele Assessment Note   Brad Barr is an 40 y.o. male, who presents involunt[facility] and [facility]ac[facility] to [facility]. The pt initially, asked clinician if the assessment could be completed later. Clinician assured the pt that the assessment is a smooth process, the pt agreed to engage. Pt reports his family committed him because he did not take his medications today. Pt was a poor historian during the assessment, pt answered most questions with one word answers. Pt denied SI, HI, AVH and self-injurious behaviors. Pt reported experiencing the following depressive symptom: irritable mood.   Pt's mother IVC'd him. Per the IVC paperwork: "The respondent has been diagnosed as schizophrenia and depression. The respondent was prescribed Benztropine, Citalopram and Haloperidol. The respondent does not take the medication regularly. The respondent is hostile and aggressive, violent and throws objects in the home. The respondent is self mutilating by striking himself about the head, arms and chest the area. The respondent has beat himself about the face and has blackened both eyes. The respondent states that he has no control of his actions. The respondent states that drones control his desire to harm himself and his actions are sanctioned the FBI. The respondent has been [facility] to [facility] in the past and is a danger to himself and others."   Pt denied experiencing verbal, physical and sexual abuse. Pt report denied previous inpatient admissions. Pt [facility] due his mother committed him because he did not take his medication. Pt could not recall the last time he was at the hospital. Pt reported, [facility] prescribed his medication and he does not have a psychiatrist or counselor. Pt denies substance usage.   Pt presented as he was trying to got to sleep, quiet/awake in scrubs with logical/coherent speech. Pt's mood was apprehensive. Pt's affect was constricted. Pt's judgement was  parital. Pt was oriented x3 (year, city and state). Pt's concentration was fair. Pt's insight and impulse control was poor. Pt reported if discharged from the hospital he could contract for safety.   Diagnosis: Schizophrenia [facility](HCC)  Past Medical History:  Past Medical History:  Diagnosis Date  . ADHD (attention deficit hyperactivity disorder)   . Schizophrenia (HCC)    Pt requesting this be removed.     History reviewed. No pertinent surgical history.  Family History:  Family History  Problem Relation Age of Onset  . Mental illness Other   . Mental illness Sister     Social History:  reports that he has been smoking.  He uses smokeless tobacco. He reports that he does not drink alcohol or use drugs.  Additional Social History:  Alcohol / Drug Use Pain Medications: Pt denies.  Prescriptions: Pt denies.  Over the Counter: Pt denies.  History of alcohol / drug use?: No history of alcohol / drug abuse  CIWA: CIWA-Ar BP: 134/91 Pulse Rate: 76 COWS:    PATIENT STRENGTHS: (choose at least two) Average or above average intelligence Supportive family/friends  Allergies: No Known Allergies  Home Medications:  (Not in a hospital admission)  OB/GYN Status:  No LMP for male patient.  General Assessment Data Location of Assessment: [facility] TTS Assessment: In system Is this a Tele or Face-to-Face Assessment?: Face-to-Face Is this an Initial Assessment or a Re-assessment for this encounter?: Initial Assessment Marital status: Single Maiden name: NA Is patient pregnant?: No Pregnancy Status: No Living Arrangements: Other (Comment) (Pt reports with family. ) Can pt return to current living arrangement?: Yes Admission Status: Involuntary Referral Source:  Self/Family/Friend Insurance type: Self-pay     Crisis Care Plan Living Arrangements: Other (Comment) (Pt reports with family. ) Legal Guardian: Other: (Self) Name of Psychiatrist: NA Name of Therapist: NA  Education  Status Is patient currently in school?: No Current Grade: NA Highest grade of school patient has completed:  (Pt reports high school.) Name of school: NA Contact person: NA  Risk to self with the past 6 months Suicidal Ideation: No (Pt denies. ) Has patient been a risk to self within the past 6 months prior to admission? : No Suicidal Intent: No (Pt denies. ) Has patient had any suicidal intent within the past 6 months prior to admission? : No Is patient at risk for suicide?: No Suicidal Plan?: No (Pt denies. ) Has patient had any suicidal plan within the past 6 months prior to admission? : No Access to Means: No (Pt denies. ) What has been your use of drugs/alcohol within the last 12 months?: Pt denies.  Previous Attempts/Gestures: No How many times?: 0 Other Self Harm Risks: Pt denies.  Triggers for Past Attempts: None known Intentional Self Injurious Behavior:  (Pt denies, per IVC paperwork pt strikes himself on head, arm) Family Suicide History: No Recent stressful life event(s):  (UTA) Persecutory voices/beliefs?: No Depression: No Depression Symptoms: Feeling angry/irritable Substance abuse history and/or treatment for substance abuse?: No Suicide prevention information given to non-admitted patients: Not applicable  Risk to Others within the past 6 months Homicidal Ideation: No (Pt denies. ) Does patient have any lifetime risk of violence toward others beyond the six months prior to admission? : No Thoughts of Harm to Others: No Current Homicidal Intent: No (Pt denies. ) Current Homicidal Plan: No (Pt denies. ) Access to Homicidal Means: No (Pt denies. ) Identified Victim: NA History of harm to others?: No (Pt denies. ) Assessment of Violence: None Noted Violent Behavior Description: NA Does patient have access to weapons?: No Criminal Charges Pending?: No Does patient have a court date: No Is patient on probation?: No  Psychosis Hallucinations: None noted (Pt  denies. ) Delusions: None noted  Mental Status Report Appearance/Hygiene: In scrubs Eye Contact: Poor Motor Activity: Freedom of movement Speech: Logical/coherent Level of Consciousness: Quiet/awake, Other (Comment) (pt was trying to go to sleep. ) Mood: Apprehensive Affect: Constricted Anxiety Level: Minimal Thought Processes: Relevant Judgement: Partial Orientation: Other (Comment) (year, city, state) Obsessive Compulsive Thoughts/Behaviors: Unable to Assess  Cognitive Functioning Concentration: Fair Memory: Recent Impaired IQ: Average Insight: Poor Impulse Control: Poor Appetite: Good Weight Loss: 0 Weight Gain: 0 Sleep: No Change Total Hours of Sleep: 8 Vegetative Symptoms: None  ADLScreening [facility] Assessment Services) Patient's cognitive ability adequate to safely complete daily activities?: Yes Patient able to express need for assistance with ADLs?: Yes Independently performs ADLs?: Yes (appropriate for developmental age)  Prior Inpatient Therapy Prior Inpatient Therapy: Yes (Pt denies, pt IVC paperwork [facility]. ) Prior Therapy Dates:  (Unknown) Prior Therapy Facilty/Provider(s): [facility] Reason for Treatment: Schizophrenia   Prior Outpatient Therapy Prior Outpatient Therapy: No Prior Therapy Dates: NA Prior Therapy Facilty/Provider(s): NA Reason for Treatment: NA Does patient have an ACCT team?: Unknown Does patient have Intensive In-House Services?  : Unknown Does patient have [facility] services? : Unknown Does patient have P4[facility] services?: Unknown  ADL Screening (condition at time of admission) Patient's cognitive ability adequate to safely complete daily activities?: Yes Is the patient deaf or have difficulty hearing?: No Does the patient have difficulty seeing, even when wearing glasses/contacts?: Yes (Pt reports  wearing glasses. ) Does the patient have difficulty concentrating, remembering, or making decisions?: No (Pt denies. ) Patient able to express  need for assistance with ADLs?: Yes Does the patient have difficulty dressing or bathing?: No Independently performs ADLs?: Yes (appropriate for developmental age) Does the patient have difficulty walking or climbing stairs?: No Weakness of Legs: None Weakness of Arms/Hands: None       Abuse/Neglect Assessment (Assessment to be complete while patient is alone) Physical Abuse: Denies (Pt denies. ) Verbal Abuse: Denies (Pt denies. ) Sexual Abuse: Denies (Pt denies. )     Advance Directives (For Healthcare) Does patient have an advance directive?: No Would patient like information on creating an advanced directive?: No - patient declined information    Additional Information 1:1 In Past 12 Months?: No CIRT Risk: No Elopement Risk: No Does patient have medical clearance?: No     Disposition: [name], PA recommends inpatient criteria. Disposition was discussed with Britta Mccreedy, RN. TTS to seek placement.   Disposition Initial Assessment Completed for this Encounter: Yes Disposition of Patient: Other dispositions (Pending disposition) Other disposition(s): Other (Comment) (Pending. )  Gwinda Passe 10/15/2016 11:13 PM   Gwinda Passe, MS, [facility], [facility] Triage Specialist [phone]

## 2016-10-15 NOTE — ED Provider Notes (Signed)
[facility]-EMERGENCY DEPT Provider Note   CSN: [id number] Arrival date & time: 10/15/16  2031     History   Chief Complaint Chief Complaint  Patient presents with  . Aggressive Behavior  . IVC    HPI Brad Barr is a 40 y.o. male.  The history is provided by the patient and medical records. No language interpreter was used.     Brad Barr is a 40 y.o. male  with a PMH of Schizophrenia and ADHD who presents to the Emergency Department under IVC by mother for aggressive behavior. Apparently patient has been hostile at home and hitting himself the head and chest. Patient states that he is on Cogentin and Celexa, but did not take this medication today. He states this is why his mother "filled out Paperwork" and why he is in the ED today. Nursing staff states that he informed them that drones sanctioned by the [facility] were trying to control his thoughts. On my examination, patient denies this as well as auditory or visual hallucinations, suicidal thoughts, self injury or homicidal thoughts. He has no complaints at this time.  Past Medical History:  Diagnosis Date  . ADHD (attention deficit hyperactivity disorder)   . Schizophrenia (HCC)    Pt requesting this be removed.     Patient Active Problem List   Diagnosis Date Noted  . 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 Homicidal ideation   . Attention deficit hyperactivity disorder (ADHD), predominantly inattentive type   . GAD (generalized anxiety disorder) 11/07/2015  . 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 No pertinent surgical history.     Home Medications    Prior to Admission medications   Medication Sig Start Date End Date Taking? Authorizing Provider  benztropine (COGENTIN) 1 MG tablet Take 1 tablet (1 mg total) by mouth 2 (two) times daily. 08/03/16  Yes Lona KettleAshley Laurel Meyer, PA-C  citalopram (CELEXA) 10 MG tablet Take 1 tablet (10  mg total) by mouth daily. 08/03/16  Yes Lona KettleAshley Laurel Meyer, PA-C  finasteride (PROPECIA) 1 MG tablet Take 1 mg by mouth daily.   Yes Historical Provider, MD  haloperidol (HALDOL) 5 MG tablet Take 1 tablet (5 mg total) by mouth 2 (two) times daily. 08/03/16  Yes Lona KettleAshley Laurel Meyer, PA-C    Family History Family History  Problem Relation Age of Onset  . Mental illness Other   . Mental illness Sister     Social History Social History  Substance Use Topics  . Smoking status: Current Some Day Smoker  . Smokeless tobacco: Current User     Comment: Patient uses vapor.  . Alcohol use No     Allergies   Patient has no known allergies.   Review of Systems Review of Systems  Constitutional: Negative for chills and fever.  HENT: Negative for congestion.   Eyes: Negative for visual disturbance.  Respiratory: Negative for shortness of breath.   Cardiovascular: Negative for chest pain.  Gastrointestinal: Negative for abdominal pain.  Musculoskeletal: Negative for back pain.  Allergic/Immunologic: Negative for immunocompromised state.  Neurological: Negative for headaches.  Psychiatric/Behavioral: Negative for self-injury and suicidal ideas.     Physical Exam Updated Vital Signs BP 134/91 (BP Location: Left Arm)   Pulse 76   Temp 98.4 F (36.9 C) (Oral)   Resp 18   Ht 6\' 2"  (1.88 m)   Wt 97.5 kg   SpO2 96%   BMI 27.60 kg/m  Physical Exam  Constitutional: He is oriented to person, place, and time. He appears well-developed and well-nourished. No distress.  HENT:  Head: Normocephalic and atraumatic.  Cardiovascular: Normal rate, regular rhythm and normal heart sounds.   No murmur heard. Pulmonary/Chest: Effort normal and breath sounds normal. No respiratory distress. He has no wheezes. He has no rales. He exhibits no tenderness.  Abdominal: Soft. He exhibits no distension. There is no tenderness.  Neurological: He is alert and oriented to person, place, and time.  Skin:  Skin is warm and dry. No rash noted. No erythema.  Nursing note and vitals reviewed.    ED Treatments / Results  Labs (all labs ordered are listed, but only abnormal results are displayed) Labs Reviewed  COMPREHENSIVE METABOLIC PANEL - Abnormal; Notable for the following:       Result Value   BUN 22 (*)    Total Protein 8.4 (*)    All other components within normal limits  ACETAMINOPHEN LEVEL - Abnormal; Notable for the following:    Acetaminophen (Tylenol), Serum <10 (*)    All other components within normal limits  ETHANOL  SALICYLATE LEVEL  CBC  RAPID URINE DRUG SCREEN, HOSP PERFORMED    EKG  EKG Interpretation None       Radiology No results found.  Procedures Procedures (including critical care time)  Medications Ordered in ED Medications - No data to display   Initial Impression / Assessment and Plan / ED Course  I have reviewed the triage vital signs and the nursing notes.  Pertinent labs & imaging results that were available during my care of the patient were reviewed by me and considered in my medical decision making (see chart for details).  Clinical Course    Brad Barr is a 40 y.o. male who presents to ED under IVC for aggressive behavior. Benign exam. Labs reviewed and reassuring. Disposition per TTS.  Final Clinical Impressions(s) / ED Diagnoses   Final diagnoses:  None    New Prescriptions New Prescriptions   No medications on file     [facility] Hospital[facility]ime Pilcher Ward, PA-C 10/15/16 2230    Pricilla LovelessScott Goldston, MD 10/16/16 2227

## 2016-10-15 NOTE — ED Triage Notes (Signed)
Pt brought in by sheriff dept under IVC for aggressive behavior and self harm  Paperwork states pt is schizophrenic and has not been taking his medication  Pt has been hostile and aggressive  Pt has been throwing objects in his home and has been striking himself in the head, arms, and chest area  Pt has beat himself about the face and has blacked both his eyes  Pt states he has no control of his actions  Pt states that drones control his desire to harm himself and his actions are sanctioned by the FBI  [facility]   Pt IVC'd by his mother

## 2016-10-15 NOTE — ED Notes (Signed)
Pt has been seen and wanded by security.  Pt has 1 bag of belongings.  

## 2016-10-15 NOTE — ED Notes (Signed)
[facility] Safety search completed and belongings searched with no contraband found. Patient is pleasant but guarded and minimal during interaction with nurse. Patient denies SI/HI and AVH. Patient denies pain. Patient reports coming to the hospital because "my mom took out papers on me". Patient did not want to discuss more detail with this Clinical research associatewriter. Patient calm and resting in bed. Support and encouragement provided. No questions or concerns from the patient. Patient placed on Q15 minute safety checks. Environment secured, patient remains safe on the unit at this time.

## 2016-10-16 ENCOUNTER — Encounter (HOSPITAL_COMMUNITY): Payer: Self-pay

## 2016-10-16 ENCOUNTER — Inpatient Hospital Stay (HOSPITAL_COMMUNITY)
Admission: AD | Admit: 2016-10-16 | Discharge: 2016-10-19 | DRG: 897 | Disposition: A | Discharge status: Home / Self Care | Payer: Federal, State, Local not specified - Other | Attending: Psychiatry | Admitting: Psychiatry

## 2016-10-16 DIAGNOSIS — F209 Schizophrenia, unspecified: Secondary | ICD-10-CM | POA: Diagnosis present

## 2016-10-16 DIAGNOSIS — F1994 Other psychoactive substance use, unspecified with psychoactive substance-induced mood disorder: Secondary | ICD-10-CM | POA: Diagnosis present

## 2016-10-16 DIAGNOSIS — F200 Paranoid schizophrenia: Secondary | ICD-10-CM | POA: Diagnosis present

## 2016-10-16 DIAGNOSIS — Z87891 Personal history of nicotine dependence: Secondary | ICD-10-CM | POA: Diagnosis not present

## 2016-10-16 DIAGNOSIS — Z79899 Other long term (current) drug therapy: Secondary | ICD-10-CM

## 2016-10-16 DIAGNOSIS — F3289 Other specified depressive episodes: Secondary | ICD-10-CM

## 2016-10-16 DIAGNOSIS — Z9119 Patient's noncompliance with other medical treatment and regimen: Secondary | ICD-10-CM | POA: Diagnosis not present

## 2016-10-16 DIAGNOSIS — F1320 Sedative, hypnotic or anxiolytic dependence, uncomplicated: Secondary | ICD-10-CM | POA: Clinically undetermined

## 2016-10-16 DIAGNOSIS — F411 Generalized anxiety disorder: Secondary | ICD-10-CM | POA: Diagnosis present

## 2016-10-16 DIAGNOSIS — F1590 Other stimulant use, unspecified, uncomplicated: Secondary | ICD-10-CM | POA: Diagnosis present

## 2016-10-16 DIAGNOSIS — F17210 Nicotine dependence, cigarettes, uncomplicated: Secondary | ICD-10-CM

## 2016-10-16 DIAGNOSIS — Z915 Personal history of self-harm: Secondary | ICD-10-CM | POA: Diagnosis not present

## 2016-10-16 DIAGNOSIS — R45851 Suicidal ideations: Secondary | ICD-10-CM

## 2016-10-16 DIAGNOSIS — F19280 Other psychoactive substance dependence with psychoactive substance-induced anxiety disorder: Secondary | ICD-10-CM

## 2016-10-16 DIAGNOSIS — F1310 Sedative, hypnotic or anxiolytic abuse, uncomplicated: Secondary | ICD-10-CM | POA: Diagnosis present

## 2016-10-16 DIAGNOSIS — F900 Attention-deficit hyperactivity disorder, predominantly inattentive type: Secondary | ICD-10-CM | POA: Diagnosis present

## 2016-10-16 DIAGNOSIS — Z818 Family history of other mental and behavioral disorders: Secondary | ICD-10-CM

## 2016-10-16 DIAGNOSIS — F19239 Other psychoactive substance dependence with withdrawal, unspecified: Secondary | ICD-10-CM | POA: Diagnosis present

## 2016-10-16 MED ORDER — CITALOPRAM HYDROBROMIDE 20 MG PO TABS
20.0000 mg | ORAL_TABLET | Freq: Every day | ORAL | Status: DC
  Administered 2016-10-17 – 2016-10-19 (×3): 20 mg via ORAL
  Filled 2016-10-16: qty 1
  Filled 2016-10-16: qty 7
  Filled 2016-10-16: qty 1
  Filled 2016-10-16: qty 7
  Filled 2016-10-16 (×3): qty 1

## 2016-10-16 MED ORDER — HALOPERIDOL 5 MG PO TABS
5.0000 mg | ORAL_TABLET | Freq: Two times a day (BID) | ORAL | Status: DC
  Administered 2016-10-16: 5 mg via ORAL
  Filled 2016-10-16: qty 1

## 2016-10-16 MED ORDER — ALUM & MAG HYDROXIDE-SIMETH 200-200-20 MG/5ML PO SUSP: 30.0000 mL | ORAL | Status: DC | PRN

## 2016-10-16 MED ORDER — AMANTADINE HCL 100 MG PO CAPS
100.0000 mg | ORAL_CAPSULE | Freq: Two times a day (BID) | ORAL | Status: DC
  Administered 2016-10-16 – 2016-10-17 (×2): 100 mg via ORAL
  Filled 2016-10-16 (×6): qty 1

## 2016-10-16 MED ORDER — ACETAMINOPHEN 325 MG PO TABS: 650.0000 mg | ORAL_TABLET | Freq: Four times a day (QID) | ORAL | Status: DC | PRN

## 2016-10-16 MED ORDER — CITALOPRAM HYDROBROMIDE 10 MG PO TABS
20.0000 mg | ORAL_TABLET | Freq: Every day | ORAL | Status: DC
  Administered 2016-10-16: 20 mg via ORAL
  Filled 2016-10-16: qty 2

## 2016-10-16 MED ORDER — MAGNESIUM HYDROXIDE 400 MG/5ML PO SUSP: 30.0000 mL | Freq: Every day | ORAL | Status: DC | PRN

## 2016-10-16 MED ORDER — HALOPERIDOL 5 MG PO TABS
5.0000 mg | ORAL_TABLET | Freq: Two times a day (BID) | ORAL | Status: DC
  Administered 2016-10-16 – 2016-10-19 (×6): 5 mg via ORAL
  Filled 2016-10-16 (×6): qty 1
  Filled 2016-10-16 (×2): qty 14
  Filled 2016-10-16 (×2): qty 1
  Filled 2016-10-16: qty 14
  Filled 2016-10-16 (×2): qty 1
  Filled 2016-10-16: qty 14

## 2016-10-16 MED ORDER — AMANTADINE HCL 100 MG PO CAPS
100.0000 mg | ORAL_CAPSULE | Freq: Two times a day (BID) | ORAL | Status: DC
  Administered 2016-10-16: 100 mg via ORAL
  Filled 2016-10-16: qty 1

## 2016-10-16 NOTE — Tx Team (Signed)
Initial Treatment Plan 10/16/2016 6:07 PM Brad Barr [id number]    PATIENT STRESSORS: Marital or family conflict Medication change or noncompliance   PATIENT STRENGTHS: General fund of knowledge Physical Health   PATIENT IDENTIFIED PROBLEMS: Aggression  Non compliance with medications  "To go home"  "To eat/sleep better"               DISCHARGE CRITERIA:  Improved stabilization in mood, thinking, and/or behavior Verbal commitment to aftercare and medication compliance  PRELIMINARY DISCHARGE PLAN: Outpatient therapy Medication management  PATIENT/FAMILY INVOLVEMENT: This treatment plan has been presented to and reviewed with the patient, Brad KeyJohnny Barr.  The patient and family have been given the opportunity to ask questions and make suggestions.  Levin BaconHeather V Rino Hosea, RN 10/16/2016, 6:07 PM

## 2016-10-16 NOTE — ED Notes (Signed)
Pt mostly sleeping throughout the day. When asked questions regarding his admission and why he was brought here, pt denies violence or SIB. States that did not hit his head or make any attempts to injure himself. Gets up to use the bathroom, eat and take meds without incident. Denies SI/HI/AVH and contracts for safety.

## 2016-10-16 NOTE — ED Notes (Signed)
[facility]  [place] [facility] [place] [facility]  All belongings given to the transport team and signed for by the patient.

## 2016-10-16 NOTE — Consult Note (Signed)
[facility] Consult   Reason for Consult:  Suicidal and delusional Referring Physician:  EDP Patient Identification: Brad Barr MRN:  [id number] Principal Diagnosis: Paranoid schizophrenia Brad Barr) Diagnosis:   Patient Active Problem List   Diagnosis Date Noted  . Paranoid schizophrenia (Brad Barr) [F20.0] 07/02/2016    Priority: High  . 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 Homicidal ideation [R45.850]   . 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 GAD (generalized anxiety disorder) [F41.1] 11/07/2015  . 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 male patient does not know why he is here, denies issues.  HPI:  40 yo male who was IVC'd due to self harm actions and threats to kill himself, delusions of drones and the [facility].  He was beating himself and throwing things prior to admission.  Today, he denies any issues but reports he has not taken his medications and denies outpatient provider.  He lives with his parents and was on Cogentin, Celexa, and Haldol.  Patient needs inpatient hospitalization for stabilization.  Past Psychiatric History: schizophrenia, substance abuse  Risk to Self: Suicidal Ideation: No (Pt denies. ) Suicidal Intent: No (Pt denies. ) Is patient at risk for suicide?: No Suicidal Plan?: No (Pt denies. ) Access to Means: No (Pt denies. ) What has been your use of drugs/alcohol within the last 12 months?: Pt denies.  How many times?: 0 Other Self Harm Risks: Pt denies.  Triggers for Past Attempts: None known Intentional Self Injurious Behavior:  (Pt denies, per IVC paperwork pt strikes himself on head, arm) Risk to Others: Homicidal Ideation: No (Pt denies. ) Thoughts of Harm to Others: No Current Homicidal  Intent: No (Pt denies. ) Current Homicidal Plan: No (Pt denies. ) Access to Homicidal Means: No (Pt denies. ) Identified Victim: NA History of harm to others?: No (Pt denies. ) Assessment of Violence: None Noted Violent Behavior Description: NA Does patient have access to weapons?: No Criminal Charges Pending?: No Does patient have a court date: No Prior Inpatient Therapy: Prior Inpatient Therapy: Yes (Pt denies, pt IVC paperwork [facility]. ) Prior Therapy Dates:  (Unknown) Prior Therapy Facilty/Provider(s): [facility] Reason for Treatment: Schizophrenia  Prior Outpatient Therapy: Prior Outpatient Therapy: No Prior Therapy Dates: NA Prior Therapy Facilty/Provider(s): NA Reason for Treatment: NA Does patient have an ACCT team?: Unknown Does patient have Intensive In-House Services?  : Unknown Does patient have [facility] services? : Unknown Does patient have [facility] services?: Unknown  Past Medical History:  Past Medical History:  Diagnosis Date  . ADHD (attention deficit hyperactivity disorder)   . Schizophrenia ([place])    Pt requesting this be removed.    History reviewed. No pertinent surgical history. Family History:  Family History  Problem Relation Age of Onset  . Mental illness Other   . Mental illness Sister    Family Psychiatric  History:  none Social History:  History  Alcohol Use No     History  Drug Use No    Social History   Social History  . Marital status: Single    Spouse name: N/A  . Number of children: N/A  . Years of education: N/A   Social History Main Topics  . Smoking status: Current Some Day Smoker  . Smokeless tobacco: Current User  Comment: Patient uses vapor.  . Alcohol use No  . Drug use: No  . Sexual activity: No   Other Topics Concern  . None   Social History Narrative  . None   Additional Social History:    Allergies:  No Known Allergies  Labs:  Results for orders placed or performed during the Barr encounter of 10/15/16  (from the past 48 hour(s))  Rapid urine drug screen (Barr performed)     Status: None   Collection Time: 10/15/16  8:42 PM  Result Value Ref Range   Opiates NONE DETECTED NONE DETECTED   Cocaine NONE DETECTED NONE DETECTED   Benzodiazepines NONE DETECTED NONE DETECTED   Amphetamines NONE DETECTED NONE DETECTED   Tetrahydrocannabinol NONE DETECTED NONE DETECTED   Barbiturates NONE DETECTED NONE DETECTED    Comment:        DRUG SCREEN FOR MEDICAL PURPOSES ONLY.  IF CONFIRMATION IS NEEDED FOR ANY PURPOSE, NOTIFY LAB WITHIN 5 DAYS.        LOWEST DETECTABLE LIMITS FOR URINE DRUG SCREEN Drug Class       Cutoff (ng/mL) Amphetamine      1000 Barbiturate      200 Benzodiazepine   056 Tricyclics       979 Opiates          300 Cocaine          300 THC              50   Comprehensive metabolic panel     Status: Abnormal   Collection Time: 10/15/16  9:09 PM  Result Value Ref Range   Sodium 139 135 - 145 mmol/L   Potassium 4.1 3.5 - 5.1 mmol/L   Chloride 108 101 - 111 mmol/L   CO2 24 22 - 32 mmol/L   Glucose, Bld 93 65 - 99 mg/dL   BUN 22 (H) 6 - 20 mg/dL   Creatinine, Ser 0.63 0.61 - 1.24 mg/dL   Calcium 9.7 8.9 - 10.3 mg/dL   Total Protein 8.4 (H) 6.5 - 8.1 g/dL   Albumin 4.9 3.5 - 5.0 g/dL   AST 21 15 - 41 U/L   ALT 24 17 - 63 U/L   Alkaline Phosphatase 59 38 - 126 U/L   Total Bilirubin 0.7 0.3 - 1.2 mg/dL   GFR calc non Af Amer >60 >60 mL/min   GFR calc Af Amer >60 >60 mL/min    Comment: (NOTE) The eGFR has been calculated using the CKD EPI equation. This calculation has not been validated in all clinical situations. eGFR's persistently <60 mL/min signify possible Chronic Kidney Disease.    Anion gap 7 5 - 15  Ethanol     Status: None   Collection Time: 10/15/16  9:09 PM  Result Value Ref Range   Alcohol, Ethyl (B) <5 <5 mg/dL    Comment:        LOWEST DETECTABLE LIMIT FOR SERUM ALCOHOL IS 5 mg/dL FOR MEDICAL PURPOSES ONLY   Salicylate level     Status: None    Collection Time: 10/15/16  9:09 PM  Result Value Ref Range   Salicylate Lvl <4.8 2.8 - 30.0 mg/dL  Acetaminophen level     Status: Abnormal   Collection Time: 10/15/16  9:09 PM  Result Value Ref Range   Acetaminophen (Tylenol), Serum <10 (L) 10 - 30 ug/mL    Comment:        THERAPEUTIC CONCENTRATIONS VARY SIGNIFICANTLY. A RANGE OF 10-30 ug/mL MAY BE  AN EFFECTIVE CONCENTRATION FOR MANY PATIENTS. HOWEVER, SOME ARE BEST TREATED AT CONCENTRATIONS OUTSIDE THIS RANGE. ACETAMINOPHEN CONCENTRATIONS >150 ug/mL AT 4 HOURS AFTER INGESTION AND >50 ug/mL AT 12 HOURS AFTER INGESTION ARE OFTEN ASSOCIATED WITH TOXIC REACTIONS.   cbc     Status: None   Collection Time: 10/15/16  9:09 PM  Result Value Ref Range   WBC 9.3 4.0 - 10.5 K/uL   RBC 5.27 4.22 - 5.81 MIL/uL   Hemoglobin 15.0 13.0 - 17.0 g/dL   HCT 43.0 39.0 - 52.0 %   MCV 81.6 78.0 - 100.0 fL   MCH 28.5 26.0 - 34.0 pg   MCHC 34.9 30.0 - 36.0 g/dL   RDW 13.6 11.5 - 15.5 %   Platelets 233 150 - 400 K/uL    Current Facility-Administered Medications  Medication Dose Route Frequency Provider Last Rate Last Dose  . amantadine (SYMMETREL) capsule 100 mg  100 mg Oral BID Corena Pilgrim, MD   100 mg at 10/16/16 1120  . citalopram (CELEXA) tablet 20 mg  20 mg Oral Daily Mamoudou Mulvehill, MD   20 mg at 10/16/16 1119  . haloperidol (HALDOL) tablet 5 mg  5 mg Oral BID Corena Pilgrim, MD   5 mg at 10/16/16 1120   Current Outpatient Prescriptions  Medication Sig Dispense Refill  . benztropine (COGENTIN) 1 MG tablet Take 1 tablet (1 mg total) by mouth 2 (two) times daily. 60 tablet 0  . citalopram (CELEXA) 10 MG tablet Take 1 tablet (10 mg total) by mouth daily. 30 tablet 0  . finasteride (PROPECIA) 1 MG tablet Take 1 mg by mouth daily.    . haloperidol (HALDOL) 5 MG tablet Take 1 tablet (5 mg total) by mouth 2 (two) times daily. 60 tablet 0    Musculoskeletal: Strength & Muscle Tone: within normal limits Gait & Station: normal Patient  leans: N/A  Psychiatric Specialty Exam: Physical Exam  Constitutional: He is oriented to person, place, and time. He appears well-developed and well-nourished.  HENT:  Head: Normocephalic.  Neck: Normal range of motion.  Respiratory: Effort normal.  Musculoskeletal: Normal range of motion.  Neurological: He is alert and oriented to person, place, and time.  Skin: Skin is warm and dry.  Psychiatric: Thought content is paranoid and delusional. He exhibits a depressed mood. He expresses suicidal ideation. He expresses suicidal plans.    Review of Systems  Constitutional: Negative.   HENT: Negative.   Eyes: Negative.   Respiratory: Negative.   Cardiovascular: Negative.   Gastrointestinal: Negative.   Genitourinary: Negative.   Musculoskeletal: Negative.   Skin: Negative.   Neurological: Negative.   Endo/Heme/Allergies: Negative.     Blood pressure 127/78, pulse 71, temperature 98.6 F (37 C), temperature source Oral, resp. rate 16, height _0  (1.88 m), weight 97.5 kg (215 lb), SpO2 99 %.Body mass index is 27.6 kg/m.  General Appearance: Disheveled  Eye Contact:  Fair  Speech:  Normal Rate  Volume:  Normal  Mood:  Anxious  Affect:  Blunt  Thought Process:  Coherent and Descriptions of Associations: Intact  Orientation:  Full (Time, Place, and Person)  Thought Content:  Delusions and Paranoid Ideation  Suicidal Thoughts:  Yes.  with intent/plan  Homicidal Thoughts:  No  Memory:  Immediate;   Fair Recent;   Poor Remote;   Fair  Judgement:  Impaired  Insight:  Lacking  Psychomotor Activity:  Normal  Concentration:  Concentration: Fair and Attention Span: Fair  Recall:  [employer] of  Knowledge:  Fair  Language:  Good  Akathisia:  No  Handed:  Right  AIMS (if indicated):     Assets:  Housing Leisure Time Physical Health Resilience Social Support  ADL's:  Intact  Cognition:  Impaired,  Mild  Sleep:        Treatment Plan Summary: Daily contact with patient to  assess and evaluate symptoms and progress in treatment, Medication management and Plan paranoid schizophrenia:  -Crisis stabilization -Medication management:  Started Haldol 5 mg BID for psychosis, Symmetrel 100 mg BID for EPS, and Celexa 20 mg daily for depression -Individual counseling  Disposition: Recommend psychiatric Inpatient admission when medically cleared.  Waylan Boga, NP 10/16/2016 1:40 PM  Patient seen face-to-face for psychiatric evaluation, chart reviewed and case discussed with the physician extender and developed treatment plan. Reviewed the information documented and agree with the treatment plan. Corena Pilgrim, MD

## 2016-10-16 NOTE — Progress Notes (Signed)
10/16/16 1354:  Pt was sleep when LRT went to pt room.  Caroll RancherMarjette Ladaja Yusupov, LRT/CTRS

## 2016-10-16 NOTE — Progress Notes (Signed)
Entered in d/c instructions Please use the resources provided to you in emergency room by case manager to assist you're your choice of doctor for follow up  These [place] uninsured resources provide possible primary care providers, resources for discounted medications, housing, dental resources, affordable care act information, plus other resources for [place]   A referral for you has been sent to [facility] if you have not received a call in 3 days you may contact them Call Karen Andrianos at [phone] Tuesday-Friday [email or web address] 

## 2016-10-16 NOTE — BH Assessment (Signed)
[name]  Per Thedore MinsMojeed Akintayo, MD, this pt requires psychiatric hospitalization.  Brad Heinrichina Tate, RN, [id number].  Pt presents under IVC initiated by pt's mother, and upheld by Dr Jannifer FranklinAkintayo, and [facility].  Pt's nurse, Rudean HittDawnaly, has been notified, and agrees to call report to [phone].  Pt is to be transported via [employer] enforcement.   Brad Canninghomas Emerald Shor, MA Triage Specialist [phone]

## 2016-10-16 NOTE — Progress Notes (Signed)
Brad Barr is a 40 year old male being admitted involuntarily to [facility]-2 from [facility].  He was IVC'd by his family after he didn't take his medication.  The IVC paperwork stated pt had been diagnosed with schizophrenia and depression.  He does not take his medication regularly.  He was hostile, aggressive, violent (throwing objects in the home).  He was self-mutilating by striking himself about the head, arms and chest area.  He had blackened both eyes.  He had no control over his actions.  He reported drones control his desire to harm himself and his actions are sanctioned by the [facility].  He denied SI/HI or A/V hallucinations.  He denies any medical issues and appears to be in no physical distress.  He denies SI/HI or A/V hallucinations.  When asked questions, he answered "every thing is fine, I don't have any symptoms."  He did stated that he stopped taking his medications but denies any history of violence.  Oriented him to the unit.  Admission paperwork completed and signed.  Belongings searched and secured in locker # 5.  Skin assessment completed and no skin issues noted.  Q 15 minute checks initiated for safety.  We will monitor the progress towards his goals.

## 2016-10-16 NOTE — Progress Notes (Signed)
CM spoke with pt who confirms uninsured [place] resident with no pcp.  CM discussed and provided written information to assist pt with determining choice for uninsured accepting pcps, discussed the importance of pcp vs EDP servicesHess Corporation for f/u care, www.needymeds.org, www.goodrx.com, discounted pharmacies and other [place] resources such as [facility] , [facility], affordable care act, financial assistance, uninsured dental services, [place] med assist, DSS and  health department  Reviewed resources for [place] uninsured accepting pcps like [facility], [facility] medicine [facility] [address], [facility], [facility], local urgent care centers, [facility], [facility], general medical clinics, [facility], [facility] plus others, medication resources, [facility] out patient pharmacies and housing Pt voiced understanding and appreciation of resources provided   Provided [facility] contact information Pt agreed to a referral Cm completed referral Pt to be contact by [facility] clinical liaison  ED CM left pt uninsured [place] resources in his locker #35

## 2016-10-17 ENCOUNTER — Encounter (HOSPITAL_COMMUNITY): Payer: Self-pay | Admitting: Psychiatry

## 2016-10-17 DIAGNOSIS — F1994 Other psychoactive substance use, unspecified with psychoactive substance-induced mood disorder: Secondary | ICD-10-CM | POA: Clinically undetermined

## 2016-10-17 DIAGNOSIS — F1320 Sedative, hypnotic or anxiolytic dependence, uncomplicated: Secondary | ICD-10-CM | POA: Clinically undetermined

## 2016-10-17 DIAGNOSIS — Z79899 Other long term (current) drug therapy: Secondary | ICD-10-CM

## 2016-10-17 DIAGNOSIS — F19280 Other psychoactive substance dependence with psychoactive substance-induced anxiety disorder: Secondary | ICD-10-CM

## 2016-10-17 DIAGNOSIS — F19239 Other psychoactive substance dependence with withdrawal, unspecified: Secondary | ICD-10-CM | POA: Diagnosis present

## 2016-10-17 MED ORDER — OLANZAPINE 10 MG IM SOLR
5.0000 mg | Freq: Three times a day (TID) | INTRAMUSCULAR | Status: DC | PRN
Start: 2016-10-17 — End: 2016-10-19

## 2016-10-17 MED ORDER — BENZTROPINE MESYLATE 1 MG PO TABS
1.0000 mg | ORAL_TABLET | Freq: Two times a day (BID) | ORAL | Status: DC
  Administered 2016-10-17 – 2016-10-19 (×4): 1 mg via ORAL
  Filled 2016-10-17 (×2): qty 1
  Filled 2016-10-17: qty 14
  Filled 2016-10-17: qty 1
  Filled 2016-10-17: qty 14
  Filled 2016-10-17: qty 1
  Filled 2016-10-17: qty 14
  Filled 2016-10-17: qty 1
  Filled 2016-10-17: qty 14
  Filled 2016-10-17: qty 1

## 2016-10-17 MED ORDER — HYDROXYZINE HCL 25 MG PO TABS
25.0000 mg | ORAL_TABLET | Freq: Four times a day (QID) | ORAL | Status: DC | PRN
  Administered 2016-10-18: 25 mg via ORAL
  Filled 2016-10-17: qty 1
  Filled 2016-10-17: qty 10

## 2016-10-17 MED ORDER — OLANZAPINE 5 MG PO TABS: 5.0000 mg | ORAL_TABLET | Freq: Three times a day (TID) | ORAL | Status: DC | PRN

## 2016-10-17 NOTE — H&P (Signed)
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 GAD (generalized anxiety disorder) [F41.1] 11/07/2015  . 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  Per initial notes in EHR : ' Kwamaine Mielke is an 40 y.o. male, who presents involuntarily and unaccompanied to [facility]. Pt's mother IVC'd him. Per the IVC paperwork: "The respondent has been diagnosed as schizophrenia and depression. The respondent was prescribed Benztropine, Citalopram and Haloperidol. The respondent does not take the medication regularly. The respondent is hostile and aggressive, violent and throws objects in the home. The respondent is self mutilating by striking himself about the head, arms and chest the area. The respondent has beat himself about the face and has blackened both eyes. The respondent states that he has no control of his actions. The respondent states that drones control his desire to harm himself and his actions are sanctioned the [employer]. The respondent has been committed to [facility] in the past and is a danger to himself and others."   Patient seen and chart reviewed TODAY .Discussed patient with treatment team. Pt today is seen as calm, cooperative , he attempts  to minimize the events that led to his hospitalization. Pt states he does not know what happened , " Its my parents.' Pt reports anxiety , does not elaborate. Pt denies any other concerns . Pt appears guarded and appears to be a limited historian - hence majority of the information obtained from EHR as noted above.  Collateral information was obtained from parents - called mother Damiel Barthold - got return call from parents - per father patient currently lives with him and his wife and he has been closely supervising his medications and giving it daily. However , pt continues to cheek it or spit it out. When he takes his medications he is good , otherwise he can get very impulsive, aggressive . Per father , patient's drug addict sister gave him , most likely some xanax pills few days ago and patient has been abusing it . Pt took it few days ago, don't know how many pills he took , he was asleep for 3 days , he woke up and then started acting out , was throwing things , was threatening to hurt family and was verbally abusive towards mother. Hence parents IVced him since they felt he needed to be in a safe setting. Father would like patient to go to a substance abuse rehab from here . It was discussed with him that it can only be done with patient's consent."    Associated Signs/Symptoms: Depression Symptoms:  fatigue, (Hypo) Manic Symptoms:  Impulsivity, was delusional on admission - currently denies it Anxiety Symptoms:  Excessive Worry, Panic Symptoms, Psychotic Symptoms:  Delusions, Paranoia, on admission PTSD Symptoms: Negative Total Time spent with patient: 45 minutes  Past Psychiatric History: Hx  of several admissions to [facility] for substance induced mood sx as well as substance abuse. Pt is noncompliant with out patient care and medications.Pt denies any suicide attempts Is the patient at risk to self? Yes.    Has the patient been a risk to self in the past 6 months? Yes.    Has the patient been  a risk to self within the distant past? Yes.    Is the patient a risk to others? Yes.    Has the patient been a risk to others in the past 6 months? Yes.    Has the patient been a risk to others within the distant past? Yes.     Prior Inpatient Therapy:   Prior Outpatient Therapy:    Alcohol Screening: 1. How often do you have a drink containing alcohol?: Never 9. Have you or someone else been injured as a result of your drinking?: No 10. Has a relative or friend or a doctor or another health worker been concerned about your drinking or suggested you cut down?: No Alcohol Use Disorder Identification Test Final Score (AUDIT): 0 Brief Intervention: AUDIT score less than 7 or less-screening does not suggest unhealthy drinking-brief intervention not indicated Substance Abuse History in the last 12 months:  Yes.   Consequences of Substance Abuse: Family Consequences:  relational issues Withdrawal Symptoms:   acting out, anxiety Previous Psychotropic Medications: Yes adderall, xanax  Psychological Evaluations: No  Past Medical History:  Past Medical History:  Diagnosis Date  . ADHD (attention deficit hyperactivity disorder)   . Schizophrenia ([place])    Pt requesting this be removed.    History reviewed. No pertinent surgical history. Family History: denies hx of HTN, DM Family History  Problem Relation Age of Onset  . Mental illness Other   . Mental illness Sister    Family Psychiatric  History: Pls see above, sister also abuses drugs Tobacco Screening: Have you used any form of tobacco in the last 30 days? (Cigarettes, Smokeless Tobacco, Cigars, and/or Pipes): No Social History:  single, lives with parents who are supportive. History  Alcohol Use No     History  Drug Use No    Additional Social History:      Pain Medications: Pt denies.  Prescriptions: Pt denies.  Over the Counter: Pt denies.  History of alcohol / drug use?: No history of alcohol / drug abuse Longest period  of sobriety (when/how long): denies current usage Negative Consequences of Use: Personal relationships                    Allergies:  No Known Allergies Lab Results:  Results for orders placed or performed during the hospital encounter of 10/15/16 (from the past 48 hour(s))  Rapid urine drug screen (hospital performed)     Status: None   Collection Time: 10/15/16  8:42 PM  Result Value Ref Range   Opiates NONE DETECTED NONE DETECTED   Cocaine NONE DETECTED NONE DETECTED   Benzodiazepines NONE DETECTED NONE DETECTED   Amphetamines NONE DETECTED NONE DETECTED   Tetrahydrocannabinol NONE DETECTED NONE DETECTED   Barbiturates NONE DETECTED NONE DETECTED    Comment:        DRUG SCREEN FOR MEDICAL PURPOSES ONLY.  IF CONFIRMATION IS NEEDED FOR ANY PURPOSE, NOTIFY LAB WITHIN 5 DAYS.        LOWEST DETECTABLE LIMITS FOR URINE DRUG SCREEN Drug Class       Cutoff (ng/mL) Amphetamine      1000 Barbiturate  200 Benzodiazepine   032 Tricyclics       122 Opiates          300 Cocaine          300 THC              50   Comprehensive metabolic panel     Status: Abnormal   Collection Time: 10/15/16  9:09 PM  Result Value Ref Range   Sodium 139 135 - 145 mmol/L   Potassium 4.1 3.5 - 5.1 mmol/L   Chloride 108 101 - 111 mmol/L   CO2 24 22 - 32 mmol/L   Glucose, Bld 93 65 - 99 mg/dL   BUN 22 (H) 6 - 20 mg/dL   Creatinine, Ser 0.63 0.61 - 1.24 mg/dL   Calcium 9.7 8.9 - 10.3 mg/dL   Total Protein 8.4 (H) 6.5 - 8.1 g/dL   Albumin 4.9 3.5 - 5.0 g/dL   AST 21 15 - 41 U/L   ALT 24 17 - 63 U/L   Alkaline Phosphatase 59 38 - 126 U/L   Total Bilirubin 0.7 0.3 - 1.2 mg/dL   GFR calc non Af Amer >60 >60 mL/min   GFR calc Af Amer >60 >60 mL/min    Comment: (NOTE) The eGFR has been calculated using the CKD EPI equation. This calculation has not been validated in all clinical situations. eGFR's persistently <60 mL/min signify possible Chronic Kidney Disease.    Anion gap 7 5 - 15   Ethanol     Status: None   Collection Time: 10/15/16  9:09 PM  Result Value Ref Range   Alcohol, Ethyl (B) <5 <5 mg/dL    Comment:        LOWEST DETECTABLE LIMIT FOR SERUM ALCOHOL IS 5 mg/dL FOR MEDICAL PURPOSES ONLY   Salicylate level     Status: None   Collection Time: 10/15/16  9:09 PM  Result Value Ref Range   Salicylate Lvl <4.8 2.8 - 30.0 mg/dL  Acetaminophen level     Status: Abnormal   Collection Time: 10/15/16  9:09 PM  Result Value Ref Range   Acetaminophen (Tylenol), Serum <10 (L) 10 - 30 ug/mL    Comment:        THERAPEUTIC CONCENTRATIONS VARY SIGNIFICANTLY. A RANGE OF 10-30 ug/mL MAY BE AN EFFECTIVE CONCENTRATION FOR MANY PATIENTS. HOWEVER, SOME ARE BEST TREATED AT CONCENTRATIONS OUTSIDE THIS RANGE. ACETAMINOPHEN CONCENTRATIONS >150 ug/mL AT 4 HOURS AFTER INGESTION AND >50 ug/mL AT 12 HOURS AFTER INGESTION ARE OFTEN ASSOCIATED WITH TOXIC REACTIONS.   cbc     Status: None   Collection Time: 10/15/16  9:09 PM  Result Value Ref Range   WBC 9.3 4.0 - 10.5 K/uL   RBC 5.27 4.22 - 5.81 MIL/uL   Hemoglobin 15.0 13.0 - 17.0 g/dL   HCT 43.0 39.0 - 52.0 %   MCV 81.6 78.0 - 100.0 fL   MCH 28.5 26.0 - 34.0 pg   MCHC 34.9 30.0 - 36.0 g/dL   RDW 13.6 11.5 - 15.5 %   Platelets 233 150 - 400 K/uL    Blood Alcohol level:  Lab Results  Component Value Date   [place] <5 10/15/2016   ETH <5 25/00/3704    Metabolic Disorder Labs:  Lab Results  Component Value Date   HGBA1C 5.5 11/07/2015   MPG 111 11/07/2015   Lab Results  Component Value Date   PROLACTIN 79.3 (H) 02/23/2016   PROLACTIN 63.1 (H) 11/07/2015   Lab Results  Component Value Date   CHOL 208 (H) 11/07/2015   TRIG 181 (H) 11/07/2015   HDL 56 11/07/2015   CHOLHDL 3.7 11/07/2015   VLDL 36 11/07/2015   LDLCALC 116 (H) 11/07/2015    Current Medications: Current Facility-Administered Medications  Medication Dose Route Frequency Provider Last Rate Last Dose  . acetaminophen (TYLENOL) tablet 650 mg   650 mg Oral Q6H PRN Patrecia Pour, NP      . alum & mag hydroxide-simeth (MAALOX/MYLANTA) 200-200-20 MG/5ML suspension 30 mL  30 mL Oral Q4H PRN Patrecia Pour, NP      . benztropine (COGENTIN) tablet 1 mg  1 mg Oral BID Ursula Alert, MD      . citalopram (CELEXA) tablet 20 mg  20 mg Oral Daily Patrecia Pour, NP   20 mg at 10/17/16 4403  . haloperidol (HALDOL) tablet 5 mg  5 mg Oral BID Patrecia Pour, NP   5 mg at 10/17/16 4742  . hydrOXYzine (ATARAX/VISTARIL) tablet 25 mg  25 mg Oral Q6H PRN Ursula Alert, MD      . magnesium hydroxide (MILK OF MAGNESIA) suspension 30 mL  30 mL Oral Daily PRN Patrecia Pour, NP      . OLANZapine (ZYPREXA) tablet 5 mg  5 mg Oral TID PRN Ursula Alert, MD       Or  . OLANZapine (ZYPREXA) injection 5 mg  5 mg Intramuscular TID PRN Ursula Alert, MD       PTA Medications: Prescriptions Prior to Admission  Medication Sig Dispense Refill Last Dose  . benztropine (COGENTIN) 1 MG tablet Take 1 tablet (1 mg total) by mouth 2 (two) times daily. 60 tablet 0 10/14/2016 at Unknown time  . citalopram (CELEXA) 10 MG tablet Take 1 tablet (10 mg total) by mouth daily. 30 tablet 0 10/14/2016 at Unknown time  . finasteride (PROPECIA) 1 MG tablet Take 1 mg by mouth daily.   10/14/2016 at Unknown time  . haloperidol (HALDOL) 5 MG tablet Take 1 tablet (5 mg total) by mouth 2 (two) times daily. 60 tablet 0 10/14/2016 at Unknown time    Musculoskeletal: Strength & Muscle Tone: within normal limits Gait & Station: normal Patient leans: N/A  Psychiatric Specialty Exam: Physical Exam  Nursing note and vitals reviewed. Constitutional:  I concur with PE done in ED.    Review of Systems  Psychiatric/Behavioral: The patient is nervous/anxious.   All other systems reviewed and are negative.   Blood pressure 133/78, pulse 68, temperature 98.3 F (36.8 C), temperature source Oral, resp. rate 18, height 6' 2"  (1.88 m), weight 97.5 kg (215 lb), SpO2 99 %.Body mass index is  27.6 kg/m.  General Appearance: Guarded  Eye Contact:  Fair  Speech:  Normal Rate  Volume:  Decreased  Mood:  Anxious  Affect:  Congruent  Thought Process:  Goal Directed and Descriptions of Associations: Intact  Orientation:  Full (Time, Place, and Person)  Thought Content:  Delusions and Paranoid Ideation on admission  Suicidal Thoughts:  No  Homicidal Thoughts:  No  Memory:  Immediate;   Fair Recent;   Fair Remote;   Fair  Judgement:  Impaired  Insight:  Shallow  Psychomotor Activity:  Restlessness  Concentration:  Concentration: Fair and Attention Span: Fair  Recall:  [employer] of Knowledge:  Fair  Language:  Fair  Akathisia:  No  Handed:  Right  AIMS (if indicated):     Assets:  Desire for Improvement  ADL's:  Intact  Cognition:  WNL  Sleep:  Number of Hours: 6.75    Treatment Plan Summary:Jered Nunnis a 40 y.o.caucasian male,single , lives in [place] with his parents ,  who presented under IVC, taken out by his mother  For worsening aggressive behavior and delusions.   Patient today seen as very limited historian and hence collateral information obtained from family - please see above. Pt was aggressive , delusional and threatening at home - will observe on the unit.  Will continue today 10/17/16 plan as below except where it is noted.  Daily contact with patient to assess and evaluate symptoms and progress in treatment and Medication management Patient will benefit from inpatient treatment and stabilization.  Estimated length of stay is 5-7 days.  Reviewed past medical records,treatment plan.  Will restart celexa 10 mg po daily for affective sx. Will restart Haldol 5 mg po bid for mood sx/psychosis. Will restart Cogentin 1 mg po bid for eps. Will make available prn medications as per agitation protocol. Will continue to monitor vitals ,medication compliance and treatment side effects while patient is here.  Will monitor for medical issues as well as call consult  as needed.  Reviewed labs uds- negative , cbc- wnl, cmp -wnl,will get lipid panel , hba1c, pl , tsh as well as EKG for qtc. CSW will start working on disposition. Pt to be referred to a substance abuse treatment program if he is motivated to do so. Patient to participate in therapeutic milieu .     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    Nautia Lem, MD 11/9/201712:03 PM

## 2016-10-17 NOTE — BHH Counselor (Signed)
CSW intern attempted to complete PSA with patient, however patient declined at this time. CSW intern introduced herself, and explained assessment process, patient stated "No thanks, Im good". CSW will continue to follow and attempt assessment at a later time.

## 2016-10-17 NOTE — Tx Team (Signed)
Interdisciplinary Treatment and Diagnostic Plan Update  10/17/2016 Time of Session: 10:23 AM  Brad Barr MRN: [id number]  Principal Diagnosis: Substance or medication-induced bipolar and related disorder (HCC)  Secondary Diagnoses: Principal Problem:   Substance or medication-induced bipolar and related disorder (HCC) Active Problems:   Stimulant use disorder   Mild benzodiazepine use disorder   GAD (generalized anxiety disorder)   Attention deficit hyperactivity disorder (ADHD), predominantly inattentive type   Current Medications:  Current Facility-Administered Medications  Medication Dose Route Frequency Provider Last Rate Last Dose  . acetaminophen (TYLENOL) tablet 650 mg  650 mg Oral Q6H PRN Charm RingsJamison Y Lord, NP      . alum & mag hydroxide-simeth (MAALOX/MYLANTA) 200-200-20 MG/5ML suspension 30 mL  30 mL Oral Q4H PRN Charm RingsJamison Y Lord, NP      . amantadine (SYMMETREL) capsule 100 mg  100 mg Oral BID Charm RingsJamison Y Lord, NP   100 mg at 10/17/16 0824  . citalopram (CELEXA) tablet 20 mg  20 mg Oral Daily Charm RingsJamison Y Lord, NP   20 mg at 10/17/16 0824  . haloperidol (HALDOL) tablet 5 mg  5 mg Oral BID Charm RingsJamison Y Lord, NP   5 mg at 10/17/16 0824  . magnesium hydroxide (MILK OF MAGNESIA) suspension 30 mL  30 mL Oral Daily PRN Charm RingsJamison Y Lord, NP        PTA Medications: Prescriptions Prior to Admission  Medication Sig Dispense Refill Last Dose  . benztropine (COGENTIN) 1 MG tablet Take 1 tablet (1 mg total) by mouth 2 (two) times daily. 60 tablet 0 10/14/2016 at Unknown time  . citalopram (CELEXA) 10 MG tablet Take 1 tablet (10 mg total) by mouth daily. 30 tablet 0 10/14/2016 at Unknown time  . finasteride (PROPECIA) 1 MG tablet Take 1 mg by mouth daily.   10/14/2016 at Unknown time  . haloperidol (HALDOL) 5 MG tablet Take 1 tablet (5 mg total) by mouth 2 (two) times daily. 60 tablet 0 10/14/2016 at Unknown time    Treatment Modalities: Medication Management, Group therapy, Case management,  1 to 1  session with clinician, Psychoeducation, Recreational therapy.   Physician Treatment Plan for Primary Diagnosis: Substance or medication-induced bipolar and related disorder (HCC) Long Term Goal(s): Improvement in symptoms so as ready for discharge  Short Term Goals: Ability to identify triggers associated with substance abuse/mental health issues will improve  Medication Management: Evaluate patient's response, side effects, and tolerance of medication regimen.  Therapeutic Interventions: 1 to 1 sessions, Unit Group sessions and Medication administration.  Evaluation of Outcomes: Adequate for Discharge  Physician Treatment Plan for Secondary Diagnosis: Principal Problem:   Substance or medication-induced bipolar and related disorder (HCC) Active Problems:   Stimulant use disorder   Mild benzodiazepine use disorder   GAD (generalized anxiety disorder)   Attention deficit hyperactivity disorder (ADHD), predominantly inattentive type   Long Term Goal(s): Improvement in symptoms so as ready for discharge  Short Term Goals: Compliance with prescribed medications will improve  Medication Management: Evaluate patient's response, side effects, and tolerance of medication regimen.  Therapeutic Interventions: 1 to 1 sessions, Unit Group sessions and Medication administration.  Evaluation of Outcomes: Adequate for Discharge   RN Treatment Plan for Primary Diagnosis: Substance or medication-induced bipolar and related disorder (HCC) Long Term Goal(s): Knowledge of disease and therapeutic regimen to maintain health will improve  Short Term Goals: Ability to remain free from injury will improve and Compliance with prescribed medications will improve  Medication Management: RN will administer medications as  ordered by provider, will assess and evaluate patient's response and provide education to patient for prescribed medication. RN will report any adverse and/or side effects to prescribing  provider.  Therapeutic Interventions: 1 on 1 counseling sessions, Psychoeducation, Medication administration, Evaluate responses to treatment, Monitor vital signs and CBGs as ordered, Perform/monitor CIWA, COWS, AIMS and Fall Risk screenings as ordered, Perform wound care treatments as ordered.  Evaluation of Outcomes: Adequate for Discharge   LCSW Treatment Plan for Primary Diagnosis: Substance or medication-induced bipolar and related disorder (HCC) Long Term Goal(s): Safe transition to appropriate next level of care at discharge, Engage patient in therapeutic group addressing interpersonal concerns.  Short Term Goals: Engage patient in aftercare planning with referrals and resources and Facilitate acceptance of mental health diagnosis and concerns  Therapeutic Interventions: Assess for all discharge needs, 1 to 1 time with Social worker, Explore available resources and support systems, Assess for adequacy in community support network, Educate family and significant other(s) on suicide prevention, Complete Psychosocial Assessment, Interpersonal group therapy.  Evaluation of Outcomes: Adequate for Discharge   Progress in Treatment: Attending groups: Yes, starting 11/10 Participating in groups: Yes Taking medication as prescribed: Yes, MD continues to assess for medication changes as needed Toleration medication: Yes, no side effects reported at this time Family/Significant other contact made: Patient refused  Patient understands diagnosis: Limited insight "I know what was different this time, and I know what I need to do to avoid it in the future." Discussing patient identified problems/goals with staff: Yes Medical problems stabilized or resolved: Yes Denies suicidal/homicidal ideation: Yes Issues/concerns per patient self-inventory: None Other: N/A  New problem(s) identified: None identified at this time.   New Short Term/Long Term Goal(s): None identified at this time.    Discharge Plan or Barriers: Return home, follow up outpatient   Reason for Continuation of Hospitalization:  Medication stabilization  Estimated Length of Stay: [place] d/c tomorrow  Attendees: Patient: 10/17/2016  10:23 AM  Physician: Dr. Elna BreslowEappen 10/17/2016  10:23 AM  Nursing: Lanora ManisElizabeth. I, RN  10/17/2016  10:23 AM  RN Care Manager: Onnie BoerJennifer Clark 10/17/2016  10:23 AM  Social Worker: Richelle Itood Betzayda Braxton, LCSW 10/17/2016  10:23 AM  Recreational Therapist: Aggie CosierMarjette Lindsey 10/17/2016  10:23 AM  Other: Baldo DaubJolan Williams, Social Work Intern  10/17/2016  10:23 AM  Other:  10/17/2016  10:23 AM  Other: 10/17/2016  10:23 AM    Scribe for Treatment Team: Baldo DaubJolan Williams, Social Work Intern 10/17/2016 10:23 AM

## 2016-10-17 NOTE — Progress Notes (Signed)
Recreation Therapy Notes  Date: 10/17/16 Time: 1000 Location: [address] Dayroom  Group Topic: Coping Skills  Goal Area(s) Addresses:  Pt will be able to identify positive coping skills. Pt will be able to identify the importance of using coping skills. Pt will be able to identify what changes for them if they use coping skills post d/c.  Intervention: Magazines, worksheets, glue sticks, scissors  Activity: Engineer, siteCoping Skills worksheet.  Patients were to identify coping skills that can be used for diversions, socially, cognitively, tension releasers and physically.  Pt were to identify at least two coping skills for each category.    Education: PharmacologistCoping Skills, Building control surveyorDischarge Planning.   Education Outcome: Acknowledges understanding/In group clarification offered/Needs additional education.   Clinical Observations/Feedback: Pt did not attend group.     Caroll RancherMarjette Adrina Armijo, LRT/CTRS     Caroll RancherLindsay, Jayra Choyce A 10/17/2016 12:15 PM

## 2016-10-17 NOTE — [facility] Suicide Risk Assessment (Signed)
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 GAD (generalized anxiety disorder) [F41.1] 11/07/2015  . 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   Continued Clinical Symptoms:  Alcohol Use Disorder Identification Test Final Score (AUDIT): 0 The "Alcohol Use Disorders Identification Test", Guidelines for Use in Primary Care, Second Edition.  World Science writerHealth Organization [facility](WHO). Score between 0-7:  no or low risk or alcohol related problems. Score between 8-15:  moderate risk of alcohol related problems. Score between 16-19:  high risk of alcohol related problems. Score 20 or above:  warrants further diagnostic evaluation for alcohol dependence and treatment.   CLINICAL FACTORS:   Severe Anxiety and/or Agitation Alcohol/Substance Abuse/Dependencies   Musculoskeletal: Strength & Muscle Tone: within normal limits Gait & Station: normal Patient leans: N/A  Psychiatric Specialty Exam: Physical Exam  Nursing note and vitals reviewed.   ROS  Blood pressure 133/78, pulse 68, temperature 98.3 F (36.8 C), temperature source Oral, resp. rate 18, height 6\' 2"  (1.88 m), weight 97.5 kg (215 lb), SpO2 99 %.Body mass index  is 27.6 kg/m.              Please see H&P.                                             COGNITIVE FEATURES THAT CONTRIBUTE TO RISK:  Closed-mindedness, Polarized thinking and Thought constriction (tunnel vision)    SUICIDE RISK:   Mild:  Suicidal ideation of limited frequency, intensity, duration, and specificity.  There are no identifiable plans, no associated intent, mild dysphoria and related symptoms, good self-control (both objective and subjective assessment), few other risk factors, and identifiable protective factors, including available and accessible social support.   PLAN OF CARE: Please see H&P.   I certify that inpatient services furnished can reasonably be expected to improve the patient's condition.  Mattye Verdone, MD 10/17/2016, 10:15 AM

## 2016-10-17 NOTE — [facility] Suicide Risk Assessment (Signed)
[facility] INPATIENT:  Family/Significant Other Suicide Prevention Education  Suicide Prevention Education:  Patient Refusal for Family/Significant Other Suicide Prevention Education: The patient Brad Barr has refused to provide written consent for family/significant other to be provided Family/Significant Other Suicide Prevention Education during admission and/or prior to discharge.  Physician notified.  Baldo DaubJolan Ivalene Platte 10/17/2016, 3:07 PM

## 2016-10-17 NOTE — [facility] Group Notes (Signed)
[facility] Group Notes:  (Counselor/Nursing/MHT/Case Management/Adjunct)  10/17/2016 1:15PM  Type of Therapy:  Group Therapy  Participation Level:  Active  Participation Quality:  Appropriate  Affect:  Flat  Cognitive:  Oriented  Insight:  Improving  Engagement in Group:  Limited  Engagement in Therapy:  Limited  Modes of Intervention:  Discussion, Exploration and Socialization  Summary of Progress/Problems: The topic for group was balance in life.  Pt participated in the discussion about when their life was in balance and out of balance and how this feels.  Pt discussed ways to get back in balance and short term goals they can work on to get where they want to be. Invited.  Chose to not attend.   [name], Oluwatosin Higginson B 10/17/2016 1:12 PM

## 2016-10-17 NOTE — B[facility] Counselor (Signed)
Adult Comprehensive Assessment  Patient ID: Brad Barr, male   DOB: [birth year], 40 y.o.   MRN: [id number]  Information Source: Information source: Patient  Current Stressors:  Educational / Learning stressors: high school graduate Employment / Job issues: sells on [employer] Family Relationships: good with mother and father Surveyor, quantityinancial / Lack of resources (include bankruptcy): limited income Housing / Lack of housing: Lives with parents  Physical health (include injuries & life threatening diseases): no concerns Social relationships: socially isolated, "no friends" Substance abuse: denies all current and former use, but father reports the patient has taken Xanax from his sister.  Bereavement / Loss: no issues reported  Living/Environment/Situation:  Living Arrangements: Lives with parents  Living conditions (as described by patient or guardian): Good  How long has patient lived in current situation?: 4  months  What is atmosphere in current home: Comfortable  Family History:  Marital status: Single Are you sexually active?: No What is your sexual orientation?: would not discuss Has your sexual activity been affected by drugs, alcohol, medication, or emotional stress?: would not discuss Does patient have children?: No  Childhood History:  By whom was/is the patient raised?: Both parents Description of patient's relationship with caregiver when they were a child: "good" Patient's description of current relationship with people who raised him/her: "seeing more of them lately" How were you disciplined when you got in trouble as a child/adolescent?: would not discuss Does patient have siblings?: Yes Number of Siblings: 2 Description of patient's current relationship with siblings: brother and sister, doesnt see much of them Did patient suffer any verbal/emotional/physical/sexual abuse as a child?: No Did patient suffer from severe childhood neglect?: No Has patient ever been sexually  abused/assaulted/raped as an adolescent or adult?: No Was the patient ever a victim of a crime or a disaster?: No Witnessed domestic violence?: No Has patient been effected by domestic violence as an adult?: No  Education:  Highest grade of school patient has completed: high school graduate Currently a Consulting civil engineerstudent?: No Learning disability?: No  Employment/Work Situation:   Employment situation: Employed 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 How long has patient been employed?: 2007 Patient's job has been impacted by current illness: No What is the longest time patient has a held a job?: 8 years 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 Has patient ever been in the Eli Lilly and Companymilitary?: No Has patient ever served in combat?: No Did You Receive Any Psychiatric Treatment/Services While in Equities traderthe Military?: No Are There Guns or Other Weapons in Your Home?: No  Financial Resources:   Financial resources: Income from employment, Support from parents/caregivers Does patient have a representative payee or guardian?: No  Alcohol/Substance Abuse:   What has been your use of drugs/alcohol within the last 12 months?: denies all current and former use of drugs and alcohol If attempted suicide, did drugs/alcohol play a role in this?: No Alcohol/Substance Abuse Treatment Hx: Denies past history Has alcohol/substance abuse ever caused legal problems?: No  Social Support System:   Forensic psychologistatient's Community Support System: Poor Describe Community Support System: "I dont have any friends" Type of faith/religion: Ephriam KnucklesChristian How does patient's faith help to cope with current illness?: reads Bible and attends church  Leisure/Recreation:   Leisure and Hobbies: reading Bible  Strengths/Needs:   What things does the patient do well?: "I dont know" In what areas does patient struggle / problems for patient: "I dont know"  Discharge Plan:   Does patient have  access to  transportation?: Yes Will patient be returning to same living situation after discharge?: Yes Currently receiving community mental health services: No If no, would patient like referral for services when discharged?: Yes (What county?) ([facility] Does patient have financial barriers related to discharge medications?: No (despite lack of insurance, pt denies issues with affording medications)  Summary/Recommendations:   Patient refused to meet with CSW intern to complete PSA. 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 Patient's mother initiated an IVC and [facility] Patent examinerlaw enforcement for aggressive and self mutilating behaviors. Per the IVC paperwork, Brad Barr has been diagnosed with schizophrenia and depression. According to the patient's mother, Brad Barr does not take his medications regularly and is self mutilating by striking himself about the head, arms and chest the area. Brad Barr can benefit from crisis stabilization, medication management, therapeutic milieu, and referral services.     [name]. 10/17/2016

## 2016-10-17 NOTE — Progress Notes (Signed)
DAR NOTE: Patient presents with flat affect and depressed mood.  Denies pain, auditory and visual hallucinations.  Maintained on routine safety checks.  Medications given as prescribed.  Support and encouragement offered as needed.  Patient is withdrawn and isolative.  Remained in his room most of this shift.  Minimal interaction with staff.  Offered no complaint.

## 2016-10-17 NOTE — Progress Notes (Signed)
D: Pt was in bed in his room upon initial approach.  Pt presents with depressed affect and mood.  He has been isolating to his room and he forwards little information to Clinical research associatewriter.  His goal is to "have a good night."  Pt denies SI/HI, denies hallucinations, denies pain.  A: Introduced self to pt.  Actively listened to pt and offered support and encouragement.  R: Pt is safe on the unit.  Pt verbally contracts for safety.  Will continue to monitor and assess.

## 2016-10-18 LAB — TSH: TSH: 1.587 u[IU]/mL (ref 0.350–4.500)

## 2016-10-18 LAB — LIPID PANEL
CHOLESTEROL: 206 mg/dL — AB (ref 0–200)
HDL: 40 mg/dL — ABNORMAL LOW (ref >40)
LDL Cholesterol: 147 mg/dL — ABNORMAL HIGH (ref 0–99)
TRIGLYCERIDES: 96 mg/dL (ref <150)
Total CHOL/HDL Ratio: 5.2 RATIO
VLDL: 19 mg/dL (ref 0–40)

## 2016-10-18 MED ORDER — TRAZODONE HCL 50 MG PO TABS
50.0000 mg | ORAL_TABLET | Freq: Every evening | ORAL | Status: DC | PRN
  Administered 2016-10-18: 50 mg via ORAL
  Filled 2016-10-18: qty 1
  Filled 2016-10-18 (×2): qty 14
  Filled 2016-10-18: qty 1
  Filled 2016-10-18 (×2): qty 14
  Filled 2016-10-18 (×2): qty 1

## 2016-10-18 NOTE — [facility] Group Notes (Signed)
[facility] LCSW Group Therapy  10/18/2016  1:05 PM  Type of Therapy:  Group therapy  Participation Level:  Active  Participation Quality:  Attentive  Affect:  Flat  Cognitive:  Oriented  Insight:  Limited  Engagement in Therapy:  Limited  Modes of Intervention:  Discussion, Socialization  Summary of Progress/Problems:  Chaplain was here to lead a group on themes of hope and courage. Mirl stayed the entire time, engaged throughout.  Minimal interaction.  "I'm listening to others talk about care, and I'm learning a lot."  [name], Nakyiah Kuck B 10/18/2016 1:50 PM

## 2016-10-18 NOTE — Progress Notes (Signed)
Adult Psychoeducational Group Note  Date:  10/18/2016 Time:  9:43 PM  Group Topic/Focus:  Wrap-Up Group:   The focus of this group is to help patients review their daily goal of treatment and discuss progress on daily workbooks.   Participation Level:  Active  Participation Quality:  Appropriate  Affect:  Appropriate  Cognitive: Appropriate  Insight: Appropriate  Engagement in Group:  Engaged  Modes of Intervention:  Socialization and Support  Additional Comments: Patient attended and participated in group tonight. He reports that he had his vitals done this morning, he went for lunch and dinner. He went to his groups and had a good day overall. His discharge is schedule for tomorrow.  Lita MainsFrancis, Liah Morr [facility] 10/18/2016, 9:43 PM

## 2016-10-18 NOTE — Progress Notes (Signed)
Recreation Therapy Notes  Date: 10/18/16 Time: 1000 Location: 500 Hall Dayroom  Group Topic: Communication, Team Building, Problem Solving  Goal Area(s) Addresses:  Patient will effectively work with peer towards shared goal.  Patient will identify skill used to make activity successful.  Patient will identify how skills used during activity can be used to reach post d/c goals.   Intervention: STEM Activity   Activity: [employer]away[employer]ipe Cleaner Tower. In teams, patients were asked to build the tallest freestanding tower possible out of 15 pipe cleaners. Systematically resources were removed, for example patient ability to use both hands and patient ability to verbally communicate.    Education: Pharmacist, communityocial Skills, Building control surveyorDischarge Planning.   Education Outcome: Acknowledges education/In group clarification offered/Needs additional education.   Clinical Observations/Feedback: Pt did not attend group.   Caroll RancherMarjette Robyn Nohr, LRT/CTRS    Caroll RancherLindsay, Kymberlee Viger A 10/18/2016 12:24 PM

## 2016-10-18 NOTE — Progress Notes (Signed)
DAR NOTE: Patient presents with flat affect and depressed mood.  Denies pain, auditory and visual hallucinations.  Rates depression at 0, hopelessness at 0, and anxiety at 0.  Described energy level as normal and concentration as good.  Maintained on routine safety checks.  Medications given as prescribed.  Support and encouragement offered as needed.  Attended group and participated.  States goal for today is "have a good day."  Patient was visible in milieu.  Minimal interaction with staff and peers.  Offered no complaint.

## 2016-10-18 NOTE — Progress Notes (Signed)
  [facility] :  Will you be returning to the same living situation after discharge:  Yes,  home At discharge, do you have transportation home?: Yes,  family Do you have the ability to pay for your medications: Yes,  Family Services  Release of information consent forms completed and in the chart;  Patient's signature needed at discharge.  Patient to Follow up at: Follow-up Information    [facility] Follow up on 10/21/2016.   Specialty:  Professional Counselor Why:  Monday at 3:00 with Dartha LodgeAnthony Steele. Contact information: [address] [place] [phone]           Next level of care provider has [facility] to [facility] Link:no  Safety Planning and Suicide Prevention discussed: Yes,  yes  Have you used any form of tobacco in the last 30 days? (Cigarettes, Smokeless Tobacco, Cigars, and/or Pipes): No  Has patient been referred to the Quitline?: N/A patient is not a smoker  Patient has been referred for addiction treatment: Pt. refused referral  Brad Barr 10/18/2016, 11:15 AM

## 2016-10-18 NOTE — Progress Notes (Signed)
[facility] MD Progress Note  10/18/2016 12:50 PM Brad Barr  MRN:  [id number] Subjective:  Patient states " I am fine.'  Objective:Brad Barr a 39 y.o.caucasian male,single , lives in [place] with his parents , who presentedunder IVC, taken out by his mother  For worsening aggressive behavior and delusions.  Patient seen and chart reviewed.Discussed patient with treatment team.  Pt today observed as calm , less anxious than yesterday. Pt continues to be withdrawn and isolative . Pt advised to attend groups and participate in milieu - he agreed. Per RN - has been taking his medications , denies ADRs. Reassurance provided. Provided substance abuse counseling as well as medications education.     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 GAD (generalized anxiety disorder) [F41.1] 11/07/2015  . 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   Past Medical History:  Past Medical History:  Diagnosis Date  . ADHD (attention deficit hyperactivity disorder)   . Schizophrenia (HCC)    Pt requesting this be removed.    History reviewed. No pertinent surgical history. Family History:  Family History  Problem Relation Age of Onset  . Mental illness Other   . Mental illness Sister    Family Psychiatric  History: Please see H&P.  Social History: Please see H&P.  History  Alcohol Use No     History  Drug Use No    Social History   Social History  . Marital status: Single    Spouse name: N/A  . Number of children: N/A  .  Years of education: N/A   Social History Main Topics  . Smoking status: Former Games developer  . Smokeless tobacco: Former Neurosurgeon     Comment: Patient uses vapor.  . Alcohol use No  . Drug use: No  . Sexual activity: No   Other Topics Concern  . None   Social History Narrative  . None   Additional Social History:    Pain Medications: Pt denies.  Prescriptions: Pt denies.  Over the Counter: Pt denies.  History of alcohol / drug use?: No history of alcohol / drug abuse Longest period of sobriety (when/how long): denies current usage Negative Consequences of Use: Personal relationships                    Sleep: Fair  Appetite:  Fair  Current Medications: Current Facility-Administered Medications  Medication Dose Route Frequency Provider Last Rate Last Dose  . acetaminophen (TYLENOL) tablet 650 mg  650 mg Oral Q6H PRN Charm Rings, NP      . alum & mag hydroxide-simeth (MAALOX/MYLANTA) 200-200-20 MG/5ML suspension 30 mL  30 mL Oral Q4H PRN Charm Rings, NP      . benztropine (COGENTIN) tablet 1 mg  1 mg Oral BID Jomarie Longs, MD   1 mg at 10/18/16 0810  . citalopram (CELEXA) tablet 20 mg  20 mg Oral Daily Charm Rings, NP   20 mg at 10/18/16 0810  . haloperidol (HALDOL) tablet 5 mg  5 mg Oral BID Charm Rings, NP   5 mg at 10/18/16 0810  .  hydrOXYzine (ATARAX/VISTARIL) tablet 25 mg  25 mg Oral Q6H PRN Icela Glymph, MD      . magnesium hydroxide (MILK OF MAGNESIA) suspension 30 mL  30 mL Oral Daily PRN Charm RingsJamison Y Lord, NP      . OLANZapine (ZYPREXA) tablet 5 mg  5 mg Oral TID PRN Jomarie LongsSaramma Amahd Morino, MD       Or  . OLANZapine (ZYPREXA) injection 5 mg  5 mg Intramuscular TID PRN Jomarie LongsSaramma Caron Ode, MD        Lab Results:  Results for orders placed or performed during the hospital encounter of 10/16/16 (from the past 48 hour(s))  TSH     Status: None   Collection Time: 10/18/16  6:28 AM  Result Value Ref Range   TSH 1.587 0.350 - 4.500 uIU/mL    Comment: Performed by a 3rd  Generation assay with a functional sensitivity of <=0.01 uIU/mL. [facility] at [facility]   Lipid panel     Status: Abnormal   Collection Time: 10/18/16  6:28 AM  Result Value Ref Range   Cholesterol 206 (H) 0 - 200 mg/dL   Triglycerides 96 <621<150 mg/dL   HDL 40 (L) >30>40 mg/dL   Total CHOL/HDL Ratio 5.2 RATIO   VLDL 19 0 - 40 mg/dL   LDL Cholesterol 865147 (H) 0 - 99 mg/dL    Comment:        Total Cholesterol/HDL:CHD Risk Coronary Heart Disease Risk Table                     Men   Women  1/2 Average Risk   3.4   3.3  Average Risk       5.0   4.4  2 X Average Risk   9.6   7.1  3 X Average Risk  23.4   11.0        Use the calculated Patient Ratio above and the CHD Risk Table to determine the patient's CHD Risk.        ATP III CLASSIFICATION (LDL):  <100     mg/dL   Optimal  [id number]  mg/dL   Near or Above                    Optimal  130-159  mg/dL   Borderline  [id number]  mg/dL   High  >132>190     mg/dL   Very High [facility]     Blood Alcohol level:  Lab Results  Component Value Date   [facility] <5 10/15/2016   ETH <5 07/02/2016    Metabolic Disorder Labs: Lab Results  Component Value Date   HGBA1C 5.5 11/07/2015   MPG 111 11/07/2015   Lab Results  Component Value Date   PROLACTIN 79.3 (H) 02/23/2016   PROLACTIN 63.1 (H) 11/07/2015   Lab Results  Component Value Date   CHOL 206 (H) 10/18/2016   TRIG 96 10/18/2016   HDL 40 (L) 10/18/2016   CHOLHDL 5.2 10/18/2016   VLDL 19 10/18/2016   LDLCALC 147 (H) 10/18/2016   LDLCALC 116 (H) 11/07/2015    Physical Findings: AIMS: Facial and Oral Movements Muscles of Facial Expression: (P) None, normal Lips and Perioral Area: (P) None, normal Jaw: (P) None, normal Tongue: (P) None, normal,Extremity Movements Upper (arms, wrists, hands, fingers): (P) None, normal Lower (legs, knees, ankles, toes): (P) None, normal, Trunk Movements Neck, shoulders, hips: (P) None, normal, Overall  Severity Severity of abnormal movements (highest  score from questions above): (P) None, normal Incapacitation due to abnormal movements: (P) None, normal Patient's awareness of abnormal movements (rate only patient's report): (P) No Awareness, Dental Status Current problems with teeth and/or dentures?: (P) No Does patient usually wear dentures?: (P) No  CIWA:    COWS:     Musculoskeletal: Strength & Muscle Tone: within normal limits Gait & Station: normal Patient leans: N/A  Psychiatric Specialty Exam: Physical Exam  Nursing note and vitals reviewed.   Review of Systems  Psychiatric/Behavioral: The patient is nervous/anxious.   All other systems reviewed and are negative.   Blood pressure 133/78, pulse 68, temperature 98.3 F (36.8 C), temperature source Oral, resp. rate 18, height 6\' 2"  (1.88 m), weight 97.5 kg (215 lb), SpO2 99 %.Body mass index is 27.6 kg/m.  General Appearance: Guarded  Eye Contact:  Fair  Speech:  Normal Rate  Volume:  Normal  Mood:  Anxious  Affect:  Congruent  Thought Process:  Goal Directed and Descriptions of Associations: Intact  Orientation:  Full (Time, Place, and Person)  Thought Content:  Logical  Suicidal Thoughts:  No  Homicidal Thoughts:  No  Memory:  Immediate;   Fair Recent;   Fair Remote;   Fair  Judgement:  Impaired  Insight:  Shallow  Psychomotor Activity:  Decreased  Concentration:  Concentration: Fair and Attention Span: Fair  Recall:  [employer]Fair  Fund of Knowledge:  Fair  Language:  Fair  Akathisia:  No  Handed:  Right  AIMS (if indicated):     Assets:  Desire for Improvement Social Support  ADL's:  Intact  Cognition:  WNL  Sleep:  Number of Hours: 6.75     Treatment Plan Summary:Patient is improving , however continues to have severe issues with his insight and judgement - continues to present as passive , withdrawn.  Will continue today 10/18/16 plan as below except where it is noted.  Daily contact with patient to  assess and evaluate symptoms and progress in treatment and Medication management Will continue celexa 10 mg po daily for affective sx. Will continue  Haldol 5 mg po bid for mood sx/psychosis. Will reduce Cogentin to 0.5 mg po bid for eps. Will continue  prn medications as per agitation protocol. Will continue to monitor vitals ,medication compliance and treatment side effects while patient is here. Will monitor for medical issues as well as call consult as needed. Reviewed labs  lipid panel - abnormal - diet control recommendations, tsh - wnl  , pending hba1c, pl as well as EKG for qtc. CSW will continue working on disposition.Pt to be referred to a substance abuse treatment program if he is motivated to do so. Patient to participate in therapeutic milieu Jamara Vary, MD 10/18/2016, 12:50 PM

## 2016-10-18 NOTE — Progress Notes (Signed)
DAR Note: Pt is flat, isolative and withdrawn to room; was in room all evening. Pt denies any form of depression, anxiety, pain, SI, HI and AVH. He states, "I don't need to be here; I'm here because I has put here; there is nothing wrong with me." Pt remained calm and cooperative through the assessment. Support, encouragement, and safe environment provided.  Pt did not attend karaoke group.15-minute safety checks continue.

## 2016-10-19 DIAGNOSIS — Z818 Family history of other mental and behavioral disorders: Secondary | ICD-10-CM

## 2016-10-19 DIAGNOSIS — F900 Attention-deficit hyperactivity disorder, predominantly inattentive type: Secondary | ICD-10-CM

## 2016-10-19 DIAGNOSIS — F1320 Sedative, hypnotic or anxiolytic dependence, uncomplicated: Secondary | ICD-10-CM

## 2016-10-19 DIAGNOSIS — F411 Generalized anxiety disorder: Secondary | ICD-10-CM

## 2016-10-19 DIAGNOSIS — Z87891 Personal history of nicotine dependence: Secondary | ICD-10-CM

## 2016-10-19 DIAGNOSIS — F1994 Other psychoactive substance use, unspecified with psychoactive substance-induced mood disorder: Principal | ICD-10-CM

## 2016-10-19 LAB — HEMOGLOBIN A1C
HEMOGLOBIN A1C: 5.3 % (ref 4.8–5.6)
Mean Plasma Glucose: 105 mg/dL

## 2016-10-19 LAB — PROLACTIN: Prolactin: 35.3 ng/mL — ABNORMAL HIGH (ref 4.0–15.2)

## 2016-10-19 MED ORDER — HALOPERIDOL 5 MG PO TABS: 5.0000 mg | ORAL_TABLET | Freq: Two times a day (BID) | ORAL | 0 refills | Status: DC

## 2016-10-19 MED ORDER — CITALOPRAM HYDROBROMIDE 20 MG PO TABS: 20.0000 mg | ORAL_TABLET | Freq: Every day | ORAL | 0 refills | Status: DC

## 2016-10-19 MED ORDER — BENZTROPINE MESYLATE 1 MG PO TABS: 1.0000 mg | ORAL_TABLET | Freq: Two times a day (BID) | ORAL | 0 refills | Status: DC

## 2016-10-19 MED ORDER — TRAZODONE HCL 50 MG PO TABS: 50.0000 mg | ORAL_TABLET | Freq: Every evening | ORAL | 0 refills | Status: DC | PRN

## 2016-10-19 MED ORDER — HYDROXYZINE HCL 25 MG PO TABS: 25.0000 mg | ORAL_TABLET | Freq: Four times a day (QID) | ORAL | 0 refills | Status: DC | PRN

## 2016-10-19 NOTE — [facility] Group Notes (Signed)
The focus of this group is to educate the patient on the purpose and policies of crisis stabilization and provide a format to answer questions about their admission.  The group details unit policies and expectations of patients while admitted.  Patient did not attend 0900 nurse education orientation group this morning.  Patient stayed in his room. 

## 2016-10-19 NOTE — Progress Notes (Signed)
D:  Patient's self inventory sheet, patient sleeps good, sleep medication is helpful.  Good appetite, normal energy level, good concentration.  Denied depression, hopeless and anxiety.  Denied withdrawals.  Denied SI.  Denied physical problems.  Denied pain.  Goal is to have a good day.  Does have discharge plans. A:  Medications administered per MD orders.  Emotional support and encouragement given patient. R:  Denied SI and HI, contracts for safety.  Denied A/V hallucinations.  Safety maintained with 15 minute checks.

## 2016-10-19 NOTE — [facility] Group Notes (Signed)
[facility] Group Notes:  (Clinical Social Work)  10/19/2016  11:15-12:00PM  Summary of Progress/Problems:   Today's process group involved patients discussing their feelings related to being hospitalized, as well as benefits they see to being in the hospital.  Most patients chose to take the time to describe what happened that resulted in their hospital admission.  They gave each other supportive feedback about these personal stories.   The patient expressed a primary feeling about being hospitalized is that he is glad he is going home today.  He said he did not take his medicine the one day that he was admitted, and there are strict rules where lives about your medications.  Therefore he was IVC'd, he said, and no changes were made to his meds.  He was in and out of the room, not engaged at all.  Type of Therapy:  Group Therapy - Process  Participation Level:  Minimal  Participation Quality:  Inattentive  Affect:  Blunted  Cognitive:  Disorganized  Insight:  Lacking  Engagement in Therapy:  Limited  Modes of Intervention:  Exploration, Discussion  Ambrose MantleMareida Grossman-Orr, LCSW 10/19/2016, 1:02 PM

## 2016-10-19 NOTE — Progress Notes (Signed)
Discharge Note:   Patient discharged with bus ticket.   Patient denied SI and HI.  Denied A/V hallucination.  Suicide prevention information given and discussed with patient who stated he understood and had no questions.  Patient stated he received all his belongings, clothing, wallet, money, phone, belt, medications, prescriptions, etc.  Patient stated [facility].  All required discharge information given to patient at discharge.

## 2016-10-19 NOTE — [facility] Suicide Risk Assessment (Signed)
[facility]   Principal Problem: Substance or medication-induced bipolar and related disorder [facility](HCC) Discharge Diagnoses:  Patient Active Problem List   Diagnosis Date Noted  . 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 GAD (generalized anxiety disorder) [F41.1] 11/07/2015  . 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 rate 16, height 6\' 2"  (1.88 m), weight 97.5 kg (215 lb), SpO2 99 %.Body mass index is 27.6 kg/m.  General Appearance: Casual and Fairly Groomed  Patent attorneyye Contact::  Fair  Speech:  [phone]  Volume:  Normal  Mood:  Anxious  Affect:  Congruent  Thought Process:  Goal Directed  Orientation:  Full (Time, Place, and Person)  Thought Content:  WDL and Logical  Suicidal Thoughts:  No  Homicidal Thoughts:  No  Memory:  Immediate;   Fair Recent;   Fair Remote;   Fair  Judgement:  Fair  Insight:  Fair  Psychomotor Activity:  Decreased  Concentration:  Fair  Recall:  Good  Fund of Knowledge:Good  Language: Good  Akathisia:  No  Handed:  Right  AIMS (if indicated):     Assets:  Communication Skills Desire for Improvement Housing  Sleep:  Number of Hours: 6.75  Cognition: WNL  ADL's:  Intact   Mental Status Per Nursing Assessment::   On Admission:  NA  Demographic Factors:  Male  Loss Factors: Loss of significant relationship  Historical Factors: Impulsivity  Risk Reduction Factors:   Sense of responsibility to family, Religious beliefs  about death, Employed, Living with another person, especially a relative, Positive social support, Positive therapeutic relationship and Positive coping skills or problem solving skills  Continued Clinical Symptoms:  Alcohol/Substance Abuse/Dependencies More than one psychiatric diagnosis Unstable or Poor Therapeutic Relationship Previous Psychiatric Diagnoses and Treatments  Cognitive Features That Contribute To Risk:  Polarized thinking    Suicide Risk:  Minimal: No identifiable suicidal ideation.  Patients presenting with no risk factors but with morbid ruminations; may be classified as minimal risk based on the severity of the depressive symptoms  Follow-up Information    [facility] Follow up on 10/21/2016.   Specialty:  Professional Counselor Why:  Monday at 3:00 with Dartha LodgeAnthony Steele. Contact information: [address] [place] [address] [phone]           Plan Of Care/Follow-up recommendations:  Activity:  as tolerated Diet:  unchanged from the past Other:  patient will [facility]  Shaquavia Whisonant T., MD 10/19/2016, 9:43 AM

## 2016-10-19 NOTE — Discharge Summary (Signed)
Physician Discharge Summary Note  Patient:  Brad KeyJohnny Arave is an 40 y.o., male MRN:  [id number] DOB:  16-Apr-1976 Patient phone:  [phone] (home)  Patient address:   [address] [place] [address],  Total Time spent with patient: 45 minutes  Date of Admission:  10/16/2016 Date of Discharge: 10/19/16  Reason for Admission:   Kweli Nunnis a 39 y.o.caucasian male,single , [place] , who presentedunder IVC, taken out by his mother  For worsening aggressive behavior and delusions.  Per initial notes in EHR : ' Governor Nunnis an 39 y.o.male, who [facility]. Pt's mother IVC'd him. Per the IVC paperwork: "The respondent has been diagnosed as schizophrenia and depression. The respondent was prescribed Benztropine, Citalopram and Haloperidol. The respondent does not take the medication regularly. The respondent is hostile and aggressive, violent and throws objects in the home. The respondent is self mutilating by striking himself about the head, arms and chest the area. The respondent has beat himself about the face and has blackened both eyes. The respondent states that he has no control of his actions. The respondent states that drones control his desire to harm himself and his actions are sanctioned the FBI. The respondent has been [facility] in the past and is a danger to himself and others."   Patient seen and chart reviewed TODAY .Discussed patient with treatment team. Pt today is seen as calm, cooperative , he attempts to minimize the events that led to his hospitalization. Pt states he does not know what happened , " Its my parents.' Pt reports anxiety , does not elaborate. Pt denies any other concerns . Pt appears guarded and appears to be a limited historian - hence majority of the information obtained from EHR as noted above.  Collateral information was obtained from parents - called mother Jerel ShepherdLinda Nihiser - got  return call from parents - per father patient currently lives with him and his wife and he has been closely supervising his medications and giving it daily. However , pt continues to cheek it or spit it out. When he takes his medications he is good , otherwise he can get very impulsive, aggressive . Per father , patient's drug addict sister gave him , most likely some xanax pills few days ago and patient has been abusing it . Pt took it few days ago, don't know how many pills he took , he was asleep for 3 days , he woke up and then started acting out , was throwing things , was threatening to hurt family and was verbally abusive towards mother. Hence parents IVced him since they felt he needed to be in a safe setting. Father would like patient to go to a substance abuse rehab from here . It was discussed with him that it can only be done with patient's consent."  Principal Problem: Substance or medication-induced bipolar and related disorder [facility](HCC) Discharge Diagnoses: Patient Active Problem List   Diagnosis Date Noted  . 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 GAD (generalized anxiety disorder) [F41.1] 11/07/2015  . 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 ADHD (attention deficit hyperactivity disorder)   . Schizophrenia (HCC)    Pt requesting  this be removed.    History reviewed. No pertinent surgical history. Family History:  Family History  Problem Relation Age of Onset  . Mental illness Other   . Mental illness Sister    Family Psychiatric  History: See H&P Social History:  History  Alcohol Use No     History  Drug Use No    Social History   Social History  . Marital  status: Single    Spouse name: N/A  . Number of children: N/A  . Years of education: N/A   Social History Main Topics  . Smoking status: Former Games developer  . Smokeless tobacco: Former Neurosurgeon     Comment: Patient uses vapor.  . Alcohol use No  . Drug use: No  . Sexual activity: No   Other Topics Concern  . None   Social History Narrative  . None    Hospital Course:   Berwyn Bigley was admitted for Substance or medication-induced bipolar and related disorder [facility]), and crisis management.  Pt was treated discharged with the medications listed below under Medication List.  Medical problems were identified and treated as needed.  Home medications were restarted as appropriate.   Improvement was monitored by observation and Brad Barr 's daily report of symptom reduction.  Emotional and mental status was monitored by daily self-inventory reports completed by Brad Barr and clinical staff.         Kosta Schnitzler was evaluated by the treatment team for stability and plans for continued recovery upon discharge. Angeldejesus Callaham 's motivation was an integral factor for scheduling further treatment. Employment, transportation, bed availability, health status, family support, and any pending legal issues were also considered during hospital stay. Pt was offered further treatment options upon discharge including but not limited to Residential, Intensive Outpatient, and Outpatient treatment.  Allie Gerhold will follow up with the services as listed below under Follow Up Information.     Upon completion of this admission the patient was both mentally and medically stable for discharge denying suicidal/homicidal ideation, auditory/visual/tactile hallucinations, delusional thoughts and paranoia.    Brad Barr responded well to treatment with cogentin, celexa, haldol, vistaril, trazodone without adverse effects. Pt demonstrated improvement without reported or observed adverse effects to the point of stability  appropriate for outpatient management. Pertinent labs include: cholesterol 206, LDL 147, Prolactin 35.3 for which outpatient follow-up is necessary for lab recheck as mentioned below. Reviewed CBC, CMP, BAL, and UDS; all unremarkable aside from noted exceptions.   Physical Findings: AIMS: Facial and Oral Movements Muscles of Facial Expression: None, normal Lips and Perioral Area: None, normal Jaw: None, normal Tongue: None, normal,Extremity Movements Upper (arms, wrists, hands, fingers): None, normal Lower (legs, knees, ankles, toes): None, normal, Trunk Movements Neck, shoulders, hips: None, normal, Overall Severity Severity of abnormal movements (highest score from questions above): None, normal Incapacitation due to abnormal movements: None, normal Patient's awareness of abnormal movements (rate only patient's report): No Awareness, Dental Status Current problems with teeth and/or dentures?: No Does patient usually wear dentures?: No  CIWA:    COWS:     Musculoskeletal: Strength & Muscle Tone: within normal limits Gait & Station: normal Patient leans: N/A  Psychiatric Specialty Exam: Physical Exam  Review of Systems  Psychiatric/Behavioral: Positive for depression. Negative for hallucinations and suicidal ideas. The patient is nervous/anxious and has insomnia.   All other systems reviewed and are negative.   Blood pressure (!) 141/81, pulse 69, temperature 97.6 F (36.4 C), temperature source Oral, resp. rate  16, height 6\' 2"  (1.88 m), weight 97.5 kg (215 lb), SpO2 99 %.Body mass index is 27.6 kg/m.  SEE MD PSE WITHIN SRA  Have you used any form of tobacco in the last 30 days? (Cigarettes, Smokeless Tobacco, Cigars, and/or Pipes): No  Has this patient used any form of tobacco in the last 30 days?  (Cigarettes, Smokeless Tobacco, Cigars, and/or Pipes) No   Blood Alcohol level:  Lab Results  Component Value Date   ETH <5 10/15/2016   ETH <5 07/02/2016    Metabolic  Disorder Labs:  Lab Results  Component Value Date   HGBA1C 5.3 10/18/2016   MPG 105 10/18/2016   MPG 111 11/07/2015   Lab Results  Component Value Date   PROLACTIN 35.3 (H) 10/18/2016   PROLACTIN 79.3 (H) 02/23/2016   Lab Results  Component Value Date   CHOL 206 (H) 10/18/2016   TRIG 96 10/18/2016   HDL 40 (L) 10/18/2016   CHOLHDL 5.2 10/18/2016   VLDL 19 10/18/2016   LDLCALC 147 (H) 10/18/2016   LDLCALC 116 (H) 11/07/2015    See Psychiatric Specialty Exam and Suicide Risk Assessment completed by Attending Physician prior to discharge.  Discharge destination:  Home  Is patient on multiple antipsychotic therapies at discharge:  No   Has Patient had three or more failed trials of antipsychotic monotherapy by history:  No  Recommended Plan for Multiple Antipsychotic Therapies: NA     Medication List    STOP taking these medications   finasteride 1 MG tablet Commonly known as:  PROPECIA     TAKE these medications     Indication  benztropine 1 MG tablet Commonly known as:  COGENTIN Take 1 tablet (1 mg total) by mouth 2 (two) times daily.  Indication:  Extrapyramidal Reaction caused by Medications   citalopram 20 MG tablet Commonly known as:  CELEXA Take 1 tablet (20 mg total) by mouth daily. Start taking on:  10/20/2016 What changed:  medication strength  how much to take  Indication:  Aggressive Behavior, Depression   haloperidol 5 MG tablet Commonly known as:  HALDOL Take 1 tablet (5 mg total) by mouth 2 (two) times daily.  Indication:  Psychosis, Schizophrenia   hydrOXYzine 25 MG tablet Commonly known as:  ATARAX/VISTARIL Take 1 tablet (25 mg total) by mouth every 6 (six) hours as needed for anxiety.  Indication:  Anxiety Neurosis   traZODone 50 MG tablet Commonly known as:  DESYREL Take 1 tablet (50 mg total) by mouth at bedtime as needed for sleep.  Indication:  Trouble Sleeping      Follow-up Information    [facility]  Follow up on 10/21/2016.   Specialty:  Professional Counselor Why:  Monday at 3:00 with Dartha LodgeAnthony Steele. Contact information: [address] [place] [address] [phone]           Follow-up recommendations:  Activity:  As tolerated Diet:  Heart healthy with low sodium.  Comments:   Take all medications as prescribed. Keep all follow-up appointments as scheduled.  Do not consume alcohol or use illegal drugs while on prescription medications. Report any adverse effects from your medications to your primary care provider promptly.  In the event of recurrent symptoms or worsening symptoms, call 911, a crisis hotline, or go to the nearest emergency department for evaluation.   Signed: Beau FannyWithrow, Lener Ventresca C, FNP 10/19/2016, 9:49 AM

## 2016-10-19 NOTE — Progress Notes (Signed)
D: Pt is alert and oriented x 4. Pt denies any form of depression, anxiety, pain, SI, HI or AVH. He states, "I don't know where you all get any of these information about me; I am not trying to hurt myself." Pt remained calm and cooperative through the assessment. A: Medications offered as prescribed.  Support, encouragement, and safe environment provided.  15-minute safety checks continue. R: Pt was med compliant. Pt attended wrap-up group. Safety checks continue

## 2016-12-15 ENCOUNTER — Emergency Department (HOSPITAL_COMMUNITY)
Admission: EM | Admit: 2016-12-15 | Discharge: 2016-12-16 | Disposition: A | Discharge status: Other Acute Inpatient Hospital | Payer: No Typology Code available for payment source | Attending: Emergency Medicine | Admitting: Emergency Medicine

## 2016-12-15 ENCOUNTER — Encounter (HOSPITAL_COMMUNITY): Payer: Self-pay | Admitting: Emergency Medicine

## 2016-12-15 DIAGNOSIS — F3163 Bipolar disorder, current episode mixed, severe, without psychotic features: Secondary | ICD-10-CM | POA: Diagnosis present

## 2016-12-15 DIAGNOSIS — Z87891 Personal history of nicotine dependence: Secondary | ICD-10-CM | POA: Insufficient documentation

## 2016-12-15 DIAGNOSIS — F909 Attention-deficit hyperactivity disorder, unspecified type: Secondary | ICD-10-CM | POA: Insufficient documentation

## 2016-12-15 DIAGNOSIS — Z79899 Other long term (current) drug therapy: Secondary | ICD-10-CM | POA: Insufficient documentation

## 2016-12-15 HISTORY — DX: Bipolar disorder, unspecified: F31.9

## 2016-12-15 LAB — COMPREHENSIVE METABOLIC PANEL
ALT: 37 U/L (ref 17–63)
AST: 23 U/L (ref 15–41)
Albumin: 4.9 g/dL (ref 3.5–5.0)
Alkaline Phosphatase: 68 U/L (ref 38–126)
Anion gap: 8 (ref 5–15)
BILIRUBIN TOTAL: 0.8 mg/dL (ref 0.3–1.2)
BUN: 14 mg/dL (ref 6–20)
CO2: 25 mmol/L (ref 22–32)
Calcium: 9.5 mg/dL (ref 8.9–10.3)
Chloride: 102 mmol/L (ref 101–111)
Creatinine, Ser: 0.72 mg/dL (ref 0.61–1.24)
Glucose, Bld: 93 mg/dL (ref 65–99)
POTASSIUM: 4 mmol/L (ref 3.5–5.1)
Sodium: 135 mmol/L (ref 135–145)
TOTAL PROTEIN: 7.8 g/dL (ref 6.5–8.1)

## 2016-12-15 LAB — CBC
HEMATOCRIT: 45.2 % (ref 39.0–52.0)
Hemoglobin: 16 g/dL (ref 13.0–17.0)
MCH: 28.2 pg (ref 26.0–34.0)
MCHC: 35.4 g/dL (ref 30.0–36.0)
MCV: 79.7 fL (ref 78.0–100.0)
Platelets: 230 10*3/uL (ref 150–400)
RBC: 5.67 MIL/uL (ref 4.22–5.81)
RDW: 12.8 % (ref 11.5–15.5)
WBC: 6.6 10*3/uL (ref 4.0–10.5)

## 2016-12-15 LAB — ACETAMINOPHEN LEVEL

## 2016-12-15 LAB — RAPID URINE DRUG SCREEN, HOSP PERFORMED
Amphetamines: NOT DETECTED
BARBITURATES: NOT DETECTED
BENZODIAZEPINES: NOT DETECTED
COCAINE: NOT DETECTED
OPIATES: NOT DETECTED
Tetrahydrocannabinol: NOT DETECTED

## 2016-12-15 LAB — SALICYLATE LEVEL: Salicylate Lvl: <7 mg/dL (ref 2.8–30.0)

## 2016-12-15 LAB — ETHANOL: Alcohol, Ethyl (B): <5 mg/dL (ref <5)

## 2016-12-15 NOTE — ED Provider Notes (Signed)
[facility]-EMERGENCY DEPT Provider Note   CSN: [id number] Arrival date & time: 12/15/16  2013     History   Chief Complaint Chief Complaint  Patient presents with  . IVC    HPI Brad Barr is a 41 y.o. male.  The history is provided by the patient. No language interpreter was used.   Brad Barr is a 41 y.o. male who presents to the Emergency Department complaining of IVC.  Patient is here under IVC by his father. He states he is not sure why he was IVCd. He states that he became upset 3 days ago and punched a wall. He denies any SI, HI, hallucinations. He lives at home with his mother and father as well as nephew. He denies any psychiatric history and does not take any psychiatric medications. He does state that he's been hospitalized prior previously for psychiatric issues. Denies any tobacco, alcohol, drug use. Past Medical History:  Diagnosis Date  . ADHD (attention deficit hyperactivity disorder)   . Bipolar 1 disorder (HCC)   . Schizophrenia (HCC)    Pt requesting this be removed.     Patient Active Problem List   Diagnosis Date Noted  . 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 Moderate benzodiazepine use disorder (HCC) 10/17/2016  . Attention deficit hyperactivity disorder (ADHD), predominantly inattentive type   . GAD (generalized anxiety disorder) 11/07/2015  . Stimulant use disorder 11/06/2015    History reviewed. No pertinent surgical history.     Home Medications    Prior to Admission medications   Medication Sig Start Date End Date Taking? Authorizing Provider  benztropine (COGENTIN) 1 MG tablet Take 1 tablet (1 mg total) by mouth 2 (two) times daily. Patient not taking: Reported on 12/15/2016 10/19/16   Beau FannyJohn C Withrow, FNP  citalopram (CELEXA) 20 MG tablet Take 1 tablet (20 mg total) by mouth daily. Patient not taking: Reported on 12/15/2016 10/20/16   Beau FannyJohn C Withrow, FNP  haloperidol (HALDOL) 5 MG tablet Take 1 tablet (5  mg total) by mouth 2 (two) times daily. Patient not taking: Reported on 12/15/2016 10/19/16   Beau FannyJohn C Withrow, FNP  hydrOXYzine (ATARAX/VISTARIL) 25 MG tablet Take 1 tablet (25 mg total) by mouth every 6 (six) hours as needed for anxiety. Patient not taking: Reported on 12/15/2016 10/19/16   Beau FannyJohn C Withrow, FNP  traZODone (DESYREL) 50 MG tablet Take 1 tablet (50 mg total) by mouth at bedtime as needed for sleep. Patient not taking: Reported on 12/15/2016 10/19/16   Beau FannyJohn C Withrow, FNP    Family History Family History  Problem Relation Age of Onset  . Mental illness Other   . Mental illness Sister     Social History Social History  Substance Use Topics  . Smoking status: Former Games developermoker  . Smokeless tobacco: Former NeurosurgeonUser     Comment: Patient uses vapor.  . Alcohol use No     Allergies   Patient has no known allergies.   Review of Systems Review of Systems  All other systems reviewed and are negative.    Physical Exam Updated Vital Signs BP 127/90 (BP Location: Left Arm)   Pulse 87   Temp 97.8 F (36.6 C) (Oral)   Resp 18   SpO2 100%   Physical Exam  Constitutional: He is oriented to person, place, and time. He appears well-developed and well-nourished.  HENT:  Head: Normocephalic and atraumatic.  Cardiovascular: Normal rate and regular rhythm.   Pulmonary/Chest: Effort normal. No respiratory  distress.  Musculoskeletal: Normal range of motion.  Neurological: He is alert and oriented to person, place, and time.  Skin: Skin is warm.  Psychiatric: He has a normal mood and affect. His behavior is normal.  Nursing note and vitals reviewed.    ED Treatments / Results  Labs (all labs ordered are listed, but only abnormal results are displayed) Labs Reviewed  ACETAMINOPHEN LEVEL - Abnormal; Notable for the following:       Result Value   Acetaminophen (Tylenol), Serum <10 (*)    All other components within normal limits  COMPREHENSIVE METABOLIC PANEL  ETHANOL    SALICYLATE LEVEL  CBC  RAPID URINE DRUG SCREEN, HOSP PERFORMED    EKG  EKG Interpretation None       Radiology No results found.  Procedures Procedures (including critical care time)  Medications Ordered in ED Medications - No data to display   Initial Impression / Assessment and Plan / ED Course  I have reviewed the triage vital signs and the nursing notes.  Pertinent labs & imaging results that were available during my care of the patient were reviewed by me and considered in my medical decision making (see chart for details).  Clinical Course     Patient here under IVC. He is without complaints in the emergency department, calm and appropriate.  He has been medically cleared for psychiatric evaluation and treatment.    Final Clinical Impressions(s) / ED Diagnoses   Final diagnoses:  None    New Prescriptions New Prescriptions   No medications on file     Tilden Fossa, MD 12/16/16 0011

## 2016-12-15 NOTE — ED Notes (Addendum)
TTS complete, awaiting disposition. Pt belongings were placed in locker 26. Pt given sandwich and sprite. Aware of plan at this time

## 2016-12-15 NOTE — BH Assessment (Addendum)
Tele Assessment Note   Brad Barr is an 41 y.o. male, who presents involuntarily and unaccompanied to [facility]. Pt was a poor historian during the assessment. Pt reported, he was at [facility] because he hit a wall a few days ago. Per pt's father, the pt is diagnosed with Schizophrenia and Bi-polar and he does not want to accept it so he refuses to take his medication. Pt's father reported, the pt is very aggressive, and dangerous, today he started screaming, hollering, and knocking holes in the walls. Pt's father reported, the pt has not taken his medication in a month and a half. Pt's father reported, he is concerned for his safety as well as his wife and grandson. Pt denied, SI, HI AVH and self-injurious behaviors. Pt denied experiencing depressive symptoms.   Pt's father completed IVC paperwork. Per IVC paperwork: "Respondent Brad Barr suffers from schizophrenia and bipolar disorder. He refuses to take his medication as prescribed by the doctor. Respondent inflicts self-injury whenever he is fearful. He destroys property without regard to personal injury. Brad Barr stuff his ears with cotton in attempts to stop noise. He claims to hear loud sounds that are created by others. Respondent punches holes in the walls, destroys personal belongings and ceiling fans. He refuses to accepted his mental illness, Petition stated the family is in fear of his safety as well as theirs."  Pt denied verbal, physical and sexual abuse. Pt denied substance usage. Pt denied being linked to OPT resources (medication management and counseling. Pt reported, he was admitted to [facility] in 2017 for "anxiety and depression."  Pt quiet/wake in scrubs with logical/cohrent speech. Pt;s mood was relaxed. Pt's affect was congruent with mood. Pt's thought process was coherent/relevant. Pt's judgement was partial. Pt's concentration, insight and impulse control are fair. Pt was oriented x4 (date, year, city and state.) Pt reported if  discharged from [facility] he could contract for safety.   Diagnosis: Bipolar 1 Disorder (HCC)  Past Medical History:  Past Medical History:  Diagnosis Date  . ADHD (attention deficit hyperactivity disorder)   . Bipolar 1 disorder (HCC)   . Schizophrenia (HCC)    Pt requesting this be removed.     History reviewed. No pertinent surgical history.  Family History:  Family History  Problem Relation Age of Onset  . Mental illness Other   . Mental illness Sister     Social History:  reports that he has quit smoking. He has quit using smokeless tobacco. He reports that he does not drink alcohol or use drugs.  Additional Social History:  Alcohol / Drug Use Pain Medications: See MAR Prescriptions: See MAR Over the Counter: See MAR History of alcohol / drug use?: No history of alcohol / drug abuse  CIWA: CIWA-Ar BP: 127/90 Pulse Rate: 87 COWS:    PATIENT STRENGTHS: (choose at least two) Communication skills Supportive family/friends  Allergies: No Known Allergies  Home Medications:  (Not in a hospital admission)  OB/GYN Status:  No LMP for male patient.  General Assessment Data Location of Assessment: [facility] ED TTS Assessment: In system Is this a Tele or Face-to-Face Assessment?: Face-to-Face Is this an Initial Assessment or a Re-assessment for this encounter?: Initial Assessment Marital status: Single Maiden name: NA Is patient pregnant?: No Pregnancy Status: No Living Arrangements: Parent Can pt return to current living arrangement?: Yes Admission Status: Involuntary Referral Source: Self/Family/Friend Insurance type: Self-pay     Crisis Care Plan Living Arrangements: Parent Legal Guardian: Other: (Self) Name of Psychiatrist: NA  Name of Therapist: NA  Education Status Is patient currently in school?: No Current Grade: NA Highest grade of school patient has completed: High School Name of school: NA Contact person: NA  Risk to self with the past 6  months Suicidal Ideation: No (Pt denies. ) Has patient been a risk to self within the past 6 months prior to admission? : No Suicidal Intent: No Has patient had any suicidal intent within the past 6 months prior to admission? : No Is patient at risk for suicide?: No Suicidal Plan?: No Has patient had any suicidal plan within the past 6 months prior to admission? : No Access to Means: No What has been your use of drugs/alcohol within the last 12 months?: Pt denies.  Previous Attempts/Gestures: No How many times?: 0 Other Self Harm Risks: Pt denies. Triggers for Past Attempts: None known Intentional Self Injurious Behavior: None (Pt denies. ) Family Suicide History: Unable to assess Recent stressful life event(s): Other (Comment) (UTA) Persecutory voices/beliefs?: No Depression: No Substance abuse history and/or treatment for substance abuse?: No Suicide prevention information given to non-admitted patients: Not applicable  Risk to Others within the past 6 months Homicidal Ideation: No (Pt denies. ) Does patient have any lifetime risk of violence toward others beyond the six months prior to admission? : No Thoughts of Harm to Others: No Current Homicidal Intent: No Current Homicidal Plan: No Access to Homicidal Means: No Identified Victim: NA History of harm to others?: No Assessment of Violence: None Noted Violent Behavior Description: Na Does patient have access to weapons?: No Criminal Charges Pending?: No Does patient have a court date: No Is patient on probation?: No  Psychosis Hallucinations: None noted Delusions: None noted  Mental Status Report Appearance/Hygiene: In scrubs Eye Contact: Fair Motor Activity: Unremarkable Speech: Logical/coherent Level of Consciousness: Quiet/awake Mood: Other (Comment) (relaxed) Affect: Other (Comment) (congruent with mood. ) Anxiety Level: None Thought Processes: Coherent, Relevant Judgement: Partial Orientation: Other  (Comment) (date, year, city and state.) Obsessive Compulsive Thoughts/Behaviors: None  Cognitive Functioning Concentration: Fair Memory: Recent Intact IQ: Average Insight: Fair Impulse Control: Fair Appetite: Good Weight Loss: 0 Weight Gain: 0 Sleep: Increased Total Hours of Sleep: 8 Vegetative Symptoms: None     Prior Inpatient Therapy Prior Inpatient Therapy: Yes Prior Therapy Dates: 2017 [facility] Reason for Treatment: Pt reported, for anxiety and depression  Prior Outpatient Therapy Prior Outpatient Therapy: No Prior Therapy Dates: NA Prior Therapy Facilty/Provider(s): NA Reason for Treatment: NA Does patient have an ACCT team?: No Does patient have Intensive In-House Services?  : No Does patient have [facility] services? : No Does patient have P4CC services?: No  ADL Screening (condition at time of admission) Is the patient deaf or have difficulty hearing?: No Does the patient have difficulty seeing, even when wearing glasses/contacts?: Yes (Pt reported, wearing glasses. ) Does the patient have difficulty concentrating, remembering, or making decisions?: No Does the patient have difficulty dressing or bathing?: No Does the patient have difficulty walking or climbing stairs?: No Weakness of Legs: None Weakness of Arms/Hands: None       Abuse/Neglect Assessment (Assessment to be complete while patient is alone) Physical Abuse: Denies (Pt denies. ) Verbal Abuse: Denies (Pt denies. ) Sexual Abuse: Denies (Pt denies. )     Advance Directives (For Healthcare) Does Patient Have a Medical Advance Directive?: No    Additional Information 1:1 In Past 12 Months?: No CIRT Risk: No Elopement Risk: No Does patient have medical clearance?:  Yes     Disposition: Nira Conn, NP recommends AM Psychiatric Evaluation. Disposition discussed with Dr. Madilyn Hook and Tresa Endo, RN.  Disposition Initial Assessment Completed for this Encounter:  Yes Disposition of Patient: Other dispositions (Pending NP review. ) Other disposition(s): Other (Comment) (Pending NP review.)  Brad Barr 12/16/2016 12:18 AM   Brad Passe, MS, [facility], [facility] Triage Specialist [phone]

## 2016-12-15 NOTE — ED Triage Notes (Signed)
Pt escorted by police here involuntarily. Pt IVCd by father. IVC papers state pt "suffers from schizophrenia and bipolar disorder. He refuses to take medication as prescribed by the doctor. Respondent inflicts self-injury whenever he is fearful. He destroys property without regard to personal injury. Brad Barr stuffs in his ears with cotton in attempt to stop noise. He claims to hear loud sounds that are created by others. Respondent punches holes in walls, destroys personal belongings, and ceiling fan. He refuses to accept his mental illness. Petitioner stated the family is in fear of his safety as well as theirs."

## 2016-12-16 ENCOUNTER — Inpatient Hospital Stay (HOSPITAL_COMMUNITY)
Admission: AD | Admit: 2016-12-16 | Discharge: 2016-12-18 | DRG: 897 | Disposition: A | Discharge status: Home / Self Care | Payer: Federal, State, Local not specified - Other | Attending: Psychiatry | Admitting: Psychiatry

## 2016-12-16 DIAGNOSIS — F1320 Sedative, hypnotic or anxiolytic dependence, uncomplicated: Secondary | ICD-10-CM

## 2016-12-16 DIAGNOSIS — F411 Generalized anxiety disorder: Secondary | ICD-10-CM | POA: Diagnosis present

## 2016-12-16 DIAGNOSIS — F3160 Bipolar disorder, current episode mixed, unspecified: Secondary | ICD-10-CM | POA: Diagnosis present

## 2016-12-16 DIAGNOSIS — Z87891 Personal history of nicotine dependence: Secondary | ICD-10-CM | POA: Diagnosis not present

## 2016-12-16 DIAGNOSIS — F900 Attention-deficit hyperactivity disorder, predominantly inattentive type: Secondary | ICD-10-CM | POA: Diagnosis present

## 2016-12-16 DIAGNOSIS — F1590 Other stimulant use, unspecified, uncomplicated: Secondary | ICD-10-CM | POA: Diagnosis present

## 2016-12-16 DIAGNOSIS — F1994 Other psychoactive substance use, unspecified with psychoactive substance-induced mood disorder: Secondary | ICD-10-CM

## 2016-12-16 DIAGNOSIS — Z9119 Patient's noncompliance with other medical treatment and regimen: Secondary | ICD-10-CM | POA: Diagnosis not present

## 2016-12-16 DIAGNOSIS — Z79899 Other long term (current) drug therapy: Secondary | ICD-10-CM | POA: Diagnosis not present

## 2016-12-16 DIAGNOSIS — F909 Attention-deficit hyperactivity disorder, unspecified type: Secondary | ICD-10-CM | POA: Diagnosis present

## 2016-12-16 DIAGNOSIS — Z818 Family history of other mental and behavioral disorders: Secondary | ICD-10-CM | POA: Diagnosis not present

## 2016-12-16 DIAGNOSIS — F1510 Other stimulant abuse, uncomplicated: Secondary | ICD-10-CM | POA: Diagnosis present

## 2016-12-16 DIAGNOSIS — F319 Bipolar disorder, unspecified: Secondary | ICD-10-CM | POA: Diagnosis present

## 2016-12-16 DIAGNOSIS — Z9114 Patient's other noncompliance with medication regimen: Secondary | ICD-10-CM | POA: Diagnosis not present

## 2016-12-16 DIAGNOSIS — F209 Schizophrenia, unspecified: Secondary | ICD-10-CM | POA: Diagnosis present

## 2016-12-16 DIAGNOSIS — F3163 Bipolar disorder, current episode mixed, severe, without psychotic features: Secondary | ICD-10-CM

## 2016-12-16 DIAGNOSIS — R45850 Homicidal ideations: Secondary | ICD-10-CM

## 2016-12-16 MED ORDER — HYDROXYZINE HCL 25 MG PO TABS
25.0000 mg | ORAL_TABLET | Freq: Three times a day (TID) | ORAL | Status: DC | PRN
  Administered 2016-12-17: 25 mg via ORAL
  Filled 2016-12-16: qty 1
  Filled 2016-12-16: qty 10

## 2016-12-16 MED ORDER — ALUM & MAG HYDROXIDE-SIMETH 200-200-20 MG/5ML PO SUSP: 30.0000 mL | ORAL | Status: DC | PRN

## 2016-12-16 MED ORDER — ACETAMINOPHEN 325 MG PO TABS: 650.0000 mg | ORAL_TABLET | Freq: Four times a day (QID) | ORAL | Status: DC | PRN

## 2016-12-16 MED ORDER — CITALOPRAM HYDROBROMIDE 20 MG PO TABS
20.0000 mg | ORAL_TABLET | Freq: Every day | ORAL | Status: DC
  Administered 2016-12-17 – 2016-12-18 (×2): 20 mg via ORAL
  Filled 2016-12-16 (×2): qty 1
  Filled 2016-12-16: qty 7
  Filled 2016-12-16: qty 1

## 2016-12-16 MED ORDER — CITALOPRAM HYDROBROMIDE 10 MG PO TABS
20.0000 mg | ORAL_TABLET | Freq: Every day | ORAL | Status: DC
  Administered 2016-12-16: 20 mg via ORAL
  Filled 2016-12-16: qty 2

## 2016-12-16 MED ORDER — CARBAMAZEPINE 200 MG PO TABS
200.0000 mg | ORAL_TABLET | Freq: Two times a day (BID) | ORAL | Status: DC
  Filled 2016-12-16: qty 1

## 2016-12-16 MED ORDER — CARBAMAZEPINE 200 MG PO TABS
200.0000 mg | ORAL_TABLET | Freq: Two times a day (BID) | ORAL | Status: DC
  Administered 2016-12-17 – 2016-12-18 (×3): 200 mg via ORAL
  Filled 2016-12-16 (×2): qty 14
  Filled 2016-12-16 (×5): qty 1

## 2016-12-16 MED ORDER — MAGNESIUM HYDROXIDE 400 MG/5ML PO SUSP: 30.0000 mL | Freq: Every day | ORAL | Status: DC | PRN

## 2016-12-16 MED ORDER — HYDROXYZINE HCL 25 MG PO TABS
25.0000 mg | ORAL_TABLET | Freq: Three times a day (TID) | ORAL | Status: DC | PRN
Start: 2016-12-16 — End: 2016-12-16

## 2016-12-16 NOTE — ED Notes (Signed)
Introduced self to patient. Pt oriented to unit expectations.  Assessed pt for:  A) Anxiety &/or agitation: On admission to the [facility] pt is calm and cooperative, and does appear to be anxious or angry. He denies SI/HI, AVH. He is polite with staff.   S) Safety: Safety maintained with q-15-minute checks and hourly rounds by staff.  A) ADLs: Pt able to perform ADLs independently.  P) Pick-Up (room cleanliness): Pt's room clean and free of clutter.

## 2016-12-16 NOTE — Consult Note (Signed)
[facility] Consult   Reason for Consult:  Homicidal threat Referring Physician:  EDP Patient Identification: Brad Barr MRN:  [id number] Principal Diagnosis: Bipolar 1 disorder, mixed, severe ([place]) Diagnosis:   Patient Active Problem List   Diagnosis Date Noted  . 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 Bipolar 1 disorder, mixed, severe ([place]) [F31.63] 12/16/2016  . 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 GAD (generalized anxiety disorder) [F41.1] 11/07/2015    Total Time spent with patient: 45 minutes  Subjective:   Brad Barr is a 41 y.o. male patient "had a bad day."  HPI:  41 yo male who presented under IVC by his family for destroying property, aggression, and threats to hurt them.  He is suppose to be taking medications for his bipolar disorder but has not since last year.  Brad Barr only reports taking Celexa despite having bipolar.  Denies substance abuse but admits to anger and difficulty managing his  Mood.  Past Psychiatric History: bipolar disorder  Risk to Self: Suicidal Ideation: No (Pt denies. ) Suicidal Intent: No Is patient at risk for suicide?: No Suicidal Plan?: No Access to Means: No What has been your use of drugs/alcohol within the last 12 months?: Pt denies.  How many times?: 0 Other Self Harm Risks: Pt denies. Triggers for Past Attempts: None known Intentional Self Injurious Behavior: None (Pt denies. ) Risk to Others: Homicidal Ideation: No (Pt denies. ) Thoughts of Harm to Others: No Current Homicidal Intent: No Current Homicidal Plan: No Access to Homicidal Means: No Identified Victim: NA History of harm to others?: No Assessment of Violence: None Noted Violent Behavior Description: Na Does patient have  access to weapons?: No Criminal Charges Pending?: No Does patient have a court date: No Prior Inpatient Therapy: Prior Inpatient Therapy: Yes Prior Therapy Dates: 2017 Prior [facility] Facilty[facility]Provider([facility] Reason for Treatment: Pt reported, for anxiety and depression Prior Outpatient Therapy: Prior Outpatient Therapy: No Prior Therapy Dates: NA Prior Therapy Facilty/Provider(s): NA Reason for Treatment: NA Does patient have an ACCT team?: No Does patient have Intensive In-House Services?  : No Does patient have [facility] services? : No Does patient have P4CC services?: No  Past Medical History:  Past Medical History:  Diagnosis Date  . ADHD (attention deficit hyperactivity disorder)   . Bipolar 1 disorder ([place])   . Schizophrenia ([place])    Pt requesting this be removed.    History reviewed. No pertinent surgical history. Family History:  Family History  Problem Relation Age of Onset  . Mental illness Other   . Mental illness Sister    Family Psychiatric  History: none Social History:  History  Alcohol Use No     History  Drug Use No    Social History   Social History  . Marital status: Single    Spouse name: N/A  . Number of children: N/A  . Years of education: N/A   Social History Main Topics  . Smoking status: Former Research scientist (life sciences)  . Smokeless tobacco: Former Systems developer     Comment: Patient uses vapor.  . Alcohol use No  . Drug use: No  . Sexual activity: No   Other Topics Concern  . None   Social History Narrative  . None   Additional Social History:    Allergies:  No Known  Allergies  Labs:  Results for orders placed or performed during the hospital encounter of 12/15/16 (from the past 48 hour(s))  Rapid urine drug screen (hospital performed)     Status: None   Collection Time: 12/15/16  8:33 PM  Result Value Ref Range   Opiates NONE DETECTED NONE DETECTED   Cocaine NONE DETECTED NONE DETECTED   Benzodiazepines NONE DETECTED NONE DETECTED    Amphetamines NONE DETECTED NONE DETECTED   Tetrahydrocannabinol NONE DETECTED NONE DETECTED   Barbiturates NONE DETECTED NONE DETECTED    Comment:        DRUG SCREEN FOR MEDICAL PURPOSES ONLY.  IF CONFIRMATION IS NEEDED FOR ANY PURPOSE, NOTIFY LAB WITHIN 5 DAYS.        LOWEST DETECTABLE LIMITS FOR URINE DRUG SCREEN Drug Class       Cutoff (ng/mL) Amphetamine      1000 Barbiturate      200 Benzodiazepine   979 Tricyclics       892 Opiates          300 Cocaine          300 THC              50   Comprehensive metabolic panel     Status: None   Collection Time: 12/15/16  9:01 PM  Result Value Ref Range   Sodium 135 135 - 145 mmol/L   Potassium 4.0 3.5 - 5.1 mmol/L   Chloride 102 101 - 111 mmol/L   CO2 25 22 - 32 mmol/L   Glucose, Bld 93 65 - 99 mg/dL   BUN 14 6 - 20 mg/dL   Creatinine, Ser 0.72 0.61 - 1.24 mg/dL   Calcium 9.5 8.9 - 10.3 mg/dL   Total Protein 7.8 6.5 - 8.1 g/dL   Albumin 4.9 3.5 - 5.0 g/dL   AST 23 15 - 41 U/L   ALT 37 17 - 63 U/L   Alkaline Phosphatase 68 38 - 126 U/L   Total Bilirubin 0.8 0.3 - 1.2 mg/dL   GFR calc non Af Amer >60 >60 mL/min   GFR calc Af Amer >60 >60 mL/min    Comment: (NOTE) The eGFR has been calculated using the CKD EPI equation. This calculation has not been validated in all clinical situations. eGFR's persistently <60 mL/min signify possible Chronic Kidney Disease.    Anion gap 8 5 - 15  Ethanol     Status: None   Collection Time: 12/15/16  9:01 PM  Result Value Ref Range   Alcohol, Ethyl (B) <5 <5 mg/dL    Comment:        LOWEST DETECTABLE LIMIT FOR SERUM ALCOHOL IS 5 mg/dL FOR MEDICAL PURPOSES ONLY   Salicylate level     Status: None   Collection Time: 12/15/16  9:01 PM  Result Value Ref Range   Salicylate Lvl <1.1 2.8 - 30.0 mg/dL  Acetaminophen level     Status: Abnormal   Collection Time: 12/15/16  9:01 PM  Result Value Ref Range   Acetaminophen (Tylenol), Serum <10 (L) 10 - 30 ug/mL    Comment:         THERAPEUTIC CONCENTRATIONS VARY SIGNIFICANTLY. A RANGE OF 10-30 ug/mL MAY BE AN EFFECTIVE CONCENTRATION FOR MANY PATIENTS. HOWEVER, SOME ARE BEST TREATED AT CONCENTRATIONS OUTSIDE THIS RANGE. ACETAMINOPHEN CONCENTRATIONS >150 ug/mL AT 4 HOURS AFTER INGESTION AND >50 ug/mL AT 12 HOURS AFTER INGESTION ARE OFTEN ASSOCIATED WITH TOXIC REACTIONS.   cbc     Status: None  Collection Time: 12/15/16  9:01 PM  Result Value Ref Range   WBC 6.6 4.0 - 10.5 K/uL   RBC 5.67 4.22 - 5.81 MIL/uL   Hemoglobin 16.0 13.0 - 17.0 g/dL   HCT 45.2 39.0 - 52.0 %   MCV 79.7 78.0 - 100.0 fL   MCH 28.2 26.0 - 34.0 pg   MCHC 35.4 30.0 - 36.0 g/dL   RDW 12.8 11.5 - 15.5 %   Platelets 230 150 - 400 K/uL    Current Facility-Administered Medications  Medication Dose Route Frequency Provider Last Rate Last Dose  . carbamazepine (TEGRETOL) tablet 200 mg  200 mg Oral BID PC Shadoe Bethel, MD      . citalopram (CELEXA) tablet 20 mg  20 mg Oral Daily Yariana Hoaglund, MD   20 mg at 12/16/16 1345  . hydrOXYzine (ATARAX/VISTARIL) tablet 25 mg  25 mg Oral TID PRN Corena Pilgrim, MD       Current Outpatient Prescriptions  Medication Sig Dispense Refill  . benztropine (COGENTIN) 1 MG tablet Take 1 tablet (1 mg total) by mouth 2 (two) times daily. (Patient not taking: Reported on 12/15/2016) 60 tablet 0  . citalopram (CELEXA) 20 MG tablet Take 1 tablet (20 mg total) by mouth daily. (Patient not taking: Reported on 12/15/2016) 30 tablet 0  . haloperidol (HALDOL) 5 MG tablet Take 1 tablet (5 mg total) by mouth 2 (two) times daily. (Patient not taking: Reported on 12/15/2016) 60 tablet 0  . hydrOXYzine (ATARAX/VISTARIL) 25 MG tablet Take 1 tablet (25 mg total) by mouth every 6 (six) hours as needed for anxiety. (Patient not taking: Reported on 12/15/2016) 60 tablet 0  . traZODone (DESYREL) 50 MG tablet Take 1 tablet (50 mg total) by mouth at bedtime as needed for sleep. (Patient not taking: Reported on 12/15/2016) 30 tablet 0     Musculoskeletal: Strength & Muscle Tone: within normal limits Gait & Station: normal Patient leans: N/A  Psychiatric Specialty Exam: Physical Exam  Constitutional: He is oriented to person, place, and time. He appears well-developed and well-nourished.  HENT:  Head: Normocephalic.  Neck: Normal range of motion.  Respiratory: Effort normal.  Musculoskeletal: Normal range of motion.  Neurological: He is alert and oriented to person, place, and time.  Psychiatric: His speech is normal and behavior is normal. His mood appears anxious. His affect is labile. Thought content is paranoid. Cognition and memory are normal. He expresses impulsivity. He exhibits a depressed mood.    Review of Systems  Psychiatric/Behavioral: Positive for depression. The patient is nervous/anxious.   All other systems reviewed and are negative.   Blood pressure 96/64, pulse (!) 59, temperature 98 F (36.7 C), temperature source Oral, resp. rate 16, SpO2 93 %.There is no height or weight on file to calculate BMI.  General Appearance: Disheveled  Eye Contact:  Fair  Speech:  Normal Rate  Volume:  Normal  Mood:  Anxious, Depressed and Irritable  Affect:  Congruent  Thought Process:  Coherent and Descriptions of Associations: Intact  Orientation:  Full (Time, Place, and Person)  Thought Content:  Rumination  Suicidal Thoughts:  No  Homicidal Thoughts:  Yes.  without intent/plan  Memory:  Immediate;   Fair Recent;   Fair Remote;   Fair  Judgement:  Poor  Insight:  Lacking  Psychomotor Activity:  Normal  Concentration:  Concentration: Fair and Attention Span: Fair  Recall:  [employer] of Knowledge:  Fair  Language:  Good  Akathisia:  No  Handed:  Right  AIMS (if indicated):     Assets:  Housing Leisure Time Physical Health Resilience  ADL's:  Intact  Cognition:  WNL  Sleep:        Treatment Plan Summary: Daily contact with patient to assess and evaluate symptoms and progress in treatment,  Medication management and Plan bipolar affective disorder, mixed, severe without psychosis:  -Crisis stabilization -Medication management:  Start Tegretol 200 mg BID for mood, Vistaril 25 mg TID for anxiety PRN, and Celexa 20 mg daily for depression -Individual counseling  Disposition: Recommend psychiatric Inpatient admission when medically cleared.  Waylan Boga, NP 12/16/2016 5:55 PM  Patient seen face-to-face for psychiatric evaluation, chart reviewed and case discussed with the physician extender and developed treatment plan. Reviewed the information documented and agree with the treatment plan. Corena Pilgrim, MD

## 2016-12-16 NOTE — Progress Notes (Signed)
Report received from admitting RN.  Introduced self to pt.  Pt denies SI/HI, denies hallucinations, denies pain.  He verbally contracts for safety and reports that he will inform staff of needs and concerns.  Pt is safe on the unit.  Will continue to monitor and assess.

## 2016-12-16 NOTE — BH Assessment (Signed)
[name]  Per Thedore MinsMojeed Akintayo, MD, this pt requires psychiatric hospitalization.  Berneice Heinrichina Tate, RN, [facility].  Pt presents under IVC initiated by his father, and upheld by Dr Jannifer FranklinAkintayo, and [facility].  Pt's nurse, Diane, has been notified, and agrees to call report to [phone].  Pt is to be transported via Patent examinerlaw enforcement.   Doylene Canninghomas Filiberto Wamble, MA Triage Specialist [phone]

## 2016-12-16 NOTE — ED Notes (Signed)
Pt resting at present, no distress noted, calm & cooperative.  Monitoring for safety, Q 15 min checks in effect. 

## 2016-12-16 NOTE — Progress Notes (Signed)
12/16/16 1359:  LRT introduced self to pt and offered activities.  Pt stated he was not interested at this time.  Caroll RancherMarjette Laporchia Nakajima, LRT/CTRS

## 2016-12-17 ENCOUNTER — Encounter (HOSPITAL_COMMUNITY): Payer: Self-pay

## 2016-12-17 DIAGNOSIS — F1994 Other psychoactive substance use, unspecified with psychoactive substance-induced mood disorder: Principal | ICD-10-CM

## 2016-12-17 DIAGNOSIS — F1320 Sedative, hypnotic or anxiolytic dependence, uncomplicated: Secondary | ICD-10-CM

## 2016-12-17 DIAGNOSIS — F900 Attention-deficit hyperactivity disorder, predominantly inattentive type: Secondary | ICD-10-CM

## 2016-12-17 DIAGNOSIS — Z818 Family history of other mental and behavioral disorders: Secondary | ICD-10-CM

## 2016-12-17 DIAGNOSIS — Z79899 Other long term (current) drug therapy: Secondary | ICD-10-CM

## 2016-12-17 NOTE — [facility] Suicide Risk Assessment (Signed)
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 GAD (generalized anxiety disorder) [F41.1] 11/07/2015  . 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   Continued Clinical Symptoms:  Alcohol Use Disorder Identification Test Final Score (AUDIT): 0 The "Alcohol Use Disorders Identification Test", Guidelines for Use in Primary Care, Second Edition.  World Science writerHealth Organization [facility](WHO). Score between 0-7:  no or low risk or alcohol related problems. Score between 8-15:  moderate risk of alcohol related problems. Score between 16-19:  high risk of alcohol related problems. Score 20 or above:  warrants further diagnostic evaluation for alcohol dependence and treatment.   CLINICAL FACTORS:   Severe Anxiety and/or Agitation Alcohol/Substance Abuse/Dependencies   Musculoskeletal: Strength & Muscle Tone: within normal limits Gait & Station: normal Patient leans: N/A  Psychiatric Specialty Exam: Physical Exam  Review of Systems  Psychiatric/Behavioral: The patient is nervous/anxious.   All other systems reviewed and are negative.   Blood pressure 114/75, pulse 62, temperature 98.4 F (36.9 C), resp. rate 16, height 6\' 2"  (1.88 m), weight 96.2 kg (212 lb).Body mass index is 27.22 kg/m.            Please  see H&P.                                               COGNITIVE FEATURES THAT CONTRIBUTE TO RISK:  Closed-mindedness, Polarized thinking and Thought constriction (tunnel vision)    SUICIDE RISK:   Mild:  Suicidal ideation of limited frequency, intensity, duration, and specificity.  There are no identifiable plans, no associated intent, mild dysphoria and related symptoms, good self-control (both objective and subjective assessment), few other risk factors, and identifiable protective factors, including available and accessible social support.   PLAN OF CARE: Please see H&P.   I certify that inpatient services furnished can reasonably be expected to improve the patient's condition.  Ameah Chanda, MD 12/17/2016, 1:13 PM

## 2016-12-17 NOTE — Progress Notes (Signed)
DAR NOTE: Patient presents with anxious affect and  mood.  Denies pain, auditory and visual hallucinations.  Described energy level as normal and concentration as good.  Rates depression at 0, hopelessness at 0, and anxiety at 0.  Maintained on routine safety checks.  Medications given as prescribed.  Support and encouragement offered as needed.  Attended group and participated.  States goal for today is "having a good day."  Patient remained in his room most of this shift.  Offered no complaint.

## 2016-12-17 NOTE — H&P (Signed)
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 GAD (generalized anxiety disorder) [F41.1] 11/07/2015  . 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    Per initial notes in EHR : ' Nate Mella is an 41 y.o. male, who presents involuntarily and unaccompanied to [facility]. Pt was a poor historian during the assessment. Pt reported, he was at [facility] because he hit a wall a few days ago. Per pt's father, the pt is diagnosed with Schizophrenia and Bi-polar and he does not want to accept it so he refuses to take his medication. Pt's father reported, the pt is very aggressive, and dangerous, today he started screaming, hollering, and knocking holes in the walls. Pt's father reported, the pt has not taken his medication in a month and a half. Pt's father reported, he is concerned for his safety as well as his wife and grandson. Pt denied, SI, HI AVH and self-injurious behaviors. Pt denied experiencing depressive symptoms. Pt's father completed IVC paperwork. Per IVC paperwork: "Respondent Dawsen Krieger suffers from schizophrenia and bipolar disorder. He refuses to take his medication as prescribed by the doctor.  Respondent inflicts self-injury whenever he is fearful. He destroys property without regard to personal injury. Terique Hallett stuff his ears with cotton in attempts to stop noise. He claims to hear loud sounds that are created by others. Respondent punches holes in the walls, destroys personal belongings and ceiling fans. He refuses to accepted his mental illness, Petition stated the family is in fear of his safety as well as theirs."Pt denied verbal, physical and sexual abuse. Pt denied substance usage. Pt denied being linked to OPT resources (medication management and counseling. Pt reported, he was admitted to [facility] in 2017 for "anxiety and depression."  Patient seen and chart reviewed TODAY .Patient appears calm , pleasant , no sign of acting out or irritability noted. Pt reports he is here since he was frustrated and punched a wall. Pt reports he is anxious about his life , wants to move out of family home and be on his own . Pt however reports he does not have a job and no income at this time. Pt reports he is willing to take the medications prescribed . Pt was started on tegretol on admission for irritability/acting out.  Per previous collateral information obtained from family - pt has hx of abusing xanax and stimulants . Pt also has agreed to this in the past. Pt however lacks insight and this admission denies that he abuses anything. Pt attempts to minimize his sx.   Pt denies any other concerns.  Associated Signs/Symptoms: Depression Symptoms:  denies (Hypo) Manic Symptoms:  Impulsivity, Anxiety Symptoms:  Excessive Worry, Psychotic Symptoms:  denies on admission PTSD Symptoms: Negative Total Time spent with patient:  45 minutes  Past Psychiatric History: Hx of several admissions to [name] for substance induced mood sx as well as substance abuse. Pt is noncompliant with out patient care and medications.Pt denies any suicide attempts Is the patient at risk to self? Yes.   pt was  aggressive at home Has the patient been a risk to self in the past 6 months? Yes.    Has the patient been a risk to self within the distant past? Yes.    Is the patient a risk to others? Yes.    Has the patient been a risk to others in the past 6 months? Yes.    Has the patient been a risk to others within the distant past? Yes.     Prior Inpatient Therapy:   Prior Outpatient Therapy:    Alcohol Screening: 1. How often do you have a drink containing alcohol?: Never 9. Have you or someone else been injured as a result of your drinking?: No 10. Has a relative or friend or a doctor or another health worker been concerned about your drinking or suggested you cut down?: No Alcohol Use Disorder Identification Test Final Score (AUDIT): 0 Brief Intervention: AUDIT score less than 7 or less-screening does not suggest unhealthy drinking-brief intervention not indicated Substance Abuse History in the last 12 months:  Yes.  xanax, stimulants  Consequences of Substance Abuse: Family Consequences:  relational issues Previous Psychotropic Medications: Yes adderall, xanax  Psychological Evaluations: No  Past Medical History:  Past Medical History:  Diagnosis Date  . ADHD (attention deficit hyperactivity disorder)   . Bipolar 1 disorder ([place])   . Schizophrenia ([place])    Pt requesting this be removed.    History reviewed. No pertinent surgical history. Family History: denies hx of HTN, DM Family History  Problem Relation Age of Onset  . Mental illness Other   . Mental illness Sister    Family Psychiatric  History: Pls see above, sister also abuses drugs Tobacco Screening: Have you used any form of tobacco in the last 30 days? (Cigarettes, Smokeless Tobacco, Cigars, and/or Pipes): No Social History:  single, lives with parents who are supportive. History  Alcohol Use No     History  Drug Use No    Additional Social History:      Pain Medications: See MAR Prescriptions: See MAR Over the  Counter: See MAR History of alcohol / drug use?: No history of alcohol / drug abuse Longest period of sobriety (when/how long): denies current usage Negative Consequences of Use: Personal relationships Withdrawal Symptoms: Other (Comment)                    Allergies:  No Known Allergies Lab Results:  Results for orders placed or performed during the hospital encounter of 12/15/16 (from the past 48 hour(s))  Rapid urine drug screen (hospital performed)     Status: None   Collection Time: 12/15/16  8:33 PM  Result Value Ref Range   Opiates NONE DETECTED NONE DETECTED   Cocaine NONE DETECTED NONE DETECTED   Benzodiazepines NONE DETECTED NONE DETECTED   Amphetamines NONE DETECTED NONE DETECTED   Tetrahydrocannabinol NONE DETECTED NONE DETECTED   Barbiturates NONE DETECTED NONE DETECTED    Comment:        DRUG SCREEN FOR MEDICAL PURPOSES ONLY.  IF CONFIRMATION IS NEEDED FOR ANY PURPOSE, NOTIFY LAB WITHIN 5 DAYS.        LOWEST DETECTABLE LIMITS FOR URINE DRUG SCREEN Drug Class  Cutoff (ng/mL) Amphetamine      1000 Barbiturate      200 Benzodiazepine   939 Tricyclics       030 Opiates          300 Cocaine          300 THC              50   Comprehensive metabolic panel     Status: None   Collection Time: 12/15/16  9:01 PM  Result Value Ref Range   Sodium 135 135 - 145 mmol/L   Potassium 4.0 3.5 - 5.1 mmol/L   Chloride 102 101 - 111 mmol/L   CO2 25 22 - 32 mmol/L   Glucose, Bld 93 65 - 99 mg/dL   BUN 14 6 - 20 mg/dL   Creatinine, Ser 0.72 0.61 - 1.24 mg/dL   Calcium 9.5 8.9 - 10.3 mg/dL   Total Protein 7.8 6.5 - 8.1 g/dL   Albumin 4.9 3.5 - 5.0 g/dL   AST 23 15 - 41 U/L   ALT 37 17 - 63 U/L   Alkaline Phosphatase 68 38 - 126 U/L   Total Bilirubin 0.8 0.3 - 1.2 mg/dL   GFR calc non Af Amer >60 >60 mL/min   GFR calc Af Amer >60 >60 mL/min    Comment: (NOTE) The eGFR has been calculated using the CKD EPI equation. This calculation has not been validated in  all clinical situations. eGFR's persistently <60 mL/min signify possible Chronic Kidney Disease.    Anion gap 8 5 - 15  Ethanol     Status: None   Collection Time: 12/15/16  9:01 PM  Result Value Ref Range   Alcohol, Ethyl (B) <5 <5 mg/dL    Comment:        LOWEST DETECTABLE LIMIT FOR SERUM ALCOHOL IS 5 mg/dL FOR MEDICAL PURPOSES ONLY   Salicylate level     Status: None   Collection Time: 12/15/16  9:01 PM  Result Value Ref Range   Salicylate Lvl <0.9 2.8 - 30.0 mg/dL  Acetaminophen level     Status: Abnormal   Collection Time: 12/15/16  9:01 PM  Result Value Ref Range   Acetaminophen (Tylenol), Serum <10 (L) 10 - 30 ug/mL    Comment:        THERAPEUTIC CONCENTRATIONS VARY SIGNIFICANTLY. A RANGE OF 10-30 ug/mL MAY BE AN EFFECTIVE CONCENTRATION FOR MANY PATIENTS. HOWEVER, SOME ARE BEST TREATED AT CONCENTRATIONS OUTSIDE THIS RANGE. ACETAMINOPHEN CONCENTRATIONS >150 ug/mL AT 4 HOURS AFTER INGESTION AND >50 ug/mL AT 12 HOURS AFTER INGESTION ARE OFTEN ASSOCIATED WITH TOXIC REACTIONS.   cbc     Status: None   Collection Time: 12/15/16  9:01 PM  Result Value Ref Range   WBC 6.6 4.0 - 10.5 K/uL   RBC 5.67 4.22 - 5.81 MIL/uL   Hemoglobin 16.0 13.0 - 17.0 g/dL   HCT 45.2 39.0 - 52.0 %   MCV 79.7 78.0 - 100.0 fL   MCH 28.2 26.0 - 34.0 pg   MCHC 35.4 30.0 - 36.0 g/dL   RDW 12.8 11.5 - 15.5 %   Platelets 230 150 - 400 K/uL    Blood Alcohol level:  Lab Results  Component Value Date   [facility] <5 12/15/2016   ETH <5 23/30/0762    Metabolic Disorder Labs:  Lab Results  Component Value Date   HGBA1C 5.3 10/18/2016   MPG 105 10/18/2016   MPG 111 11/07/2015   Lab Results  Component Value  Date   PROLACTIN 35.3 (H) 10/18/2016   PROLACTIN 79.3 (H) 02/23/2016   Lab Results  Component Value Date   CHOL 206 (H) 10/18/2016   TRIG 96 10/18/2016   HDL 40 (L) 10/18/2016   CHOLHDL 5.2 10/18/2016   VLDL 19 10/18/2016   LDLCALC 147 (H) 10/18/2016   LDLCALC 116 (H) 11/07/2015     Current Medications: Current Facility-Administered Medications  Medication Dose Route Frequency Provider Last Rate Last Dose  . acetaminophen (TYLENOL) tablet 650 mg  650 mg Oral Q6H PRN Patrecia Pour, NP      . alum & mag hydroxide-simeth (MAALOX/MYLANTA) 200-200-20 MG/5ML suspension 30 mL  30 mL Oral Q4H PRN Patrecia Pour, NP      . carbamazepine (TEGRETOL) tablet 200 mg  200 mg Oral BID PC Patrecia Pour, NP   200 mg at 12/17/16 0810  . citalopram (CELEXA) tablet 20 mg  20 mg Oral Daily Patrecia Pour, NP   20 mg at 12/17/16 0810  . hydrOXYzine (ATARAX/VISTARIL) tablet 25 mg  25 mg Oral TID PRN Patrecia Pour, NP      . magnesium hydroxide (MILK OF MAGNESIA) suspension 30 mL  30 mL Oral Daily PRN Patrecia Pour, NP       PTA Medications: Prescriptions Prior to Admission  Medication Sig Dispense Refill Last Dose  . benztropine (COGENTIN) 1 MG tablet Take 1 tablet (1 mg total) by mouth 2 (two) times daily. (Patient not taking: Reported on 12/15/2016) 60 tablet 0 Not Taking at Unknown time  . citalopram (CELEXA) 20 MG tablet Take 1 tablet (20 mg total) by mouth daily. (Patient not taking: Reported on 12/15/2016) 30 tablet 0 Not Taking at Unknown time  . haloperidol (HALDOL) 5 MG tablet Take 1 tablet (5 mg total) by mouth 2 (two) times daily. (Patient not taking: Reported on 12/15/2016) 60 tablet 0 Not Taking at Unknown time  . hydrOXYzine (ATARAX/VISTARIL) 25 MG tablet Take 1 tablet (25 mg total) by mouth every 6 (six) hours as needed for anxiety. (Patient not taking: Reported on 12/15/2016) 60 tablet 0 Not Taking at Unknown time  . traZODone (DESYREL) 50 MG tablet Take 1 tablet (50 mg total) by mouth at bedtime as needed for sleep. (Patient not taking: Reported on 12/15/2016) 30 tablet 0 Not Taking at Unknown time    Musculoskeletal: Strength & Muscle Tone: within normal limits Gait & Station: normal Patient leans: N/A  Psychiatric Specialty Exam: Physical Exam    Review of Systems   Psychiatric/Behavioral: The patient is nervous/anxious.   All other systems reviewed and are negative.   Blood pressure 114/75, pulse 62, temperature 98.4 F (36.9 C), resp. rate 16, height 6' 2"  (1.88 m), weight 96.2 kg (212 lb).Body mass index is 27.22 kg/m.  General Appearance: Guarded  Eye Contact:  Fair  Speech:  Normal Rate  Volume:  Decreased  Mood:  Anxious  Affect:  Congruent  Thought Process:  Goal Directed and Descriptions of Associations: Intact  Orientation:  Full (Time, Place, and Person)  Thought Content:  Logical and Rumination   Suicidal Thoughts:  No but was aggressive and disruptive at home  Homicidal Thoughts:  No  Memory:  Immediate;   Fair Recent;   Fair Remote;   Fair  Judgement:  Impaired  Insight:  Shallow  Psychomotor Activity:  Restlessness  Concentration:  Concentration: Fair and Attention Span: Fair  Recall:  [employer] of Knowledge:  Fair  Language:  Fair  Akathisia:  No  Handed:  Right  AIMS (if indicated):     Assets:  Desire for Improvement  ADL's:  Intact  Cognition:  WNL  Sleep:  Number of Hours: 5    Treatment Plan Summary:Epifanio Nunnis a 41 y.o.caucasian male,single , lives in [place] with his parents ,  who presented under IVC, taken out by his father - currently presents as calm - will restart treatment and observe on the unit.    Daily contact with patient to assess and evaluate symptoms and progress in treatment and Medication management   Patient will benefit from inpatient treatment and stabilization.   Estimated length of stay is 5-7 days.   Reviewed past medical records,treatment plan.   For affective sx: Continue Celexa 20 mg po daily.  For mood lability: Tegretol 200 mg po bid. Tegretol level on 12/20/16.  For substance use do : Pt has agreed that he does abuse adderall, xanax - his parents has provided collateral information in the past and verified this. Pt however attempts to minimize it and is not motivated to  get help.   Will continue to monitor vitals ,medication compliance and treatment side effects while patient is here.   Will monitor for medical issues as well as call consult as needed.   Reviewed labs - CBC - WNL, UDS- negative  ,tsh- wnl ( 10/18/16)  CSW will start working on disposition.   Patient to participate in therapeutic milieu .         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    Ursula Alert, MD 1/9/20181:14 PM

## 2016-12-17 NOTE — Progress Notes (Signed)
Recreation Therapy Notes  Animal-Assisted Activity (AAA) Program Checklist/Progress Notes Patient Eligibility Criteria Checklist & Daily Group note for Rec Tx Intervention  Date: 01.09.2018 Time: 2:45pm [address]    AAA/T Program Assumption of Risk Form signed by Patient/ or Parent Legal Guardian No  Clinical Observations/Feedback: Patient discussed with MD for appropriateness in pet therapy session. Both LRT and MD agree patient is appropriate for participation. Patient offered participation in session, but politely declined participation.   Brad Barr, LRT/CTRS         Brad Barr 12/17/2016 3:15 PM

## 2016-12-17 NOTE — Progress Notes (Signed)
Recreation Therapy Notes  Date: 12/17/16 Time: 1000 Location: [address] Day[address]  Group Topic: Self-Esteem  Goal Area(s) Addresses:  Patient will identify positive ways to increase self-esteem. Patient will verbalize benefit of increased self-esteem.  Intervention: Blank masks, markers  Activity: How I see Me.  Patients were given a worksheet with a blank mask on it.  On the mask, patiens were to draw or write how they feel others see them.  On the back of the sheet, patients were to write or draw how they see themselves.  Education:  Self-Esteem, Discharge Planning.   Education Outcome: Acknowledges education/In group clarification offered/Needs additional education  Clinical Observations/Feedback: Pt did not attend group.   Tannis Burstein, LRT/CTRS         Shaqueena Mauceri A 12/17/2016 12:21 PM 

## 2016-12-17 NOTE — [facility] Suicide Risk Assessment (Signed)
[facility] INPATIENT:  Family/Significant Other Suicide Prevention Education  Suicide Prevention Education:  Education Completed; No one has been identified by the patient as the family member/significant other with whom the patient will be residing, and identified as the person(s) who will aid the patient in the event of a mental health crisis (suicidal ideations/suicide attempt).  With written consent from the patient, the family member/significant other has been provided the following suicide prevention education, prior to the and/or following the discharge of the patient.  The suicide prevention education provided includes the following:  Suicide risk factors  Suicide prevention and interventions  National Suicide Hotline telephone number  [facility] assessment telephone number  [facility] Emergency Assistance 911  [facility] and/or Residential Mobile Crisis Unit telephone number  Request made of family/significant other to:  Remove weapons (e.g., guns, rifles, knives), all items previously/currently identified as safety concern.    Remove drugs/medications (over-the-counter, prescriptions, illicit drugs), all items previously/currently identified as a safety concern.  The family member/significant other verbalizes understanding of the suicide prevention education information provided.  The family member/significant other agrees to remove the items of safety concern listed above. The patient did not endorse SI at the time of admission, nor did the patient c/o SI during the stay here.  SPE not required.  Baldo DaubRodney B [facility] 12/17/2016, 2:03 PM

## 2016-12-17 NOTE — Tx Team (Signed)
Initial Treatment Plan 12/17/2016 1:57 AM Brad Barr Julius ZOX:[id number]    PATIENT STRESSORS: Marital or family conflict Medication change or noncompliance   PATIENT STRENGTHS: General fund of knowledge Physical Health   PATIENT IDENTIFIED PROBLEMS: Psychosis  Noncompliance with medications  Violent angry outbursts/actions at home  "I don't know cause I didn't want to come here"  "Get out"             DISCHARGE CRITERIA:  Improved stabilization in mood, thinking, and/or behavior Verbal commitment to aftercare and medication compliance  PRELIMINARY DISCHARGE PLAN: Outpatient therapy Medication management  PATIENT/FAMILY INVOLVEMENT: This treatment plan has been presented to and reviewed with the patient, Brad Barr Franks.  The patient and family have been given the opportunity to ask questions and make suggestions.  Levin BaconHeather V Tehya Leath, RN 12/17/2016, 1:57 AM

## 2016-12-17 NOTE — [facility] Group Notes (Signed)
[facility] LCSW Group Therapy  12/17/2016 1:15 pm  Type of Therapy: Process Group Therapy  Participation Level:  Active  Participation Quality:  Appropriate  Affect:  Flat  Cognitive:  Oriented  Insight:  Improving  Engagement in Group:  Limited  Engagement in Therapy:  Limited  Modes of Intervention:  Activity, Clarification, Education, Problem-solving and Support  Summary of Progress/Problems: Today's group addressed the issue of overcoming obstacles.  Patients were asked to identify their biggest obstacle post d/c that stands in the way of their on-going success, and then problem solve as to how to manage this. Invited.  Chose to not attend.  [name], [place] [facility] 12/17/2016   1:59 PM

## 2016-12-17 NOTE — BHH Counselor (Signed)
Adult Comprehensive Assessment  Patient ID: Brad Barr, male   DOB: 10-30-1976, 41 y.o.   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 UDS is negative   Living/Environment/Situation:  Living Arrangements: Lives with parents  Living conditions (as described by patient or guardian): Good  How long has patient lived in current situation?: 6  months  What is atmosphere in current home: Comfortable, Chaotic  Family History:  Marital status: Single Are you sexually active?: No What is your sexual orientation?: would not discuss Has your sexual activity been affected by drugs, alcohol, medication, or emotional stress?: would not discuss Does patient have children?: No  Childhood History:  By whom was/is the patient raised?: Both parents Description of patient's relationship with caregiver when they were a child: "good" Patient's description of current relationship with people who raised him/her: "they keep putting me in this place-how would you feel?" Does patient have siblings?: Yes Number of Siblings: 2 Description of patient's current relationship with siblings: brother and sister, doesnt see much of them Did patient suffer any verbal/emotional/physical/sexual abuse as a child?: No Did patient suffer from severe childhood neglect?: No Has patient ever been sexually abused/assaulted/raped as an adolescent or adult?: No Was  the patient ever a victim of a crime or a disaster?: No Witnessed domestic violence?: No Has patient been effected by domestic violence as an adult?: No  Education:  Highest grade of school patient has completed: high school graduate Currently a Consulting civil engineer?: No Learning disability?: No  Employment/Work Situation:  Employment situation: Employed 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 How long has patient been employed?: 2007 Patient's job has been impacted by current illness: No What is the longest time patient has a held a job?: 8 years 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 Has patient ever been in the [employer]?: No Has patient ever served in combat?: No Did You Receive Any Psychiatric Treatment/Services While in Equities trader?: No Are There Guns or Other Weapons in Your Home?: No  Financial Resources:  Financial resources: Income from employment, Support from parents/caregivers Does patient have a representative payee or guardian?: No  Alcohol/Substance Abuse:  What has been your use of drugs/alcohol within the last 12 months?: denies all current and former use of drugs and alcohol  UDS negative If attempted suicide, did drugs/alcohol play a role in this?: No Alcohol/Substance Abuse Treatment Hx: Denies past history Has alcohol/substance abuse ever caused legal problems?: No  Social Support System: Forensic psychologist System: Poor Describe Community Support System: "I dont have any friends" Type of faith/religion: Ephriam Knuckles How does patient's faith help to cope with current illness?: reads Bible and attends church  Leisure/Recreation:  Leisure and Hobbies: reading Bible  Strengths/Needs:  What things does the patient do well?: "I dont know" In what areas does patient struggle / problems for patient: "I dont know"  Discharge Plan:  Does patient have access to transportation?: Yes Will patient  be returning to same living situation after discharge?: Yes Currently receiving community  mental health services: No If no, would patient like referral for services when discharged?: Yes (What county?) ([facility] Does patient have financial barriers related to discharge medications?: No (despite lack of insurance, pt denies issues with affording medications)    Summary/Recommendations:   Summary and Recommendations (to be completed by the evaluator): Brad Barr is a 41 YO Caucasian male diagnosed with substance or medication induced Bipolar D/O.  Per usual, he was IVC'd by parents for erratic, threatening and destructive behavior.  Per usual, he minimizes his behavior, stating that  "it is overblown" and "a family issue." Plans tor return home with parents at d/c, [facility]  In the meantime, Brad Barr can benefit from crises stabilization, medication management, therapeutic milieu and referral for services.   Brad Barr. 12/17/2016

## 2016-12-17 NOTE — Progress Notes (Signed)
Recreation Therapy Notes  INPATIENT RECREATION THERAPY ASSESSMENT  Patient Details Name: Shary KeyJohnny Eckerman MRN: [id number] DOB: 1976-07-15 Today's Date: 12/17/2016  Patient Stressors: Family  Pt stated he was here because he was IVC'd.  Coping Skills:   Exercise  Personal Challenges:  (Pt did not identify any personal challenges.)  Leisure Interests (2+):  Individual - Reading, Individual - TV  Awareness of Community Resources:  Yes  Community Resources:  [place]  Current Use: Yes  Patient Strengths:  Good person; Honest  Patient Identified Areas of Improvement:  "I don't think I do"  Current Recreation Participation:  "Often, depending on what it is"  Patient Goal for Hospitalization:  "Get home"  [place]  [place] Residence[place]  Current [place] (including self-harm):  No  Current HI:  No  Consent to Intern Participation: N/A   Caroll RancherMarjette Alec Jaros, LRT/CTRS  Caroll RancherLindsay, Remell Giaimo A 12/17/2016, 12:35 PM

## 2016-12-17 NOTE — Progress Notes (Signed)
Brad BerkshireJohnny is a 41 year old male being admitted involuntarily to [facility] from [facility].  Brad Barr came to the ED under IVC initiated by his parents for refusing to take medications, aggressive, screaming and kicking holes in walls.  He has a history of Schizophrenia and Bipolar disorder.  He denies any SI/HI or A/V hallucinations.  He has not taken medication in 1.5 months.  He is diagnosed with Bipolar I Disorder.   He denies any medical issues and appears to be in no physical distress.  He denies any SI/HI or A/V hallucinations.  He is wearing ear plugs to "help with the noise."  He did report that he punched a hole in the wall at home but wouldn't talk about anything else.  Oriented him to the unit.  Admission paperwork completed and signed.  Belongings searched and secured in locker # 27.  Skin assessment completed and no skin issues noted.  Q 15 minute checks initiated for safety.  We will monitor the progress towards his goals.

## 2016-12-18 MED ORDER — HYDROXYZINE HCL 25 MG PO TABS: 25.0000 mg | ORAL_TABLET | Freq: Three times a day (TID) | ORAL | 0 refills | Status: DC | PRN

## 2016-12-18 MED ORDER — NAPROXEN 375 MG PO TABS
375.0000 mg | ORAL_TABLET | Freq: Two times a day (BID) | ORAL | Status: DC
  Administered 2016-12-18: 375 mg via ORAL
  Filled 2016-12-18 (×4): qty 1

## 2016-12-18 MED ORDER — LIDOCAINE 5 % EX PTCH
1.0000 | MEDICATED_PATCH | CUTANEOUS | Status: DC
  Administered 2016-12-18: 1 via TRANSDERMAL
  Filled 2016-12-18 (×2): qty 1

## 2016-12-18 MED ORDER — NAPROXEN 375 MG PO TABS
ORAL_TABLET | ORAL | Status: AC
  Filled 2016-12-18: qty 1

## 2016-12-18 MED ORDER — CITALOPRAM HYDROBROMIDE 20 MG PO TABS: 20.0000 mg | ORAL_TABLET | Freq: Every day | ORAL | 0 refills | Status: DC

## 2016-12-18 MED ORDER — CARBAMAZEPINE 200 MG PO TABS: 200.0000 mg | ORAL_TABLET | Freq: Two times a day (BID) | ORAL | 0 refills | Status: DC

## 2016-12-18 NOTE — Discharge Summary (Signed)
Physician Discharge Summary Note  Patient:  Brad Barr is an 41 y.o., male MRN:  [id number] DOB:  August 26, 1976 Patient phone:  [phone] (home)  Patient address:   [address] [place] [address],  Total Time spent with patient: 30 minutes  Date of Admission:  12/16/2016 Date of Discharge: 12/18/2016  Reason for Admission:  Aggressive behavior  Principal Problem: Substance or medication-induced bipolar and related disorder [facility]) Discharge Diagnoses: Patient Active Problem List   Diagnosis Date Noted  . 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 GAD (generalized anxiety disorder) [F41.1] 11/07/2015  . 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 ADHD (attention deficit hyperactivity disorder)   . Bipolar 1 disorder (HCC)   . Schizophrenia (HCC)    Pt requesting this be removed.    History reviewed. No pertinent surgical history. Family History:  Family History  Problem Relation Age of Onset  . Mental illness Other   . Mental illness Sister    Family Psychiatric  History: see HPI Social History:  History  Alcohol Use No     History  Drug Use No    Social History   Social History  . Marital status: Single    Spouse name: N/A  . Number of children: N/A  . Years of education: N/A   Social History Main Topics  . Smoking status: Former Games developer  . Smokeless tobacco: Former Neurosurgeon     Comment: Patient uses vapor.  . Alcohol use No  . Drug use: No  . Sexual activity: No   Other Topics Concern  . None   Social History Narrative  . None    Hospital Course:  Brad Nunnis a 41 y.o.caucasian male, single who presentedunder IVC, taken out by his father  for aggression at home.  Reportedly, patient was destroying property and also hearing loud voices.  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  Patient was treated with medications with their indications listed below in detail under Medication List.  Medical problems were identified and treated as needed.  Home medications were restarted as appropriate.  Improvement was monitored by observation and Brad Barr daily report of symptom reduction.  Emotional and mental status was monitored by daily self inventory reports completed by Brad Barr and clinical staff.  Patient reported continued improvement, denied any new concerns.  Patient had been compliant on medications and denied side effects.  Support and encouragement was provided.         Brad Barr was evaluated by the treatment team for stability and plans for continued recovery upon discharge.  Patient was offered further treatment options upon discharge including Residential, Intensive Outpatient and Outpatient treatment. Patient will follow up with agency listed below for medication management and counseling.  Encouraged patient to maintain satisfactory support network and home environment.  Advised to adhere to medication compliance and outpatient treatment follow up.  Prescriptions provided.       Brad Barr motivation was an integral factor for scheduling further treatment.  Employment, transportation, bed availability, health status, family support, and any pending legal issues were also considered during patient's hospital stay.  Upon completion of this admission the patient was both mentally and medically stable  for discharge denying suicidal/homicidal ideation, auditory/visual/tactile hallucinations, delusional thoughts and paranoia.      Physical Findings: AIMS: Facial and Oral Movements Muscles of Facial Expression: None, normal Lips and Perioral Area: None,  normal Jaw: None, normal Tongue: None, normal,Extremity Movements Upper (arms, wrists, hands, fingers): None, normal Lower (legs, knees, ankles, toes): None, normal, Trunk Movements Neck, shoulders, hips: None, normal, Overall Severity Severity of abnormal movements (highest score from questions above): None, normal Incapacitation due to abnormal movements: None, normal Patient's awareness of abnormal movements (rate only patient's report): No Awareness, Dental Status Current problems with teeth and/or dentures?: No Does patient usually wear dentures?: No  CIWA:    COWS:     Musculoskeletal: Strength & Muscle Tone: within normal limits Gait & Station: normal Patient leans: N/A  Psychiatric Specialty Exam:  See MD SRA Physical Exam  Nursing note and vitals reviewed. Psychiatric: He has a normal mood and affect. His speech is normal and behavior is normal. Judgment and thought content normal. Cognition and memory are normal.    ROS  Blood pressure 115/83, pulse 66, temperature 98.8 F (37.1 C), resp. rate 16, height 6\' 2"  (1.88 m), weight 96.2 kg (212 lb).Body mass index is 27.22 kg/m.   Have you used any form of tobacco in the last 30 days? (Cigarettes, Smokeless Tobacco, Cigars, and/or Pipes): No  Has this patient used any form of tobacco in the last 30 days? (Cigarettes, Smokeless Tobacco, Cigars, and/or Pipes) Yes, NA  Blood Alcohol level:  Lab Results  Component Value Date   ETH <5 12/15/2016   ETH <5 10/15/2016    Metabolic Disorder Labs:  Lab Results  Component Value Date   HGBA1C 5.3 10/18/2016   MPG 105 10/18/2016   MPG 111 11/07/2015   Lab Results  Component Value Date   PROLACTIN 35.3 (H) 10/18/2016   PROLACTIN 79.3 (H) 02/23/2016   Lab Results  Component Value Date   CHOL 206 (H) 10/18/2016   TRIG 96 10/18/2016   HDL 40 (L) 10/18/2016   CHOLHDL 5.2 10/18/2016   VLDL 19 10/18/2016   LDLCALC 147 (H) 10/18/2016   LDLCALC 116 (H) 11/07/2015    See  Psychiatric Specialty Exam and Suicide Risk Assessment completed by Attending Physician prior to discharge.  Discharge destination:  Home  Is patient on multiple antipsychotic therapies at discharge:  No   Has Patient had three or more failed trials of antipsychotic monotherapy by history:  No  Recommended Plan for Multiple Antipsychotic Therapies: NA   Allergies as of 12/18/2016   No Known Allergies     Medication List    STOP taking these medications   benztropine 1 MG tablet Commonly known as:  COGENTIN   haloperidol 5 MG tablet Commonly known as:  HALDOL   traZODone 50 MG tablet Commonly known as:  DESYREL     TAKE these medications     Indication  carbamazepine 200 MG tablet Commonly known as:  TEGRETOL Take 1 tablet (200 mg total) by mouth 2 (two) times daily after a meal.  Indication:  Manic-Depression, Dyscontrol Syndrome   citalopram 20 MG tablet Commonly known as:  CELEXA Take 1 tablet (20 mg total) by mouth daily. Start taking on:  12/19/2016  Indication:  Aggressive Behavior, Depression   hydrOXYzine 25 MG tablet Commonly known as:  ATARAX/VISTARIL Take 1 tablet (25 mg total) by mouth 3 (three) times daily as needed for anxiety. What changed:  when to take this  Indication:  Anxiety Neurosis  Follow-up [facility] Follow up on 12/19/2016.   Specialty:  Professional Counselor Why:  Thursday at noon with Valinda Hoar, then 2:30 with Dr Christin Fudge  While there, ask to fill out the [facility] application.  They meet Monday evenings for 8 weeks, and the cost is $15.00 per session. Contact information: [facility] [place] [address] [place] [address] [phone]           Follow-up recommendations:  Activity:  as tol Diet:  as tol  Comments:  1.  Take all your medications as prescribed.   2.  Report any adverse side effects to outpatient provider. 3.  Patient  instructed to not use alcohol or illegal drugs while on prescription medicines. 4.  In the event of worsening symptoms, instructed patient to call 911, the crisis hotline or go to nearest emergency room for evaluation of symptoms.  Signed: Lindwood Qua, NP [facility] 12/18/2016, 10:13 AM

## 2016-12-18 NOTE — Tx Team (Signed)
Interdisciplinary Treatment and Diagnostic Plan Update  12/18/2016 Time of Session: 10:02 AM  Brad Barr MRN: [id number]  Principal Diagnosis: Substance or medication-induced bipolar and related disorder ([place])  Secondary Diagnoses: Principal Problem:   Substance or medication-induced bipolar and related disorder ([place]) Active Problems:   Stimulant use disorder   GAD (generalized anxiety disorder)   Attention deficit hyperactivity disorder (ADHD), predominantly inattentive type   Moderate benzodiazepine use disorder (HCC)   Current Medications:  Current Facility-Administered Medications  Medication Dose Route Frequency Provider Last Rate Last Dose  . naproxen (NAPROSYN) 375 MG tablet           . acetaminophen (TYLENOL) tablet 650 mg  650 mg Oral Q6H PRN Patrecia Pour, NP      . alum & mag hydroxide-simeth (MAALOX/MYLANTA) 200-200-20 MG/5ML suspension 30 mL  30 mL Oral Q4H PRN Patrecia Pour, NP      . carbamazepine (TEGRETOL) tablet 200 mg  200 mg Oral BID PC Patrecia Pour, NP   200 mg at 12/18/16 0800  . citalopram (CELEXA) tablet 20 mg  20 mg Oral Daily Patrecia Pour, NP   20 mg at 12/18/16 0759  . hydrOXYzine (ATARAX/VISTARIL) tablet 25 mg  25 mg Oral TID PRN Patrecia Pour, NP   25 mg at 12/17/16 2301  . lidocaine (LIDODERM) 5 % 1 patch  1 patch Transdermal Q24H Ursula Alert, MD   1 patch at 12/18/16 0944  . magnesium hydroxide (MILK OF MAGNESIA) suspension 30 mL  30 mL Oral Daily PRN Patrecia Pour, NP      . naproxen (NAPROSYN) tablet 375 mg  375 mg Oral BID WC Ursula Alert, MD   375 mg at 12/18/16 0944    PTA Medications: Prescriptions Prior to Admission  Medication Sig Dispense Refill Last Dose  . benztropine (COGENTIN) 1 MG tablet Take 1 tablet (1 mg total) by mouth 2 (two) times daily. (Patient not taking: Reported on 12/15/2016) 60 tablet 0 Not Taking at Unknown time  . citalopram (CELEXA) 20 MG tablet Take 1 tablet (20 mg total) by mouth daily. (Patient not taking:  Reported on 12/15/2016) 30 tablet 0 Not Taking at Unknown time  . haloperidol (HALDOL) 5 MG tablet Take 1 tablet (5 mg total) by mouth 2 (two) times daily. (Patient not taking: Reported on 12/15/2016) 60 tablet 0 Not Taking at Unknown time  . hydrOXYzine (ATARAX/VISTARIL) 25 MG tablet Take 1 tablet (25 mg total) by mouth every 6 (six) hours as needed for anxiety. (Patient not taking: Reported on 12/15/2016) 60 tablet 0 Not Taking at Unknown time  . traZODone (DESYREL) 50 MG tablet Take 1 tablet (50 mg total) by mouth at bedtime as needed for sleep. (Patient not taking: Reported on 12/15/2016) 30 tablet 0 Not Taking at Unknown time    Treatment Modalities: Medication Management, Group therapy, Case management,  1 to 1 session with clinician, Psychoeducation, Recreational therapy.   Physician Treatment Plan for Primary Diagnosis: Substance or medication-induced bipolar and related disorder ([place]) Long Term Goal(s): Improvement in symptoms so as ready for discharge  Short Term Goals: Compliance with prescribed medications will improve  Medication Management: Evaluate patient's response, side effects, and tolerance of medication regimen.  Therapeutic Interventions: 1 to 1 sessions, Unit Group sessions and Medication administration.  Evaluation of Outcomes: Adequate for Discharge  Physician Treatment Plan for Secondary Diagnosis: Principal Problem:   Substance or medication-induced bipolar and related disorder [facility]) Active Problems:   Stimulant use disorder  GAD (generalized anxiety disorder)   Attention deficit hyperactivity disorder (ADHD), predominantly inattentive type   Moderate benzodiazepine use disorder (HCC)   Long Term Goal(s): Improvement in symptoms so as ready for discharge  Short Term Goals: Ability to identify triggers associated with substance abuse/mental health issues will improve  Medication Management: Evaluate patient's response, side effects, and tolerance of medication  regimen.  Therapeutic Interventions: 1 to 1 sessions, Unit Group sessions and Medication administration.  Evaluation of Outcomes: Adequate for Discharge   RN Treatment Plan for Primary Diagnosis: Substance or medication-induced bipolar and related disorder ([place]) Long Term Goal(s): Knowledge of disease and therapeutic regimen to maintain health will improve  Short Term Goals: Ability to identify and develop effective coping behaviors will improve and Compliance with prescribed medications will improve  Medication Management: RN will administer medications as ordered by provider, will assess and evaluate patient's response and provide education to patient for prescribed medication. RN will report any adverse and/or side effects to prescribing provider.  Therapeutic Interventions: 1 on 1 counseling sessions, Psychoeducation, Medication administration, Evaluate responses to treatment, Monitor vital signs and CBGs as ordered, Perform/monitor CIWA, COWS, AIMS and Fall Risk screenings as ordered, Perform wound care treatments as ordered.  Evaluation of Outcomes: Adequate for Discharge   Recreational Therapy Treatment Plan for Primary Diagnosis: Substance or medication-induced bipolar and related disorder ([place]) Long Term Goal(s): LTG- Patient will participate in recreation therapy tx in at least 2 group sessions without prompting from LRT.  Short Term Goals: Patient will demonstrate increased ability to follow instructions, as demonstrated by ability to follow LRT instructions on first prompt during recreation therapy group sessions.  Treatment Modalities: Group and Pet Therapy  Therapeutic Interventions: Psychoeducation  Evaluation of Outcomes: Adequate for Discharge   LCSW Treatment Plan for Primary Diagnosis: Substance or medication-induced bipolar and related disorder ([place]) Long Term Goal(s): Safe transition to appropriate next level of care at discharge, Engage patient in therapeutic  group addressing interpersonal concerns.  Short Term Goals: Engage patient in aftercare planning with referrals and resources  Therapeutic Interventions: Assess for all discharge needs, 1 to 1 time with Social worker, Explore available resources and support systems, Assess for adequacy in community support network, Educate family and significant other(s) on suicide prevention, Complete Psychosocial Assessment, Interpersonal group therapy.  Evaluation of Outcomes: Met  Return home, follow up Family Services   Progress in Treatment: Attending groups: No Participating in groups: No Taking medication as prescribed: Yes Toleration medication: Yes, no side effects reported at this time Family/Significant other contact made: Yes-Dr called Patient understands diagnosis: No  Limited insight Discussing patient identified problems/goals with staff: Yes Medical problems stabilized or resolved: Yes Denies suicidal/homicidal ideation: Yes Issues/concerns per patient self-inventory: None Other: N/A  New problem(s) identified: None identified at this time.   New Short Term/Long Term Goal(s): None identified at this time.   Discharge Plan or Barriers:   Reason for Continuation of Hospitalization:   Estimated Length of Stay: D/C today  Attendees: Patient: 12/18/2016  10:02 AM  Physician: Ursula Alert, MD 12/18/2016  10:02 AM  Nursing: Hoy Register, RN 12/18/2016  10:02 AM  RN Care Manager: Lars Pinks, RN 12/18/2016  10:02 AM  Social Worker: Ripley Fraise 12/18/2016  10:02 AM  Recreational Therapist: Joenathan Sakuma  12/18/2016  10:02 AM  Other: Norberto Sorenson 12/18/2016  10:02 AM  Other:  12/18/2016  10:02 AM    Scribe for Treatment Team:  Roque Lias LCSW 12/18/2016 10:02 AM

## 2016-12-18 NOTE — Progress Notes (Signed)
Pt discharged home on a bus pass. Pt was ambulatory, stable and appreciative at that time. All papers and prescriptions were given and valuables returned. Verbal understanding expressed. Denies SI/HI and A/VH. Pt given opportunity to express concerns and ask questions.  

## 2016-12-18 NOTE — Progress Notes (Signed)
Recreation Therapy Notes  Date: 12/18/16 Time: 1000 Location: 500 Hall Dayroom  Group Topic: Communication, Team Building, Problem Solving  Goal Area(s) Addresses:  Patient will effectively work with peer towards shared goal.  Patient will identify skills used to make activity successful.  Patient will identify how skills used during activity can be used to reach post d/c goals.   Intervention: STEM Activity  Activity: Stage managerLanding Pad. In teams patients were given 12 plastic drinking straws and a length of masking tape. Using the materials provided patients were asked to build a landing pad to catch a golf ball dropped from approximately 6 feet in the air.   Education: Pharmacist, communityocial Skills, Discharge Planning   Education Outcome: Acknowledges education/In group clarification offered/Needs additional education.   Clinical Observations/Feedback:  Pt did not attend group.   Caroll RancherMarjette Kaisen Ackers, LRT/CTRS     Caroll RancherLindsay, Caroline Longie A 12/18/2016 12:22 PM

## 2016-12-18 NOTE — [facility] Suicide Risk Assessment (Signed)
[facility]   Principal Problem: Substance or medication-induced bipolar and related disorder [facility](HCC) Discharge Diagnoses:  Patient Active Problem List   Diagnosis Date Noted  . 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 GAD (generalized anxiety disorder) [F41.1] 11/07/2015  . 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  All other systems reviewed and are negative.   Blood pressure 115/83, pulse 66, temperature 98.8 F (37.1 C), resp. rate 16, height 6\' 2"  (1.88 m), weight 96.2 kg (212 lb).Body mass index is 27.22 kg/m.  General Appearance: Casual  Eye Contact::  Fair  Speech:  Clear and Coherent409  Volume:  Normal  Mood:  Euthymic  Affect:  Congruent  Thought Process:  Goal Directed and Descriptions of Associations: Intact  Orientation:  Full (Time, Place, and Person)  Thought Content:  Logical  Suicidal Thoughts:  No  Homicidal Thoughts:  No  Memory:  Immediate;   Fair Recent;   Fair Remote;   Fair  Judgement:  Fair  Insight:  Fair  Psychomotor Activity:  Normal  Concentration:  Fair  Recall:  FiservFair  Fund of Knowledge:Fair  Language: Fair  Akathisia:  No  Handed:  Right  AIMS (if indicated):     Assets:  Communication Skills Desire for Improvement  Sleep:  Number of Hours: 6.75  Cognition: WNL  ADL's:  Intact   Mental Status Per Nursing Assessment::   On Admission:  NA  Demographic Factors:  Male and Caucasian  Loss Factors: NA  Historical  Factors: Impulsivity  Risk Reduction Factors:   Positive social support and Positive therapeutic relationship  Continued Clinical Symptoms:  Previous Psychiatric Diagnoses and Treatments  Cognitive Features That Contribute To Risk:  None    Suicide Risk:  Minimal: No identifiable suicidal ideation.  Patients presenting with no risk factors but with morbid ruminations; may be classified as minimal risk based on the severity of the depressive symptoms  Follow-up Information    [facility] on 12/19/2016.   Specialty:  Professional Counselor Why:  Thursday at noon with Valinda HoarMeredith Baker, then 2:30 with Dr Herminio CommonsSteele Contact information: [facility] [address] [place] [address] [phone]           Plan Of Care/Follow-up recommendations:  Activity:  no restrictions Diet:  regular Other:  none  Brad [place], MD 12/18/2016, 9:50 AM

## 2016-12-18 NOTE — Progress Notes (Signed)
  [facility] :  Will you be returning to the same living situation after discharge:  Yes,  home At discharge, do you have transportation home?: Yes,  family Do you have the ability to pay for your medications: Yes,  mental health  Release of information consent forms completed and in the chart;  Patient's signature needed at discharge.  Patient to Follow up at: Follow-up [employer]    [facility] Follow up on 12/19/2016.   Specialty:  Professional Counselor Why:  Thursday at noon with Valinda HoarMeredith Baker, then 2:30 with Dr Christin FudgeSteele  While there, ask to fill out the [employer] application.  They meet Monday evenings for 8 weeks, and the cost is $15.00 per session. Contact information: [facility] [address] [place] [address] [phone]           Next level of care provider has access to [facility] Link:no  Safety Planning and Suicide Prevention discussed: Yes,  yes  Have you used any form of tobacco in the last 30 days? (Cigarettes, Smokeless Tobacco, Cigars, and/or Pipes): No  Has patient been referred to the Quitline?: N/A patient is not a smoker  Patient has been referred for addiction treatment: Pt. refused referral  Brad Barr 12/18/2016, 10:06 AM

## 2016-12-18 NOTE — [facility] Group Notes (Signed)
[facility] LCSW Group Therapy  12/18/2016 2:38 PM   Type of Therapy:  Group Therapy   Participation Level:  Engaged  Participation Quality:  Attentive  Affect:  Appropriate   Cognitive:  Alert   Insight:  Engaged  Engagement in Therapy:  Improving   Modes of Intervention:  Education, Exploration, Socialization   Summary of Progress/Problems: Invited, chose not to attend.   Onalee HuaDavid from the [employer] was here to tell his story of recovery, inform patients about [facility] and play his guitar.   Brad DaubJolan Delmon Barr 12/18/2016 2:38 PM

## 2016-12-18 NOTE — Progress Notes (Signed)
D: Pt was in his room upon initial approach.  Pt presents with appropriate affect and mood.  He reports he had a "good" day and his goal is to "just get out."  Pt denies SI/HI, denies hallucinations, denies pain.  Pt has been visible in milieu interacting with peers and staff appropriately.  A: Introduced self to pt.  Actively listened to pt and offered support and encouragement. PRN medication administered for anxiety. R: Pt is safe on the unit.  Pt is compliant with medication.  Pt verbally contracts for safety.  Will continue to monitor and assess.

## 2017-03-26 ENCOUNTER — Telehealth (HOSPITAL_COMMUNITY): Payer: Self-pay

## 2017-03-26 NOTE — Telephone Encounter (Signed)
Medication management - Telephone message left for patient after he left a message requesting a refill of Celexa. Informed pt. he would need to follow up with new providers at [facility] [place] or call our office back to schedule as a new patient visit.

## 2017-03-27 ENCOUNTER — Emergency Department (HOSPITAL_COMMUNITY)
Admission: EM | Admit: 2017-03-27 | Discharge: 2017-03-27 | Disposition: A | Discharge status: Home / Self Care | Payer: Self-pay | Attending: Emergency Medicine | Admitting: Emergency Medicine

## 2017-03-27 ENCOUNTER — Encounter (HOSPITAL_COMMUNITY): Payer: Self-pay | Admitting: *Deleted

## 2017-03-27 DIAGNOSIS — F909 Attention-deficit hyperactivity disorder, unspecified type: Secondary | ICD-10-CM | POA: Insufficient documentation

## 2017-03-27 DIAGNOSIS — Z760 Encounter for issue of repeat prescription: Secondary | ICD-10-CM | POA: Insufficient documentation

## 2017-03-27 DIAGNOSIS — Z87891 Personal history of nicotine dependence: Secondary | ICD-10-CM | POA: Insufficient documentation

## 2017-03-27 MED ORDER — CITALOPRAM HYDROBROMIDE 20 MG PO TABS: 20.0000 mg | ORAL_TABLET | Freq: Every day | ORAL | 0 refills | Status: DC

## 2017-03-27 NOTE — ED Provider Notes (Signed)
[facility]-EMER[facility]ENCY DEPT Provider Note   CSN: [id number] Arrival date & time: 03/27/17  1557  By signing my name below, I, Teofilo Pod, attest that this documentation has been prepared under the direction and in the presence of [address] [employer]. Electronically Signed: Teofilo Pod, ED Scribe. 03/27/2017. 4:33 PM.    History   Chief Complaint Chief Complaint  Patient presents with  . Medication Refill   The history is provided by the patient and medical records. No language interpreter was used.  Medication Refill  Medications/supplies requested:  Citalopram  Reason for request:  Medications ran out Medications taken before: yes - see home medications   Patient has complete original prescription information: yes    HPI Comments:  Brad Barr is a 41 y.o. male with a PMHx of schizophrenia, ADHD, and bipolar 1 disorder, who presents to the Emergency Department here requesting a refill of citalopram  after running because he is about to run out. He has been taking the medication as prescribed. He has yet to follow up with psychiatry, states he's working on getting an appt. He has no complaints at this time. No issues with the medications. Denies SI/HI, hallucinations, fevers, chills, CP, SOB, abd pain, N/V/D/C, hematuria, dysuria, myalgias, arthralgias, numbness, tingling, focal weakness, or any other complaints at this time.   Past Medical History:  Diagnosis Date  . ADHD (attention deficit hyperactivity disorder)   . Bipolar 1 disorder (HCC)   . Schizophrenia (HCC)    Pt requesting this be removed.     Patient Active Problem List   Diagnosis Date Noted  . 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 Moderate benzodiazepine use disorder (HCC) 10/17/2016  . Attention deficit hyperactivity disorder (ADHD), predominantly inattentive type   . GAD (generalized anxiety disorder) 11/07/2015  . Stimulant use disorder 11/06/2015     History reviewed. No pertinent surgical history.     Home Medications    Prior to Admission medications   Medication Sig Start Date End Date Taking? Authorizing Provider  carbamazepine (TEGRETOL) 200 MG tablet Take 1 tablet (200 mg total) by mouth 2 (two) times daily after a meal. 12/18/16   Adonis Brook, NP  citalopram (CELEXA) 20 MG tablet Take 1 tablet (20 mg total) by mouth daily. 12/19/16   Adonis Brook, NP  hydrOXYzine (ATARAX/VISTARIL) 25 MG tablet Take 1 tablet (25 mg total) by mouth 3 (three) times daily as needed for anxiety. 12/18/16   Adonis Brook, NP    Family History Family History  Problem Relation Age of Onset  . Mental illness Other   . Mental illness Sister     Social History Social History  Substance Use Topics  . Smoking status: Former Games developer  . Smokeless tobacco: Former Neurosurgeon     Comment: Patient uses vapor.  . Alcohol use No     Allergies   Patient has no known allergies.   Review of Systems Review of Systems  Constitutional: Negative for chills and fever.  Respiratory: Negative for shortness of breath.   Cardiovascular: Negative for chest pain.  Gastrointestinal: Negative for abdominal pain, constipation, diarrhea, nausea and vomiting.  Genitourinary: Negative for dysuria and hematuria.  Musculoskeletal: Negative for arthralgias and myalgias.  Skin: Negative for color change.  Allergic/Immunologic: Negative for immunocompromised state.  Neurological: Negative for weakness and numbness.  Psychiatric/Behavioral: Negative for confusion, hallucinations and suicidal ideas.   All systems reviewed and are negative for acute change except as noted in the HPI.  Physical Exam Updated Vital Signs BP (!) 127/105 (BP Location: Right Arm)   Pulse 66   Temp 97.6 F (36.4 C) (Oral)   Resp 16   SpO2 96%   Physical Exam  Constitutional: He is oriented to person, place, and time. Vital signs are normal. He appears well-developed and  well-nourished.  Non-toxic appearance. No distress.  Afebrile, nontoxic, NAD  HENT:  Head: Normocephalic and atraumatic.  Mouth/Throat: Mucous membranes are normal.  Eyes: Conjunctivae and EOM are normal. Right eye exhibits no discharge. Left eye exhibits no discharge.  Neck: Normal range of motion. Neck supple.  Cardiovascular: Normal rate and intact distal pulses.   Pulmonary/Chest: Effort normal. No respiratory distress.  Abdominal: Normal appearance. He exhibits no distension.  Musculoskeletal: Normal range of motion.  Neurological: He is alert and oriented to person, place, and time. He has normal strength. No sensory deficit.  Skin: Skin is warm, dry and intact. No rash noted.  Psychiatric: He has a normal mood and affect.  Nursing note and vitals reviewed.    ED Treatments / Results  DIAGNOSTIC STUDIES:  Oxygen Saturation is 96% on RA, normal by my interpretation.    COORDINATION OF CARE:  4:33 PM Discussed treatment plan with pt at bedside and pt agreed to plan.   Labs (all labs ordered are listed, but only abnormal results are displayed) Labs Reviewed - No data to display  EKG  EKG Interpretation None       Radiology No results found.  Procedures Procedures (including critical care time)  Medications Ordered in ED Medications - No data to display   Initial Impression / Assessment and Plan / ED Course  I have reviewed the triage vital signs and the nursing notes.  Pertinent labs & imaging results that were available during my care of the patient were reviewed by me and considered in my medical decision making (see chart for details).     41 y.o. male here for med refill, hasn't yet followed up with psychiatrist but is in the process of getting appt. No issues or complaints, compliant with meds, brings bottle with him and only has a few left which is appropriate based on when it was filled. Will provide one time courtesy refill but advised pt that he  needs to f/up with psychiatrist for future refills and ongoing management. Denies SI/HI/AVH. Does not appear to be a threat to himself or others. Stable for d/c. I explained the diagnosis and have given explicit precautions to return to the ER including for any other new or worsening symptoms. The patient understands and accepts the medical plan as it's been dictated and I have answered their questions. Discharge instructions concerning home care and prescriptions have been given. The patient is STABLE and is discharged to home in good condition.   I personally performed the services described in this documentation, which was scribed in my presence. The recorded information has been reviewed and is accurate.    Final Clinical Impressions(s) / ED Diagnoses   Final diagnoses:  Encounter for medication refill    New Prescriptions New Prescriptions   CITALOPRAM (CELEXA) 20 MG TABLET    Take 1 tablet (20 mg total) by mouth daily.     [address] PA-C 03/27/17 1643    Doug Sou, MD 03/28/17 [phone]

## 2017-03-27 NOTE — ED Triage Notes (Signed)
Pt states he needs to refill his prescription for citalopram , which he takes once a day. Pt states he was prescribed the medication here but has been unable to follow up. Pt states he has been taking medication regularly. Pt denies SI/HI or AV hallucinations.

## 2017-03-27 NOTE — Discharge Instructions (Signed)
You have been provided a one-time courtesy refill of your medication, but you will need to follow up with your psychiatrist for future refills. Please make an appointment with them as soon as possible, in order to have ongoing psychiatric care and future medication management/refills. Return to the ER for emergent changes or worsening symptoms.

## 2017-04-30 ENCOUNTER — Emergency Department (HOSPITAL_COMMUNITY)
Admission: EM | Admit: 2017-04-30 | Discharge: 2017-04-30 | Disposition: A | Discharge status: Home / Self Care | Payer: Self-pay | Attending: Emergency Medicine | Admitting: Emergency Medicine

## 2017-04-30 ENCOUNTER — Emergency Department (HOSPITAL_COMMUNITY): Payer: Self-pay

## 2017-04-30 ENCOUNTER — Encounter (HOSPITAL_COMMUNITY): Payer: Self-pay

## 2017-04-30 DIAGNOSIS — R002 Palpitations: Secondary | ICD-10-CM | POA: Insufficient documentation

## 2017-04-30 DIAGNOSIS — Z87891 Personal history of nicotine dependence: Secondary | ICD-10-CM | POA: Insufficient documentation

## 2017-04-30 DIAGNOSIS — Z79899 Other long term (current) drug therapy: Secondary | ICD-10-CM | POA: Insufficient documentation

## 2017-04-30 DIAGNOSIS — Z760 Encounter for issue of repeat prescription: Secondary | ICD-10-CM | POA: Insufficient documentation

## 2017-04-30 DIAGNOSIS — F909 Attention-deficit hyperactivity disorder, unspecified type: Secondary | ICD-10-CM | POA: Insufficient documentation

## 2017-04-30 MED ORDER — HYDROXYZINE HCL 25 MG PO TABS: 25.0000 mg | ORAL_TABLET | Freq: Three times a day (TID) | ORAL | 3 refills | Status: DC | PRN

## 2017-04-30 MED ORDER — CITALOPRAM HYDROBROMIDE 20 MG PO TABS: 20.0000 mg | ORAL_TABLET | Freq: Every day | ORAL | 3 refills | Status: DC

## 2017-04-30 MED ORDER — CARBAMAZEPINE 200 MG PO TABS: 200.0000 mg | ORAL_TABLET | Freq: Two times a day (BID) | ORAL | 3 refills | Status: DC

## 2017-04-30 NOTE — ED Provider Notes (Signed)
[facility] DE[facility] Provider Note   CSN: [id number] Arrival date & time: 04/30/17  1628     History   Chief Complaint Chief Complaint  Patient presents with  . Medication Refill  . Palpitations    HPI Brad Barr is a 41 y.o. male presents for intermittent heart palpitations and medication refill. Patient states that he has had intermittent heart palpitations. He had a few today. He has had this worked up previously because he felt it was probably benign. He denies any weight loss, racing heart, shortness of breath, chest pain, changes in medications, stimulant use, illicit drug use. He does have anxiety. The patient ran out of his medications 2 days ago. He has only missed one dose. He denies suicidal ideation, homicidal ideation or audiovisual hallucinations.  HPI  Past Medical History:  Diagnosis Date  . ADHD (attention deficit hyperactivity disorder)   . Bipolar 1 disorder (HCC)   . Schizophrenia (HCC)    Pt requesting this be removed.     Patient Active Problem List   Diagnosis Date Noted  . 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 Moderate benzodiazepine use disorder (HCC) 10/17/2016  . Attention deficit hyperactivity disorder (ADHD), predominantly inattentive type   . GAD (generalized anxiety disorder) 11/07/2015  . Stimulant use disorder 11/06/2015    History reviewed. No pertinent surgical history.     Home Medications    Prior to Admission medications   Medication Sig Start Date End Date Taking? Authorizing Provider  Biotin 1 MG CAPS Take 1 capsule by mouth daily.   Yes [provider]  citalopram (CELEXA) 20 MG tablet Take 1 tablet (20 mg total) by mouth daily. 03/27/17  Yes Street, FloravilleMercedes, PA-C  carbamazepine (TEGRETOL) 200 MG tablet Take 1 tablet (200 mg total) by mouth 2 (two) times daily after a meal. Patient not taking: Reported on 04/30/2017 12/18/16   Adonis BrookAgustin, Sheila, NP  citalopram (CELEXA) 20 MG  tablet Take 1 tablet (20 mg total) by mouth daily. Patient not taking: Reported on 04/30/2017 12/19/16   Adonis BrookAgustin, Sheila, NP  hydrOXYzine (ATARAX/VISTARIL) 25 MG tablet Take 1 tablet (25 mg total) by mouth 3 (three) times daily as needed for anxiety. Patient not taking: Reported on 04/30/2017 12/18/16   Adonis BrookAgustin, Sheila, NP    Family History Family History  Problem Relation Age of Onset  . Mental illness Other   . Mental illness Sister     Social History Social History  Substance Use Topics  . Smoking status: Former Games developermoker  . Smokeless tobacco: Former NeurosurgeonUser     Comment: Patient uses vapor.  . Alcohol use No     Allergies   Patient has no known allergies.   Review of Systems Review of Systems  Ten systems reviewed and are negative for acute change, except as noted in the HPI.   Physical Exam Updated Vital Signs BP 118/89 (BP Location: Right Arm)   Pulse 83   Temp 98.4 F (36.9 C) (Oral)   Resp 18   SpO2 95%   Physical Exam  Physical Exam  Nursing note and vitals reviewed. Constitutional: He appears well-developed and well-nourished. No distress.  HENT:  Head: Normocephalic and atraumatic.  Eyes: Conjunctivae normal are normal. No scleral icterus.  Neck: Normal range of motion. Neck supple.  Cardiovascular: Normal rate, regular rhythm and normal heart sounds.   Pulmonary/Chest: Effort normal and breath sounds normal. No respiratory distress.  Abdominal: Soft. There is no tenderness.  Musculoskeletal: He exhibits no  edema.  Neurological: He is alert.  Skin: Skin is warm and dry. He is not diaphoretic.  Psychiatric: His behavior is normal.    ED Treatments / Results  Labs (all labs ordered are listed, but only abnormal results are displayed) Labs Reviewed - No data to display  EKG  EKG Interpretation None       Radiology No results found.  Procedures Procedures (including critical care time)  Medications Ordered in ED Medications - No data to  display   Initial Impression / Assessment and Plan / ED Course  I have reviewed the triage vital signs and the nursing notes.  Pertinent labs & imaging results that were available during my care of the patient were reviewed by me and considered in my medical decision making (see chart for details).     Patient EKG and chest x-ray are reviewed and are unremarkable. Patient will be given outpatient referral to follow up with cardiology. meds refilled Final Clinical Impressions(s) / ED Diagnoses   Final diagnoses:  Heart palpitations  Medication refill    New Prescriptions New Prescriptions   No medications on file     Arthor Captain, PA-C 04/30/17 Berline Lopes, MD 05/01/17 [phone]

## 2017-04-30 NOTE — ED Notes (Signed)
Bed: [facility] Expected date:  Expected time:  Means of arrival:  Comments: 

## 2017-04-30 NOTE — Discharge Instructions (Signed)
Get help right away if: °You have chest pain or shortness of breath. °You have a severe headache. °You feel dizzy or you faint. °

## 2017-04-30 NOTE — ED Triage Notes (Signed)
Pt states needs his celexa meds filled.  Had last script here and is out.  Has not seen another physician.  Pt then states he wants seen for heart palpitations and shortness of breath for months.  Then pt describes he has groin pain which started at the same time.  Pt requesting chest xray also.

## 2017-06-03 ENCOUNTER — Encounter (HOSPITAL_COMMUNITY): Payer: Self-pay | Admitting: *Deleted

## 2017-06-03 ENCOUNTER — Emergency Department (HOSPITAL_COMMUNITY)
Admission: EM | Admit: 2017-06-03 | Discharge: 2017-06-05 | Disposition: A | Discharge status: Other Acute Inpatient Hospital | Payer: No Typology Code available for payment source | Attending: Emergency Medicine | Admitting: Emergency Medicine

## 2017-06-03 DIAGNOSIS — Z046 Encounter for general psychiatric examination, requested by authority: Secondary | ICD-10-CM

## 2017-06-03 DIAGNOSIS — F250 Schizoaffective disorder, bipolar type: Secondary | ICD-10-CM | POA: Diagnosis present

## 2017-06-03 DIAGNOSIS — Z87891 Personal history of nicotine dependence: Secondary | ICD-10-CM | POA: Insufficient documentation

## 2017-06-03 DIAGNOSIS — R258 Other abnormal involuntary movements: Secondary | ICD-10-CM | POA: Insufficient documentation

## 2017-06-03 DIAGNOSIS — Z79899 Other long term (current) drug therapy: Secondary | ICD-10-CM | POA: Insufficient documentation

## 2017-06-03 DIAGNOSIS — F909 Attention-deficit hyperactivity disorder, unspecified type: Secondary | ICD-10-CM | POA: Insufficient documentation

## 2017-06-03 LAB — CBC
HEMATOCRIT: 43.5 % (ref 39.0–52.0)
HEMOGLOBIN: 15.2 g/dL (ref 13.0–17.0)
MCH: 28.1 pg (ref 26.0–34.0)
MCHC: 34.9 g/dL (ref 30.0–36.0)
MCV: 80.6 fL (ref 78.0–100.0)
Platelets: 191 10*3/uL (ref 150–400)
RBC: 5.4 MIL/uL (ref 4.22–5.81)
RDW: 13.2 % (ref 11.5–15.5)
WBC: 6.5 10*3/uL (ref 4.0–10.5)

## 2017-06-03 NOTE — ED Notes (Signed)
Pt brought in IVC, escorted by sheriff. Pt was IVC'd by father. Paperwork states pt had convulsions coming from the store earlier in the evening today, then pt threw groceries throughout the yard. Parents confronted pt about not taking his medications, states pt then threatened them.   Pt denies having convulsions, states he does not have hx of seizures.

## 2017-06-04 DIAGNOSIS — F250 Schizoaffective disorder, bipolar type: Secondary | ICD-10-CM | POA: Diagnosis present

## 2017-06-04 LAB — COMPREHENSIVE METABOLIC PANEL
ALT: 42 U/L (ref 17–63)
AST: 29 U/L (ref 15–41)
Albumin: 4.4 g/dL (ref 3.5–5.0)
Alkaline Phosphatase: 55 U/L (ref 38–126)
Anion gap: 7 (ref 5–15)
BUN: 11 mg/dL (ref 6–20)
CHLORIDE: 104 mmol/L (ref 101–111)
CO2: 27 mmol/L (ref 22–32)
CREATININE: 0.75 mg/dL (ref 0.61–1.24)
Calcium: 9.4 mg/dL (ref 8.9–10.3)
Glucose, Bld: 112 mg/dL — ABNORMAL HIGH (ref 65–99)
POTASSIUM: 4 mmol/L (ref 3.5–5.1)
SODIUM: 138 mmol/L (ref 135–145)
Total Bilirubin: 0.7 mg/dL (ref 0.3–1.2)
Total Protein: 7.3 g/dL (ref 6.5–8.1)

## 2017-06-04 LAB — ETHANOL: Alcohol, Ethyl (B): <5 mg/dL (ref <5)

## 2017-06-04 LAB — SALICYLATE LEVEL

## 2017-06-04 LAB — RAPID URINE DRUG SCREEN, HOSP PERFORMED
Amphetamines: NOT DETECTED
BARBITURATES: NOT DETECTED
BENZODIAZEPINES: NOT DETECTED
COCAINE: NOT DETECTED
OPIATES: NOT DETECTED
Tetrahydrocannabinol: NOT DETECTED

## 2017-06-04 LAB — ACETAMINOPHEN LEVEL: Acetaminophen (Tylenol), Serum: <10 ug/mL — ABNORMAL LOW (ref 10–30)

## 2017-06-04 MED ORDER — ACETAMINOPHEN 325 MG PO TABS: 650.0000 mg | ORAL_TABLET | ORAL | Status: DC | PRN

## 2017-06-04 MED ORDER — HYDROXYZINE HCL 25 MG PO TABS: 25.0000 mg | ORAL_TABLET | Freq: Three times a day (TID) | ORAL | Status: DC | PRN

## 2017-06-04 MED ORDER — IBUPROFEN 200 MG PO TABS: 600.0000 mg | ORAL_TABLET | Freq: Three times a day (TID) | ORAL | Status: DC | PRN

## 2017-06-04 MED ORDER — CARBAMAZEPINE 200 MG PO TABS
200.0000 mg | ORAL_TABLET | Freq: Two times a day (BID) | ORAL | Status: DC
  Administered 2017-06-04 – 2017-06-05 (×2): 200 mg via ORAL
  Filled 2017-06-04 (×2): qty 1

## 2017-06-04 MED ORDER — NICOTINE 21 MG/24HR TD PT24: 21.0000 mg | MEDICATED_PATCH | Freq: Every day | TRANSDERMAL | Status: DC | PRN

## 2017-06-04 MED ORDER — ALUM & MAG HYDROXIDE-SIMETH 200-200-20 MG/5ML PO SUSP: 30.0000 mL | Freq: Four times a day (QID) | ORAL | Status: DC | PRN

## 2017-06-04 MED ORDER — ONDANSETRON HCL 4 MG PO TABS: 4.0000 mg | ORAL_TABLET | Freq: Three times a day (TID) | ORAL | Status: DC | PRN

## 2017-06-04 NOTE — ED Provider Notes (Signed)
[facility] DE[facility] Provider Note   CSN: [id number] Arrival date & time: 06/03/17  2233     History   Chief Complaint Chief Complaint  Patient presents with  . IVC    HPI Brad Barr is a 41 y.o. male who presents under IV. According to IVC paperwork. He was placed under commitment by his parents. He states that the patient became angry and got into an argument. He apparently threw groceries around the yard and fell into a rage. His parents state that they fear for their safety. They say he has been noncompliant with his medications. The patient is calm and collected here. He states that he thinks it was "just an argument." He denies being suicidal or homicidal. He states he is only on Celexa.  HPI  Past Medical History:  Diagnosis Date  . ADHD (attention deficit hyperactivity disorder)   . Bipolar 1 disorder (HCC)   . Schizophrenia (HCC)    Pt requesting this be removed.     Patient Active Problem List   Diagnosis Date Noted  . 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 Moderate benzodiazepine use disorder (HCC) 10/17/2016  . Attention deficit hyperactivity disorder (ADHD), predominantly inattentive type   . GAD (generalized anxiety disorder) 11/07/2015  . Stimulant use disorder 11/06/2015    History reviewed. No pertinent surgical history.     Home Medications    Prior to Admission medications   Medication Sig Start Date End Date Taking? Authorizing Provider  citalopram (CELEXA) 20 MG tablet Take 1 tablet (20 mg total) by mouth daily. 04/30/17  Yes Anthonella Klausner, PA-C  finasteride (PROSCAR) 5 MG tablet Take 5 mg by mouth daily.   Yes [provider]  carbamazepine (TEGRETOL) 200 MG tablet Take 1 tablet (200 mg total) by mouth 2 (two) times daily after a meal. Patient not taking: Reported on 06/03/2017 04/30/17   Arthor CaptainHarris, Leida Luton, PA-C  hydrOXYzine (ATARAX/VISTARIL) 25 MG tablet Take 1 tablet (25 mg total) by mouth 3  (three) times daily as needed for anxiety. Patient not taking: Reported on 06/03/2017 04/30/17   Arthor CaptainHarris, Davonta Stroot, PA-C    Family History Family History  Problem Relation Age of Onset  . Mental illness Other   . Mental illness Sister     Social History Social History  Substance Use Topics  . Smoking status: Former Games developermoker  . Smokeless tobacco: Former NeurosurgeonUser     Comment: Patient uses vapor.  . Alcohol use No     Allergies   Patient has no known allergies.   Review of Systems Review of Systems  Ten systems reviewed and are negative for acute change, except as noted in the HPI.   Physical Exam Updated Vital Signs BP 130/87 (BP Location: Right Arm)   Pulse 70   Temp 98.3 F (36.8 C) (Oral)   Resp 18   Ht 6\' 2"  (1.88 m)   Wt 101.2 kg (223 lb)   SpO2 96%   BMI 28.63 kg/m   Physical Exam  Constitutional: He appears well-developed and well-nourished. No distress.  HENT:  Head: Normocephalic and atraumatic.  Eyes: Conjunctivae are normal. No scleral icterus.  Neck: Normal range of motion. Neck supple.  Cardiovascular: Normal rate, regular rhythm and normal heart sounds.   Pulmonary/Chest: Effort normal and breath sounds normal. No respiratory distress.  Abdominal: Soft. There is no tenderness.  Musculoskeletal: He exhibits no edema.  Neurological: He is alert.  Skin: Skin is warm and dry. He is not  diaphoretic.  Psychiatric: His behavior is normal.  Nursing note and vitals reviewed.    ED Treatments / Results  Labs (all labs ordered are listed, but only abnormal results are displayed) Labs Reviewed  COMPREHENSIVE METABOLIC PANEL - Abnormal; Notable for the following:       Result Value   Glucose, Bld 112 (*)    All other components within normal limits  ACETAMINOPHEN LEVEL - Abnormal; Notable for the following:    Acetaminophen (Tylenol), Serum <10 (*)    All other components within normal limits  ETHANOL  SALICYLATE LEVEL  CBC  RAPID URINE DRUG SCREEN, HOSP  PERFORMED    EKG  EKG Interpretation None       Radiology No results found.  Procedures Procedures (including critical care time)  Medications Ordered in ED Medications - No data to display   Initial Impression / Assessment and Plan / ED Course  I have reviewed the triage vital signs and the nursing notes.  Pertinent labs & imaging results that were available during my care of the patient were reviewed by me and considered in my medical decision making (see chart for details).     patient appears medically clear.  Final Clinical Impressions(s) / ED Diagnoses   Final diagnoses:  Involuntary commitment    New Prescriptions New Prescriptions   No medications on file     Arthor Captain, PA-C 06/04/17 0049    Loren Racer, MD 06/06/17 [phone]

## 2017-06-04 NOTE — BH Assessment (Signed)
[name]  Per Thedore MinsMojeed Akintayo, MD, this pt requires psychiatric hospitalization at this time.  Pt presents under IVC initiated by pt's father and upheld by Dr Jannifer FranklinAkintayo.  The following facilities have been contacted to seek placement for this pt, with results as noted:  Beds available, information sent, decision pending:  [facility]   At capacity:  Id[facility]  Lehman [place], [place] Triage Specialist [phone]

## 2017-06-04 NOTE — Progress Notes (Signed)
Met with pt 1:1.  Pt forwards little information to Probation officer.  He denies SI/HI, denies hallucinations, denies pain.  Support and encouragement provided.  Pt reports he will inform staff of needs and concerns.  He is safe on the unit and he verbally contracts for safety.  Will continue to monitor and assess.

## 2017-06-04 NOTE — ED Notes (Signed)
Pt forwards little with this nurse. Denies SI/HI. Pt given specimen cup and encouraged to provide urine per MD order. Encouragement and support provided. Special checks q 15 mins in place for safety, Video monitoring in place. Will continue to monitor.

## 2017-06-04 NOTE — ED Notes (Signed)
Patient admitted. Pleasant and cheerful. Denies pain, SI/HI, AH/VH at the moment. Verbalizes no concern. No behavior issues noted. Will continue to monitor patient.

## 2017-06-04 NOTE — Progress Notes (Signed)
Per Donell SievertSpencer, [name], NP recommend a.m. Psych evaluation Brad Barr K. Brad HandingHarris, LCAS-A, LPC-A, [facility]NCC  Counselor 06/04/2017 1:44 AM

## 2017-06-04 NOTE — Progress Notes (Signed)
Contacted [facility] RN order cart for TTS Brad Barr, LCAS-A, LPC-A, NCC  Counselor 06/04/2017 1:20 AM

## 2017-06-04 NOTE — BH Assessment (Signed)
Tele Assessment Note   Brad Barr is an 41 y.o. male, Caucasian, Single, who presents to [facility] per ED report: Pt brought in IVC, escorted by sheriff. Pt was IVC'd by father. Paperwork states pt had convulsions coming from the store earlier in the evening today, then pt threw groceries throughout the yard. Parents confronted pt about not taking his medications, states pt then threatened them.   Patient states no primary concern, acknowledges throwing groceries earlier. Patient states does reside with parents, has hx. Of Bipolar. Patient states no change in sleep with 8 hours or more per night. Patient denies current SI/HI and AVH. Patient denies S.A. Hx. Patient has been seen inpatient in 2018 and 2017 with [facility] for Bipolar inpatient psych care. Patient state sis seen outpatient for psych currently with [facility].  Patient is dressed in scrubs and is alert and oriented x4. Patient speech was within normal limits and motor behavior appeared normal. Patient thought process is coherent. Patient  does not appear to be responding to internal stimuli. Patient was cooperative throughout the assessment and states that he is not agreeable to inpatient psychiatric treatment.   Diagnosis: Bipolar I Disorder  Past Medical History:  Past Medical History:  Diagnosis Date  . ADHD (attention deficit hyperactivity disorder)   . Bipolar 1 disorder (HCC)   . Schizophrenia (HCC)    Pt requesting this be removed.     History reviewed. No pertinent surgical history.  Family History:  Family History  Problem Relation Age of Onset  . Mental illness Other   . Mental illness Sister     Social History:  reports that he has quit smoking. He has quit using smokeless tobacco. He reports that he does not drink alcohol or use drugs.  Additional Social History:  Alcohol / Drug Use Pain Medications: SEE MAR Prescriptions: SEE MAR Over the Counter: SEE MAR History of alcohol / drug use?: No history of  alcohol / drug abuse  CIWA: CIWA-Ar BP: 130/87 Pulse Rate: 70 COWS:    PATIENT STRENGTHS: (choose at least two) Ability for insight Capable of independent living Communication skills  Allergies: No Known Allergies  Home Medications:  (Not in a hospital admission)  OB/GYN Status:  No LMP for male patient.  General Assessment Data [facility] TTS Assessment: In system Is this a Tele or Face-to-Face Assessment?: Tele Assessment Is this an Initial Assessment or a Re-assessment for this encounter?: Initial Assessment Marital status: Single Maiden name: n/a Is patient pregnant?: No Pregnancy Status: No Living Arrangements: Parent Can pt return to current living arrangement?: Yes Admission Status: Involuntary Is patient capable of signing voluntary admission?: Yes Referral Source: Other Insurance type: none     Crisis Care Plan Living Arrangements: Parent Name of Psychiatrist: [facility] Name of Therapist: [facility]  Education Status Is patient currently in school?: No Current Grade: n/a Highest grade of school patient has completed: 12th Name of school: n/a Solicitor person: mother, father  Risk to self with the past 6 months Suicidal Ideation: No Has patient been a risk to self within the past 6 months prior to admission? : No Suicidal Intent: No Has patient had any suicidal intent within the past 6 months prior to admission? : No Is patient at risk for suicide?: No Suicidal Plan?: No Has patient had any suicidal plan within the past 6 months prior to admission? : No Access to Means: No What has been your use of drugs/alcohol within the last 12  months?: none Previous Attempts/Gestures: No How many times?: 0 Other Self Harm Risks: none Triggers for Past Attempts: Unpredictable Intentional Self Injurious Behavior: None Family Suicide History: No Recent stressful life event(s): Turmoil (Comment) Persecutory voices/beliefs?:  No Depression: Yes Depression Symptoms: Guilt, Loss of interest in usual pleasures, Feeling worthless/self pity, Feeling angry/irritable Substance abuse history and/or treatment for substance abuse?: No Suicide prevention information given to non-admitted patients: Not applicable  Risk to Others within the past 6 months Homicidal Ideation: No Does patient have any lifetime risk of violence toward others beyond the six months prior to admission? : No Thoughts of Harm to Others: No Current Homicidal Intent: No Current Homicidal Plan: No Access to Homicidal Means: No Identified Victim: none History of harm to others?: No Assessment of Violence: In past 6-12 months (today, threw grocery, threat parents) Violent Behavior Description: threw grocery, threat parents Does patient have access to weapons?: No Criminal Charges Pending?: No Does patient have a court date: No Is patient on probation?: No  Psychosis Hallucinations: None noted Delusions: None noted  Mental Status Report Appearance/Hygiene: In scrubs Eye Contact: Fair Motor Activity: Freedom of movement Speech: Logical/coherent Level of Consciousness: Alert Mood: Depressed Affect: Depressed Anxiety Level: Moderate Thought Processes: Relevant Judgement: Unimpaired Orientation: Person, Place, Time, Situation, Appropriate for developmental age Obsessive Compulsive Thoughts/Behaviors: None  Cognitive Functioning Concentration: Normal Memory: Recent Intact, Remote Intact IQ: Average Insight: Fair Impulse Control: Poor Appetite: Good Weight Loss: 0 Weight Gain: 0 Sleep: No Change Total Hours of Sleep: 8 Vegetative Symptoms: None  ADLScreening [facility] Assessment Services) Patient's cognitive ability adequate to safely complete daily activities?: Yes Patient able to express need for assistance with ADLs?: Yes Independently performs ADLs?: Yes (appropriate for developmental age)  Prior Inpatient Therapy Prior Inpatient  Therapy: Yes Prior Therapy Dates: January 2018, 2017 [facility] Reason for Treatment: Bipolar  Prior Outpatient Therapy Prior Outpatient Therapy: Yes Prior Therapy Dates: current Prior Therapy Facilty/Provider(s): [facility] Reason for Treatment: Bipolar Does patient have an ACCT team?: No Does patient have Intensive In-House Services?  : No Does patient have [facility] services? : No Does patient have P4CC services?: No  ADL Screening (condition at time of admission) Patient's cognitive ability adequate to safely complete daily activities?: Yes Is the patient deaf or have difficulty hearing?: No Does the patient have difficulty seeing, even when wearing glasses/contacts?: No Does the patient have difficulty concentrating, remembering, or making decisions?: No Patient able to express need for assistance with ADLs?: Yes Does the patient have difficulty dressing or bathing?: No Independently performs ADLs?: Yes (appropriate for developmental age) Does the patient have difficulty walking or climbing stairs?: No Weakness of Legs: None Weakness of Arms/Hands: None       Abuse/Neglect Assessment (Assessment to be complete while patient is alone) Physical Abuse: Denies Verbal Abuse: Denies Sexual Abuse: Denies Exploitation of patient/patient's resources: Denies Self-Neglect: Denies     Merchant navy officerAdvance Directives (For Healthcare) Does Patient Have a Medical Advance Directive?: No    Additional Information 1:1 In Past 12 Months?: No CIRT Risk: No Elopement Risk: No Does patient have medical clearance?: Yes     Disposition: Per Donell SievertSpencer, [name], NP recommend a.m. Psych evaluation Disposition Initial Assessment Completed for this Encounter: Yes Disposition of Patient: Other dispositions (TBD)  Argil Mahl K Villa Burgin 06/04/2017 1:35 AM

## 2017-06-04 NOTE — Progress Notes (Signed)
06/04/17 1358:  LRT went to pt room to offer activities, pt declined.  Caroll RancherMarjette Lonell Stamos, LRT/CTRS

## 2017-06-05 ENCOUNTER — Encounter (HOSPITAL_COMMUNITY): Payer: Self-pay

## 2017-06-05 ENCOUNTER — Inpatient Hospital Stay (HOSPITAL_COMMUNITY)
Admission: AD | Admit: 2017-06-05 | Discharge: 2017-06-13 | DRG: 885 | Disposition: A | Discharge status: Home / Self Care | Payer: No Typology Code available for payment source | Attending: Psychiatry | Admitting: Psychiatry

## 2017-06-05 DIAGNOSIS — F900 Attention-deficit hyperactivity disorder, predominantly inattentive type: Secondary | ICD-10-CM | POA: Diagnosis present

## 2017-06-05 DIAGNOSIS — Z814 Family history of other substance abuse and dependence: Secondary | ICD-10-CM

## 2017-06-05 DIAGNOSIS — G4700 Insomnia, unspecified: Secondary | ICD-10-CM | POA: Diagnosis present

## 2017-06-05 DIAGNOSIS — Z818 Family history of other mental and behavioral disorders: Secondary | ICD-10-CM

## 2017-06-05 DIAGNOSIS — F250 Schizoaffective disorder, bipolar type: Principal | ICD-10-CM | POA: Diagnosis present

## 2017-06-05 DIAGNOSIS — Z79899 Other long term (current) drug therapy: Secondary | ICD-10-CM | POA: Diagnosis not present

## 2017-06-05 DIAGNOSIS — F17290 Nicotine dependence, other tobacco product, uncomplicated: Secondary | ICD-10-CM | POA: Diagnosis present

## 2017-06-05 DIAGNOSIS — Z046 Encounter for general psychiatric examination, requested by authority: Secondary | ICD-10-CM | POA: Insufficient documentation

## 2017-06-05 DIAGNOSIS — Z9114 Patient's other noncompliance with medication regimen: Secondary | ICD-10-CM

## 2017-06-05 DIAGNOSIS — Z9119 Patient's noncompliance with other medical treatment and regimen: Secondary | ICD-10-CM | POA: Diagnosis not present

## 2017-06-05 DIAGNOSIS — Z87891 Personal history of nicotine dependence: Secondary | ICD-10-CM

## 2017-06-05 MED ORDER — MAGNESIUM HYDROXIDE 400 MG/5ML PO SUSP: 30.0000 mL | Freq: Every day | ORAL | Status: DC | PRN

## 2017-06-05 MED ORDER — ALUM & MAG HYDROXIDE-SIMETH 200-200-20 MG/5ML PO SUSP: 30.0000 mL | ORAL | Status: DC | PRN

## 2017-06-05 MED ORDER — ACETAMINOPHEN 325 MG PO TABS: 650.0000 mg | ORAL_TABLET | Freq: Four times a day (QID) | ORAL | Status: DC | PRN

## 2017-06-05 MED ORDER — CARBAMAZEPINE 200 MG PO TABS
200.0000 mg | ORAL_TABLET | Freq: Two times a day (BID) | ORAL | Status: DC
  Administered 2017-06-05 – 2017-06-13 (×16): 200 mg via ORAL
  Filled 2017-06-05 (×22): qty 1

## 2017-06-05 MED ORDER — HYDROXYZINE HCL 25 MG PO TABS
25.0000 mg | ORAL_TABLET | Freq: Four times a day (QID) | ORAL | Status: DC | PRN
  Filled 2017-06-05: qty 10

## 2017-06-05 MED ORDER — TRAZODONE HCL 50 MG PO TABS
50.0000 mg | ORAL_TABLET | Freq: Every evening | ORAL | Status: DC | PRN
  Filled 2017-06-05: qty 7

## 2017-06-05 MED ORDER — NICOTINE POLACRILEX 2 MG MT GUM: 2.0000 mg | CHEWING_GUM | OROMUCOSAL | Status: DC | PRN

## 2017-06-05 NOTE — Progress Notes (Signed)
06/05/17 1337:  LRT went to pt room to offer activities, pt was sleep.  Caroll RancherMarjette Tameem Pullara, LRT/CTRS

## 2017-06-05 NOTE — Tx Team (Signed)
Initial Treatment Plan 06/05/2017 4:11 PM Shary KeyJohnny Mcneff ZOX:[id number]    PATIENT STRESSORS: Financial difficulties Medication change or noncompliance   PATIENT STRENGTHS: General fund of knowledge Physical Health Supportive family/friends   PATIENT IDENTIFIED PROBLEMS: Threatening his parents  Not taking medications  "To go home"                 DISCHARGE CRITERIA:  Improved stabilization in mood, thinking, and/or behavior Verbal commitment to aftercare and medication compliance  PRELIMINARY DISCHARGE PLAN: Outpatient therapy Medication management  PATIENT/FAMILY INVOLVEMENT: This treatment plan has been presented to and reviewed with the patient, Shary KeyJohnny Eissler.  The patient and family have been given the opportunity to ask questions and make suggestions.  Levin BaconHeather V Anthon Harpole, RN 06/05/2017, 4:11 PM

## 2017-06-05 NOTE — BH Assessment (Signed)
[name]  Per Thedore MinsMojeed Akintayo, MD, this pt requires psychiatric hospitalization.  Malva LimesLinsey Strader, RN, [facility]; they would like to receive pt before 15:00.  Pt presents under IVC initiated by his father, and upheld by Dr Jannifer FranklinAkintayo, and [facility]  Pt's nurse, Morrie Sheldonshley, has been notified, and agrees to call report to [phone].  Pt is to be transported via Patent examinerlaw enforcement.   Doylene Canninghomas Luciann Gossett, MA Triage Specialist [phone]

## 2017-06-05 NOTE — Consult Note (Signed)
[facility] Psychiatry Consult   Reason for Consult:  Anger and aggression Referring Physician:  EDP Patient Identification: Brad Barr MRN:  [id number] Principal Diagnosis: Schizoaffective disorder, bipolar type [facility]) Diagnosis:   Patient Active Problem List   Diagnosis Date Noted  . Schizoaffective disorder, bipolar type ([place]) [F25.0] 06/04/2017    Priority: High  . Involuntary commitment [Z04.6]   . 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 GAD (generalized anxiety disorder) [F41.1] 11/07/2015  . 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 male patient admitted with anger and aggression.  HPI:  Brad Barr is a 41 year old male who presented to the [facility], under IVC, for becoming angry and aggressive at home. IVC paperwork stated he threw groceries around the yard and was threatening toward his family members. Pt denied this actions and stated it was just an argument and that he doesn't know what the fight was about. Pt stated he was not suicidal or homicidal, denies auditory and visual hallucinations and does not appear to be responding to internal stimuli. Pt stated he is only taking Celexa but MAR shows Tegretol and vistaril. Pt would benefit from inpatient psychiatric hospitalization for crisis stabilization and medication management.   Past Psychiatric History: Schizoaffective disorder, GAD, ADHD, Substance or medication induced bipolar disorder  Risk to Self:   Risk to Others:   Prior Inpatient Therapy:   Prior Outpatient Therapy:    Past Medical History:  Past Medical History:  Diagnosis Date  . ADHD (attention deficit hyperactivity disorder)   . Bipolar 1 disorder ([place])   .  Schizophrenia ([place])    Pt requesting this be removed.    No past surgical history on file. Family History:  Family History  Problem Relation Age of Onset  . Mental illness Other   . Mental illness Sister    Family Psychiatric  History: Unknown Social History:  History  Alcohol Use No     History  Drug Use No    Social History   Social History  . Marital status: Single    Spouse name: N/A  . Number of children: N/A  . Years of education: N/A   Social History Main Topics  . Smoking status: Former Research scientist (life sciences)  . Smokeless tobacco: Former Systems developer     Comment: Patient uses vapor.  . Alcohol use No  . Drug use: No  . Sexual activity: No   Other Topics Concern  . Not on file   Social History Narrative  . No narrative on file   Additional Social History:    Allergies:  No Known Allergies  Labs:  Results for orders placed or performed during the hospital encounter of 06/03/17 (from the past 48 hour(s))  Comprehensive metabolic panel     Status: Abnormal   Collection Time: 06/03/17 11:19 PM  Result Value Ref Range   Sodium 138 135 - 145 mmol/L   Potassium 4.0 3.5 - 5.1 mmol/L   Chloride 104 101 - 111 mmol/L   CO2 27 22 - 32 mmol/L   Glucose, Bld 112 (H) 65 - 99 mg/dL   BUN 11 6 - 20 mg/dL   Creatinine, Ser 0.75 0.61 - 1.24 mg/dL   Calcium 9.4 8.9 - 10.3 mg/dL   Total Protein  7.3 6.5 - 8.1 g/dL   Albumin 4.4 3.5 - 5.0 g/dL   AST 29 15 - 41 U/L   ALT 42 17 - 63 U/L   Alkaline Phosphatase 55 38 - 126 U/L   Total Bilirubin 0.7 0.3 - 1.2 mg/dL   GFR calc non Af Amer >60 >60 mL/min   GFR calc Af Amer >60 >60 mL/min    Comment: (NOTE) The eGFR has been calculated using the CKD EPI equation. This calculation has not been validated in all clinical situations. eGFR's persistently <60 mL/min signify possible Chronic Kidney Disease.    Anion gap 7 5 - 15  Ethanol     Status: None   Collection Time: 06/03/17 11:19 PM  Result Value Ref Range   Alcohol, Ethyl (B) <5 <5 mg/dL     Comment:        LOWEST DETECTABLE LIMIT FOR SERUM ALCOHOL IS 5 mg/dL FOR MEDICAL PURPOSES ONLY   Salicylate level     Status: None   Collection Time: 06/03/17 11:19 PM  Result Value Ref Range   Salicylate Lvl <6.2 2.8 - 30.0 mg/dL  Acetaminophen level     Status: Abnormal   Collection Time: 06/03/17 11:19 PM  Result Value Ref Range   Acetaminophen (Tylenol), Serum <10 (L) 10 - 30 ug/mL    Comment:        THERAPEUTIC CONCENTRATIONS VARY SIGNIFICANTLY. A RANGE OF 10-30 ug/mL MAY BE AN EFFECTIVE CONCENTRATION FOR MANY PATIENTS. HOWEVER, SOME ARE BEST TREATED AT CONCENTRATIONS OUTSIDE THIS RANGE. ACETAMINOPHEN CONCENTRATIONS >150 ug/mL AT 4 HOURS AFTER INGESTION AND >50 ug/mL AT 12 HOURS AFTER INGESTION ARE OFTEN ASSOCIATED WITH TOXIC REACTIONS.   cbc     Status: None   Collection Time: 06/03/17 11:19 PM  Result Value Ref Range   WBC 6.5 4.0 - 10.5 K/uL   RBC 5.40 4.22 - 5.81 MIL/uL   Hemoglobin 15.2 13.0 - 17.0 g/dL   HCT 43.5 39.0 - 52.0 %   MCV 80.6 78.0 - 100.0 fL   MCH 28.1 26.0 - 34.0 pg   MCHC 34.9 30.0 - 36.0 g/dL   RDW 13.2 11.5 - 15.5 %   Platelets 191 150 - 400 K/uL  Rapid urine drug screen (hospital performed)     Status: None   Collection Time: 06/04/17  4:23 PM  Result Value Ref Range   Opiates NONE DETECTED NONE DETECTED   Cocaine NONE DETECTED NONE DETECTED   Benzodiazepines NONE DETECTED NONE DETECTED   Amphetamines NONE DETECTED NONE DETECTED   Tetrahydrocannabinol NONE DETECTED NONE DETECTED   Barbiturates NONE DETECTED NONE DETECTED    Comment:        DRUG SCREEN FOR MEDICAL PURPOSES ONLY.  IF CONFIRMATION IS NEEDED FOR ANY PURPOSE, NOTIFY LAB WITHIN 5 DAYS.        LOWEST DETECTABLE LIMITS FOR URINE DRUG SCREEN Drug Class       Cutoff (ng/mL) Amphetamine      1000 Barbiturate      200 Benzodiazepine   947 Tricyclics       654 Opiates          300 Cocaine          300 THC              50     No current facility-administered  medications for this encounter.     Musculoskeletal: Strength & Muscle Tone: within normal limits Gait & Station: normal Patient leans: N/A  Psychiatric Specialty Exam: Physical  Exam  Constitutional: He is oriented to person, place, and time. He appears well-developed and well-nourished.  HENT:  Head: Normocephalic.  Respiratory: Effort normal.  Musculoskeletal: Normal range of motion.  Neurological: He is alert and oriented to person, place, and time.  Psychiatric: His speech is normal and behavior is normal. Thought content normal. Cognition and memory are normal. He expresses impulsivity. He exhibits a depressed mood.    Review of Systems  Psychiatric/Behavioral: Positive for depression. Negative for hallucinations, memory loss, substance abuse and suicidal ideas. The patient is not nervous/anxious and does not have insomnia.     There were no vitals taken for this visit.There is no height or weight on file to calculate BMI.  General Appearance: Casual  Eye Contact:  Good  Speech:  Clear and Coherent and Normal Rate  Volume:  Normal  Mood:  Depressed  Affect:  Congruent and Depressed  Thought Process:  Coherent and Linear  Orientation:  Full (Time, Place, and Person)  Thought Content:  Logical  Suicidal Thoughts:  No  Homicidal Thoughts:  No  Memory:  Immediate;   Fair Recent;   Fair Remote;   Fair  Judgement:  Poor  Insight:  Lacking  Psychomotor Activity:  Normal  Concentration:  Concentration: Fair and Attention Span: Fair  Recall:  [employer] of Knowledge:  Fair  Language:  Good  Akathisia:  No  Handed:  Right  AIMS (if indicated):     Assets:  Agricultural consultant Housing Resilience Social Support  ADL's:  Intact  Cognition:  WNL  Sleep:        Treatment Plan Summary: Daily contact with patient to assess and evaluate symptoms and progress in treatment, Medication management and Plan Schizoaffective Disorder, Bipolar type   Crisis stabilization Continue currently prescribed medications (see MAR)  Disposition: Recommend psychiatric Inpatient admission when medically cleared. Accepted at [employer], NP 06/05/2017 3:49 PM

## 2017-06-05 NOTE — [facility] Suicide Risk Assessment (Signed)
Suicide Risk Assessment  Discharge Assessment   [facility]   Principal Problem: Schizoaffective disorder, bipolar type [facility](HCC) Discharge Diagnoses:  Patient Active Problem List   Diagnosis Date Noted  . Schizoaffective disorder, bipolar type (HCC) [F25.0] 06/04/2017    Priority: High  . Involuntary commitment [Z04.6]   . 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 GAD (generalized anxiety disorder) [F41.1] 11/07/2015  . 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 He appears well-developed and well-nourished.  HENT:  Head: Normocephalic.  Respiratory: Effort normal.  Musculoskeletal: Normal range of motion.  Neurological: He is alert and oriented to person, place, and time.  Psychiatric: His speech is normal and behavior is normal. Thought content normal. Cognition and memory are normal. He expresses impulsivity. He exhibits a depressed mood.   Review of Systems  Psychiatric/Behavioral: Positive for depression. Negative for hallucinations, memory loss, substance abuse and suicidal ideas. The patient is not nervous/anxious and does not have insomnia.    There were no vitals taken for this visit.There is no height or weight on file to calculate BMI. General Appearance: Casual Eye Contact:  Good Speech:  Clear and Coherent and Normal Rate Volume:  Normal Mood:  Depressed Affect:  Congruent and Depressed Thought Process:  Coherent and Linear Orientation:  Full (Time, Place, and Person) Thought Content:   Logical Suicidal Thoughts:  No Homicidal Thoughts:  No Memory:  Immediate;   Fair Recent;   Fair Remote;   Fair Judgement:  Poor Insight:  Lacking Psychomotor Activity:  Normal Concentration:  Concentration: Fair and Attention Span: Fair Recall:  [employer] of Knowledge:  Fair Language:  Good Akathisia:  No Handed:  Right AIMS (if indicated):    Assets:  ArchitectCommunication Skills Financial Resources/Insurance Housing Resilience Social Support ADL's:  Intact Cognition:  WNL  Mental Status Per Nursing Assessment::   On Admission:   depressed  Demographic Factors:  Male  Loss Factors: NA  Historical Factors: Impulsivity  Risk Reduction Factors:   Sense of responsibility to family, Living with another person, especially a relative and Positive social support  Continued Clinical Symptoms:  Depression:   Impulsivity  Cognitive Features That Contribute To Risk:  None    Suicide Risk:  Minimal: No identifiable suicidal ideation.  Patients presenting with no risk factors but with morbid ruminations; may be classified as minimal risk based on the severity of the depressive symptoms    Plan Of Care/Follow-up recommendations:  Activity:  as tolerated Diet:  Heart heallthy  Laveda AbbeLaurie Britton Rehema Muffley, NP 06/05/2017, 4:03 PM

## 2017-06-05 NOTE — Progress Notes (Signed)
Brad BerkshireJohnny is a 41 year old male being admitted involuntarily to [facility] from [facility].  He was IVC'd by his father for convulsions after leaving the grocery store, throwing the groceries all over the yard, not taking his psychiatric medications and threatening his parents.  He has history of multiple psychiatric admissions.  No history of medical issues.  He denied any SI/HI or A/V hallucinations.  He reported that he didn't get upset or stop his medications.  He reported that his goal was to "go home."  Oriented him to the unit.  Admission paperwork completed and signed.  Belongings searched and secured in locker # 22.  Skin assessment completed and no skin issues noted.  Q 15 minute checks initiated for safety.  We will monitor the progress towards his goals.

## 2017-06-05 NOTE — ED Notes (Signed)
[facility] MD order. Pt signed for personal property and property given to sheriff for transport. Pt ambulatory off unit.

## 2017-06-05 NOTE — ED Notes (Signed)
Pt compliant with morning medication regimen. Pt forwards little with this nurse. Not endorsing SI/HI/AVH. Encouragement and support provided. Special checks q 15 mins in place for safety, Video monitoring in place. Will continue to monitor.

## 2017-06-06 DIAGNOSIS — G4700 Insomnia, unspecified: Secondary | ICD-10-CM

## 2017-06-06 DIAGNOSIS — R443 Hallucinations, unspecified: Secondary | ICD-10-CM

## 2017-06-06 MED ORDER — CITALOPRAM HYDROBROMIDE 10 MG PO TABS
10.0000 mg | ORAL_TABLET | Freq: Every day | ORAL | Status: DC
  Administered 2017-06-07 – 2017-06-13 (×7): 10 mg via ORAL
  Filled 2017-06-06 (×9): qty 1

## 2017-06-06 NOTE — Plan of Care (Signed)
Problem: Safety: Goal: Periods of time without injury will increase Outcome: Progressing Patient is on q15 minute safety checks and low fall risk precautions. Patient contracts for safety with staff.   

## 2017-06-06 NOTE — Progress Notes (Signed)
Pt is new to the unit this afternoon.  He tells Clinical research associatewriter that he has been at [facility] in the past.  He has been in the dayroom most of the evening watching TV and talking with peers.  He also attended evening group.  Pt has been pleasant and appropriate with staff.  He makes his needs known to staff.  He voiced no needs or concerns this evening.  He denies SI/HI/AVH.  Writer reviewed the meds that were available to him tonight.  He said that he was fine and did not feel that he would need a sleep aid.  Support and encouragement offered.  Pt encouraged to make his needs known to staff.  Discharge plans are in process.  Safety maintained with q15 minute checks.

## 2017-06-06 NOTE — Tx Team (Signed)
Interdisciplinary Treatment and Diagnostic Plan Update  06/06/2017 Time of Session: 10:55 AM  Brad Barr MRN: [id number]  Principal Diagnosis: Schizoaffective disorder, bipolar type ([place])  Secondary Diagnoses: Principal Problem:   Schizoaffective disorder, bipolar type ([place])   Current Medications:  Current Facility-Administered Medications  Medication Dose Route Frequency Provider Last Rate Last Dose  . acetaminophen (TYLENOL) tablet 650 mg  650 mg Oral Q6H PRN Ethelene Hal, NP      . alum & mag hydroxide-simeth (MAALOX/MYLANTA) 200-200-20 MG/5ML suspension 30 mL  30 mL Oral Q4H PRN Ethelene Hal, NP      . carbamazepine (TEGRETOL) tablet 200 mg  200 mg Oral BID PC Ethelene Hal, NP   200 mg at 06/06/17 0600  . hydrOXYzine (ATARAX/VISTARIL) tablet 25 mg  25 mg Oral Q6H PRN Ethelene Hal, NP      . magnesium hydroxide (MILK OF MAGNESIA) suspension 30 mL  30 mL Oral Daily PRN Ethelene Hal, NP      . nicotine polacrilex (NICORETTE) gum 2 mg  2 mg Oral PRN Ursula Alert, MD      . traZODone (DESYREL) tablet 50 mg  50 mg Oral QHS PRN Ethelene Hal, NP        PTA Medications: Prescriptions Prior to Admission  Medication Sig Dispense Refill Last Dose  . carbamazepine (TEGRETOL) 200 MG tablet Take 1 tablet (200 mg total) by mouth 2 (two) times daily after a meal. (Patient not taking: Reported on 06/03/2017) 60 tablet 3 Not Taking at Unknown time  . citalopram (CELEXA) 20 MG tablet Take 1 tablet (20 mg total) by mouth daily. 30 tablet 3 06/03/2017 at Unknown time  . finasteride (PROSCAR) 5 MG tablet Take 5 mg by mouth daily.   06/03/2017 at Unknown time  . hydrOXYzine (ATARAX/VISTARIL) 25 MG tablet Take 1 tablet (25 mg total) by mouth 3 (three) times daily as needed for anxiety. (Patient not taking: Reported on 06/03/2017) 30 tablet 3 Not Taking at Unknown time    Patient Stressors: Financial difficulties Medication change or  noncompliance  Patient Strengths: General fund of knowledge Physical Health Supportive family/friends  Treatment Modalities: Medication Management, Group therapy, Case management,  1 to 1 session with clinician, Psychoeducation, Recreational therapy.   Physician Treatment Plan for Primary Diagnosis: Schizoaffective disorder, bipolar type ([place]) Long Term Goal(s): Improvement in symptoms so as ready for discharge  Short Term Goals:    Medication Management: Evaluate patient's response, side effects, and tolerance of medication regimen.  Therapeutic Interventions: 1 to 1 sessions, Unit Group sessions and Medication administration.  Evaluation of Outcomes: Progressing  Physician Treatment Plan for Secondary Diagnosis: Principal Problem:   Schizoaffective disorder, bipolar type ([place])   Long Term Goal(s): Improvement in symptoms so as ready for discharge  Short Term Goals:    Medication Management: Evaluate patient's response, side effects, and tolerance of medication regimen.  Therapeutic Interventions: 1 to 1 sessions, Unit Group sessions and Medication administration.  Evaluation of Outcomes: Progressing   RN Treatment Plan for Primary Diagnosis: Schizoaffective disorder, bipolar type ([place]) Long Term Goal(s): Knowledge of disease and therapeutic regimen to maintain health will improve  Short Term Goals: Ability to identify and develop effective coping behaviors will improve and Compliance with prescribed medications will improve  Medication Management: RN will administer medications as ordered by provider, will assess and evaluate patient's response and provide education to patient for prescribed medication. RN will report any adverse and/or side effects to prescribing provider.  Therapeutic Interventions:  1 on 1 counseling sessions, Psychoeducation, Medication administration, Evaluate responses to treatment, Monitor vital signs and CBGs as ordered, Perform/monitor CIWA, COWS,  AIMS and Fall Risk screenings as ordered, Perform wound care treatments as ordered.  Evaluation of Outcomes: Progressing    Recreational Therapy Treatment Plan for Primary Diagnosis: Schizoaffective disorder, bipolar type ([place]) Long Term Goal(s): Patient will participate in recreation therapy treatment in at least 2 group sessions without prompting from LRT  Short Term Goals: Patient will demonstrate increased ability to follow instructions, as demonstrated by ability to follow LRT instructions on first prompt during recreation therapy group sessions  Treatment Modalities: Group and Pet Therapy  Therapeutic Interventions: Psychoeducation  Evaluation of Outcomes: Progressing   LCSW Treatment Plan for Primary Diagnosis: Schizoaffective disorder, bipolar type ([place]) Long Term Goal(s): Safe transition to appropriate next level of care at discharge, Engage patient in therapeutic group addressing interpersonal concerns.  Short Term Goals: Engage patient in aftercare planning with referrals and resources  Therapeutic Interventions: Assess for all discharge needs, 1 to 1 time with Social worker, Explore available resources and support systems, Assess for adequacy in community support network, Educate family and significant other(s) on suicide prevention, Complete Psychosocial Assessment, Interpersonal group therapy.  Evaluation of Outcomes: Met  Return home, follow up Family Services   Progress in Treatment: Attending groups: No Participating in groups: No Taking medication as prescribed: Yes Toleration medication: Yes, no side effects reported at this time Family/Significant other contact made: No Patient understands diagnosis: No Limited insight Discussing patient identified problems/goals with staff: Yes Medical problems stabilized or resolved: Yes Denies suicidal/homicidal ideation: Yes Issues/concerns per patient self-inventory: None Other: N/A  New problem(s) identified: None  identified at this time.   New Short Term/Long Term Goal(s): "My father IVC'd me again.  It's all family stuff. I'll be here until you are OK with sending me home."  Discharge Plan or Barriers:   Reason for Continuation of Hospitalization: Aggression Mood Instability Medication stabilization   Estimated Length of Stay: 5 days  Attendees: Patient: Brad Barr 06/06/2017  10:55 AM  Physician: Neita Garnet, MD 06/06/2017  10:55 AM  Nursing: Sena Hitch, RN 06/06/2017  10:55 AM  RN Care Manager: Lars Pinks, RN 06/06/2017  10:55 AM  Social Worker: Ripley Fraise 06/06/2017  10:55 AM  Recreational Therapist: Victorino Sparrow, LRT/CTRS 06/06/2017  10:55 AM  Other: Norberto Sorenson 06/06/2017  10:55 AM  Other:  06/06/2017  10:55 AM    Scribe for Treatment Team:  Roque Lias LCSW 06/06/2017 10:55 AM

## 2017-06-06 NOTE — Progress Notes (Signed)
Nursing Progress Note 1900-0730  D) Patient presents with flat affect and appears anxious. Patient is observed up in the dayroom watching television but does not interact with peers. Patient is minimal with staff and forwards little. Patient denies SI/HI/AVH or pain. Patient contracts for safety on the unit. Patient denies need for sleep medication.  A) Emotional support given. 1:1 interaction and active listening provided. Patient medicated as prescribed. No medications ordered/requested this shift. Snacks and fluids provided. Opportunities for questions or concerns presented to patient. Patient encouraged to continue to work on treatment goals. Labs, vital signs and patient behavior monitored throughout shift. Patient safety maintained with q15 min safety checks. Low fall risk precautions in place and reviewed with patient; patient verbalized understanding.  R) Patient remains safe on the unit at this time. Patient is watching television in the dayroom. Will continue to monitor.

## 2017-06-06 NOTE — H&P (Signed)
Psychiatric Admission Assessment Adult  Patient Identification: Brad Barr MRN:  [id number] Date of Evaluation:  06/06/2017  Chief Complaint: Aggressive behaviors.    Principal Diagnosis: Schizoaffective disorder, bipolar type (HCC) BZD, Stimulants ; R/O Intermittent explosive disorder   Diagnosis:   Patient Active Problem List   Diagnosis Date Noted  . Involuntary commitment [Z04.6]   . Schizoaffective disorder, bipolar type (HCC) [F25.0] 06/04/2017  . 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 GAD (generalized anxiety disorder) [F41.1] 11/07/2015  . 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 States " I had an argument with my family and they IVCd me". As per chart notes, patient was IVCd by parents , reporting patient became irate, rageful, threw groceries around the yard. At this time patient presents calm, pleasant, and minimizes episode. States "It was just an argument, and I did not throw any groceries, it was just that the grocery bag tore".  Patient states " I have been stable, I take my medications as I am supposed to, I am doing well".  Of note, in the past has been diagnosed with Substance Induced Mood Disorder, Stimulant and BZD Use Disorders. Patient states "I think all I really have is depression".  Patient denies any drug or alcohol abuse, and admission BAL is <5, UDS is negative. He has been admitted to our unit in the past (1/18) for similar circumstances (IVC generated by parents reporting aggression, agitation, anger).  At the time patient was discharged on Celexa and Tegretol. Denies side effects.   Associated  Signs/Symptoms: Depression Symptoms:  "I have depression, but I think I'm doing okay"  (Hypo) Manic Symptoms:  Hx. of Impulsivity, irritable mood, mood liability. However, patient presents calm & non-aggressive during this assessment.   Anxiety Symptoms:  Excessive Worry,  Psychotic Symptoms:  Currently denies any hallucinations, delusional thoughts or paranoia.   PTSD Symptoms: Denies any PTSD symptoms or events.  Total Time spent with patient: 1 hour  Past Psychiatric History: Hx of [facility] for substance induced mood sx as well as substance abuse. Hx of  noncompliant with patient care and medication regimen.Pt denies any suicide attempts  Is the patient at risk to self? Yes.   pt was aggressive at home Has the patient been a risk to self in the past 6 months? Yes.    Has the patient been a risk to self within the distant past? Yes.    Is the patient a risk to others? Yes.    Has the patient been a risk to others in the past 6 months? Yes.    Has the patient been a risk to others within the distant past? Yes.     Prior Inpatient Therapy: Yes, [facility] x multiple times.  Prior Outpatient Therapy: Yes  Alcohol Screening: 1. How often do you have a drink containing alcohol?: Never 9. Have you or someone else been injured as a result of your drinking?: No 10. Has a relative or friend or a doctor or another health worker been concerned about your drinking or suggested you cut down?: No Alcohol Use Disorder Identification Test Final Score (AUDIT): 0 Brief Intervention: AUDIT score less than 7 or less-screening does not suggest unhealthy drinking-brief intervention not  indicated  Substance Abuse History in the last 12 months:  Yes.  (Hx. Benzodiazepine & stimulant use stimulants).  Consequences of Substance Abuse: Medical Consequences:  Liver damage, Possible death by overdose Legal Consequences:  Arrests, jail time, Loss of driving privilege. Family Consequences:   Family discord, divorce and or separation.  Previous Psychotropic Medications: Yes adderall, xanax   Psychological Evaluations: No   Past Medical History:  Past Medical History:  Diagnosis Date  . ADHD (attention deficit hyperactivity disorder)   . Bipolar 1 disorder (HCC)   . Schizophrenia (HCC)    Pt requesting this be removed.    History reviewed. No pertinent surgical history.  Family History:  Family History  Problem Relation Age of Onset  . Mental illness Other   . Mental illness Sister    Family Psychiatric  History: Substance abuse: Sister  Tobacco Screening: Have you used any form of tobacco in the last 30 days? (Cigarettes, Smokeless Tobacco, Cigars, and/or Pipes): Yes Tobacco use, Select all that apply: smokeless tobacco use daily (vape) Are you interested in Tobacco Cessation Medications?: Yes, will notify MD for an order Counseled patient on smoking cessation including recognizing danger situations, developing coping skills and basic information about quitting provided: Refused/Declined practical counseling  Social History: Patient is single, lives with parents who are supportive. History  Alcohol Use No     History  Drug Use No    Additional Social History: Pain Medications: SEE MAR Prescriptions: SEE MAR Over the Counter: SEE MAR History of alcohol / drug use?: No history of alcohol / drug abuse Longest period of sobriety (when/how long): denies current usage  Allergies:  No Known Allergies Lab Results:  Results for orders placed or performed during the hospital encounter of 06/03/17 (from the past 48 hour(s))  Rapid urine drug screen (hospital performed)     Status: None   Collection Time: 06/04/17  4:23 PM  Result Value Ref Range   Opiates NONE DETECTED NONE DETECTED   Cocaine NONE DETECTED NONE DETECTED   Benzodiazepines NONE DETECTED NONE DETECTED   Amphetamines NONE DETECTED NONE DETECTED   Tetrahydrocannabinol NONE DETECTED NONE DETECTED    Barbiturates NONE DETECTED NONE DETECTED    Comment:        DRUG SCREEN FOR MEDICAL PURPOSES ONLY.  IF CONFIRMATION IS NEEDED FOR ANY PURPOSE, NOTIFY LAB WITHIN 5 DAYS.        LOWEST DETECTABLE LIMITS FOR URINE DRUG SCREEN Drug Class       Cutoff (ng/mL) Amphetamine      1000 Barbiturate      200 Benzodiazepine   200 Tricyclics       300 Opiates          300 Cocaine          300 THC              50    Blood Alcohol level:  Lab Results  Component Value Date   ETH <5 06/03/2017   ETH <5 12/15/2016   Metabolic Disorder Labs:  Lab Results  Component Value Date   HGBA1C 5.3 10/18/2016   MPG 105 10/18/2016   MPG 111 11/07/2015   Lab Results  Component Value Date   PROLACTIN 35.3 (H) 10/18/2016   PROLACTIN 79.3 (H) 02/23/2016   Lab Results  Component Value Date   CHOL 206 (H) 10/18/2016   TRIG 96 10/18/2016   HDL 40 (L) 10/18/2016   CHOLHDL 5.2 10/18/2016   VLDL 19 10/18/2016   LDLCALC  147 (H) 10/18/2016   LDLCALC 116 (H) 11/07/2015   Current Medications: Current Facility-Administered Medications  Medication Dose Route Frequency Provider Last Rate Last Dose  . acetaminophen (TYLENOL) tablet 650 mg  650 mg Oral Q6H PRN Laveda Abbe, NP      . alum & mag hydroxide-simeth (MAALOX/MYLANTA) 200-200-20 MG/5ML suspension 30 mL  30 mL Oral Q4H PRN Laveda Abbe, NP      . carbamazepine (TEGRETOL) tablet 200 mg  200 mg Oral BID PC Laveda Abbe, NP   200 mg at 06/06/17 1610  . hydrOXYzine (ATARAX/VISTARIL) tablet 25 mg  25 mg Oral Q6H PRN Laveda Abbe, NP      . magnesium hydroxide (MILK OF MAGNESIA) suspension 30 mL  30 mL Oral Daily PRN Laveda Abbe, NP      . nicotine polacrilex (NICORETTE) gum 2 mg  2 mg Oral PRN Jomarie Longs, MD      . traZODone (DESYREL) tablet 50 mg  50 mg Oral QHS PRN Laveda Abbe, NP       PTA Medications: Prescriptions Prior to Admission  Medication Sig Dispense Refill Last Dose  .  carbamazepine (TEGRETOL) 200 MG tablet Take 1 tablet (200 mg total) by mouth 2 (two) times daily after a meal. (Patient not taking: Reported on 06/03/2017) 60 tablet 3 Not Taking at Unknown time  . citalopram (CELEXA) 20 MG tablet Take 1 tablet (20 mg total) by mouth daily. 30 tablet 3 06/03/2017 at Unknown time  . finasteride (PROSCAR) 5 MG tablet Take 5 mg by mouth daily.   06/03/2017 at Unknown time  . hydrOXYzine (ATARAX/VISTARIL) 25 MG tablet Take 1 tablet (25 mg total) by mouth 3 (three) times daily as needed for anxiety. (Patient not taking: Reported on 06/03/2017) 30 tablet 3 Not Taking at Unknown time   Musculoskeletal: Strength & Muscle Tone: within normal limits Gait & Station: normal Patient leans: N/A  Psychiatric Specialty Exam: Physical Exam  Constitutional: He appears well-developed.  HENT:  Head: Normocephalic.  Eyes: Pupils are equal, round, and reactive to light.  Neck: Normal range of motion.  Cardiovascular: Normal rate.   Respiratory: Effort normal.  GI: Soft.  Genitourinary:  Genitourinary Comments: Deferred  Musculoskeletal: Normal range of motion.  Neurological: He is alert.  Skin: Skin is warm.    Review of Systems  Constitutional: Negative.   HENT: Negative.   Eyes: Negative.   Respiratory: Negative.   Cardiovascular: Negative.   Gastrointestinal: Negative.   Genitourinary: Negative.   Musculoskeletal: Negative.   Skin: Negative.   Neurological: Negative.   Endo/Heme/Allergies: Negative.   Psychiatric/Behavioral: Positive for depression and hallucinations. Negative for memory loss and substance abuse. The patient is nervous/anxious and has insomnia.   All other systems reviewed and are negative.   Blood pressure 114/81, pulse 63, temperature 97.7 F (36.5 C), temperature source Oral, resp. rate 18, height 6\' 3"  (1.905 m), weight 107 kg (236 lb).Body mass index is 29.5 kg/m.  General Appearance: Fairly Groomed  Eye Contact:  Good  Speech:  Normal  Rate  Volume:  Normal  Mood:  reports " my mood is good, 10/10"  Affect:  subtly irritable,smiles at times appropriately  Thought Process:  Linear and Descriptions of Associations: Intact  Orientation:  Full (Time, Place, and Person)  Thought Content:  no hallucinations, no delusions,not internally preoccupied   Suicidal Thoughts:  No denies any suicidal or self injurious ideations, denies homicidal or violent ideations  Homicidal Thoughts:  No  Memory:  recent and remote grossly intact  Judgement:  Other:  fair  Insight:  Fair  Psychomotor Activity:  Normal  Concentration:  Concentration: Good and Attention Span: Good  Recall:  Good  Fund of Knowledge:  Good  Language:  Good  Akathisia:  No  Handed:  Right  AIMS (if indicated):     Assets:  Communication Skills Desire for Improvement Resilience  ADL's:  Intact  Cognition:  WNL  Sleep:  Number of Hours: 6     Treatment Plan/Recommendations: 1. Admit for crisis management and stabilization, estimated length of stay 3-5 days.  2. Medication management to reduce current symptoms to base line and improve the patient's overall level of functioning: See MAR, Md's SRA & treatment plan.  3. Treat health problems as indicated.  4. Develop treatment plan to decrease risk of relapse upon discharge and the need for readmission.  5. Psycho-social education regarding relapse prevention and self care.  6. Health care follow up as needed for medical problems.  7. Review, reconcile, and reinstate any pertinent home medications for other health issues where appropriate. 8. Call for consults with hospitalist for any additional specialty patient care services as needed.  Observation Level/Precautions:  15 minute checks  Labs: Per ED, UDS negative of all substances.  Psychotherapy: Group sessions  Medications: See MAR  Consultations: As needed  Discharge Concerns: Safety, mood stability.  Length of stay: 5-7 days  Other: Admit to the  [address]-Hall.   Physician Treatment Plan for Primary Diagnosis: Schizoaffective disorder, bipolar type (HCC)  Long Term Goal(s): Improvement in symptoms so as ready for discharge  Short Term Goals: Ability to identify changes in lifestyle to reduce recurrence of condition will improve, Ability to verbalize feelings will improve and Ability to demonstrate self-control will improve  Physician Treatment Plan for Secondary Diagnosis: Principal Problem:   Schizoaffective disorder, bipolar type (HCC)  Long Term Goal(s): Improvement in symptoms so as ready for discharge  Short Term Goals: Ability to identify and develop effective coping behaviors will improve, Compliance with prescribed medications will improve and Ability to identify triggers associated with substance abuse/mental health issues will improve  I certify that inpatient services furnished can reasonably be expected to improve the patient's condition.    Sanjuana Kava, NP, PMHNP, FNP-BC 6/29/201812:03 PM

## 2017-06-06 NOTE — Progress Notes (Signed)
Recreation Therapy Notes  Date: 06/06/17 Time: 1000 Location: 500 Hall Dayroom  Group Topic: Leisure Education  Goal Area(s) Addresses:  Patient will identify positive leisure activities.  Patient will identify one positive benefit of participation in leisure activities.   Intervention: Markers, scissors, construction paper, tape/glue, magazines  Activity: Leisure PSA.  Patients were to create Barr public service announcement to explain the benefits of leisure.  Patients were to also show what kinds of activities can be done for leisure enjoyment.  Education:  Leisure Education, Discharge Planning  Education Outcome: Acknowledges education/In group clarification offered/Needs additional education  Clinical Observations/Feedback: Pt did not attend group.   Brad Barr, LRT/CTRS         Brad Barr 06/06/2017 12:42 PM 

## 2017-06-06 NOTE — Progress Notes (Signed)
DAR NOTE: Patient presents with calm affect and depressed mood.  Denies pain, auditory and visual hallucinations.  Rates depression at 0, hopelessness at 0, and anxiety at 0.  Maintained on routine safety checks.  Medications given as prescribed.  Support and encouragement offered as needed.  States goal for today is "discharge."  Patient withdrawn and isolative to his room most of this shift.  No interaction with staff or peers.  Minimizes symptoms during assessment.  Offered no complaint.

## 2017-06-06 NOTE — Plan of Care (Signed)
Problem: Coping: Goal: Ability to interact with others will improve Outcome: Not Progressing Patient is isolative and withdrawn to self.  Patient encouraged to be visible in milieu for activity and therapy.

## 2017-06-06 NOTE — Progress Notes (Signed)
Recreation Therapy Notes  INPATIENT RECREATION THERAPY ASSESSMENT  Patient Details Name: Brad KeyJohnny Larrabee MRN: [id number] DOB: [birth year] Today's Date: 06/06/2017  Patient Stressors: Family  Pt stated he was here for a family argument.  Coping Skills:   Avoidance, Exercise  Personal Challenges:  (Pt did not identify any personal challenges.)  Leisure Interests (2+):  Individual - TV  Awareness of Community Resources:  Yes  Community Resources:  [employer], Research scientist (physical sciences)Movie Theaters  Current Use: Yes  Patient Strengths:  Prayer; family  Patient Identified Areas of Improvement:  Friends  Current Recreation Participation:  "Not often"  Patient Goal for Hospitalization:  "Get out"  [place]  [place]  Current [place] (including self-harm):  No  Current HI:  No  Consent to Intern Participation: N/A   Caroll RancherMarjette Kylei Purington, LRT/CTRS  Caroll RancherLindsay, Randale Carvalho A 06/06/2017, 2:17 PM

## 2017-06-06 NOTE — B[facility] Counselor (Addendum)
Adult Comprehensive Assessment  Patient ID: Brad Barr, male   DOB: 27-Nov-1976, 41 y.o.   MRN: [id number]   Information Source: Information source: Patient  Current Stressors:  Educational / Learning stressors: high school graduate Employment / Job issues: states he has applied for diability-has an appointment with Dr in the next couple of weeks Family Relationships: strainedwithmother and father-was IVC'd by father, which is typical Surveyor, quantity / Lack of resources (include bankruptcy): dependent on others Housing / Lack of housing: Lives with parents  Physical health (include injuries & life threatening diseases): no concerns Social relationships: socially isolated, "no friends" Substance abuse:  UDS is negative   Living/Environment/Situation:  Living Arrangements: Lives with parents  Living conditions (as described by patient or guardian): "Up and down"  How long has patient lived in current situation?: 1 year What is atmosphere in current home: Comfortable, Chaotic  Family History:  Marital status: Single Are you sexually active?: No What is your sexual orientation?: would not discuss Has your sexual activity been affected by drugs, alcohol, medication, or emotional stress?: would not discuss Does patient have children?: No  Childhood History:  By whom was/is the patient raised?: Both parents Description of patient's relationship with caregiver when they were a child: "good" Patient's description of current relationship with people who raised him/her: "they keep putting me in this place-how would you feel?" Does patient have siblings?: Yes Number of Siblings: 2 Description of patient's current relationship with siblings: brother and sister, doesnt see much of them Did patient suffer any verbal/emotional/physical/sexual abuse as a child?: No Did patient suffer from severe childhood neglect?: No Has patient ever been sexually abused/assaulted/raped as an adolescent or  adult?: No Was the patient ever a victim of a crime or a disaster?: No Witnessed domestic violence?: No Has patient been effected by domestic violence as an adult?: No  Education:  Highest grade of school patient has completed: high school graduate Currently a Consulting civil engineer?: No Learning disability?: No  Employment/Work Situation:  Employment situation: Unemployed How long has patient been employed?: 2007 Patient's job has been impacted by current illness: No What is the longest time patient has a held a job?: 8 years 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 Has patient ever been in the [employer]?: No Has patient ever served in combat?: No Did You Receive Any Psychiatric Treatment/Services While in Equities trader?: No Are There Guns or Other Weapons in Your Home?: No  Financial Resources:  Surveyor, quantity resources:  Support from parents/caregivers Does patient have a Lawyer or guardian?: No  Alcohol/Substance Abuse:  What has been your use of drugs/alcohol within the last 12 months?: UDS negative If attempted suicide, did drugs/alcohol play a role in this?: No Alcohol/Substance Abuse Treatment Hx: Denies past history Has alcohol/substance abuse ever caused legal problems?: No  Social Support System: Forensic psychologist System: Poor Describe Community Support System: "I dont have any friends" Type of faith/religion: Ephriam Knuckles How does patient's faith help to cope with current illness?: reads Bible and attends church  Leisure/Recreation:  Leisure and Hobbies: reading Bible  Strengths/Needs:  What things does the patient do well?: "I dont know" In what areas does patient struggle / problems for patient: "I dont know"  Discharge Plan:  Does patient have access to transportation?: Yes Will patient be returning to same living situation after discharge?: Yes Currently receiving community mental health services: No If no,  would patient like referral for services when discharged?: Yes (What county?) ([facility] According to their records, he has not been there since last October. He states he is getting refills through the ED, and in fact, he visited there in April and got a 1 week supply of Celexa. Does patient have financial barriers related to discharge medications?: No (despite lack of insurance, pt denies issues with affording medications)     Summary/Recommendations:   Summary and Recommendations (to be completed by the evaluator): Brad Barr is a 41 YO Caucasian male diagnosed with substance or medication induced Bipolar D/O.  Per usual, he was IVC'd by parents for erratic, threatening and destructive behavior.  Per usual, he minimizes his behavior, stating that  "it is overblown" and "a family issue." Plans tor return home with parents at d/c, [facility]  In the meantime, Brad Barr can benefit from crises stabilization, medication management, therapeutic milieu and referral for services.   Brad Barr. 06/06/2017

## 2017-06-06 NOTE — [facility] Suicide Risk Assessment (Signed)
[facility] INPATIENT:  Family/Significant Other Suicide Prevention Education  Suicide Prevention Education:  Patient Refusal for Family/Significant Other Suicide Prevention Education: The patient Brad Barr has refused to provide written consent for family/significant other to be provided Family/Significant Other Suicide Prevention Education during admission and/or prior to discharge.  Physician notified.  Brad Barr 06/06/2017, 10:55 AM

## 2017-06-06 NOTE — [facility] Suicide Risk Assessment (Signed)
[facility] Admission Suicide Risk Assessment   Nursing information obtained from:  Patient Demographic factors:  Male Current Mental Status:  NA Loss Factors:  NA Historical Factors:  NA Risk Reduction Factors:  Living with another person, especially a relative  Total Time spent with patient: 45 minutes Principal Problem: Schizoaffective disorder, bipolar type (HCC) Diagnosis:   Patient Active Problem List   Diagnosis Date Noted  . Involuntary commitment [Z04.6]   . Schizoaffective disorder, bipolar type (HCC) [F25.0] 06/04/2017  . 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 GAD (generalized anxiety disorder) [F41.1] 11/07/2015  . 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  World Science writer [facility]). Score between 0-7:  no or low risk or alcohol related problems. Score between 8-15:  moderate risk of alcohol related problems. Score between 16-19:  high risk of alcohol related problems. Score 20 or above:  warrants further diagnostic evaluation for alcohol dependence and treatment.   CLINICAL FACTORS:  41 year old single male, no children, lives with family, currently unemployed. States " I had an argument with my family and they IVCd me". As per chart notes, patient was IVCd by parents , reporting patient became irate, rageful, threw groceries around the yard. At this time patient presents calm, pleasant, and minimizes episode. States " It was just an argument, and I did not throw any groceries , it was just that the grocery bag tore".  Patient states " I have been stable, I take my  medications as I am supposed to , I am doing well ". Of note, in the past has been diagnosed with Substance Induced Mood Disorder, Stimulant and BZD Use Disorders . Patient states " I think all I really have is depression".  Patient denies any drug or alcohol abuse, and admission BAL is <5, UDS is negative . He has been admitted to our unit in the past ( 1/18) for similar circumstances ( IVC generated by parents reporting aggression, agitation, anger) At the time patient was discharged on Celexa and Tegretol. Denies side effects.   Denies medical illnesses .  Dx- Consider Intermittent Explosive Disorder. Mood Disorder, Unspecified, history of Stimulant Use Disorder.  Plan- inpatient admission- has been restarted on Tegretol 200 mgrs BID, start Celexa 10 mgrs QDAY, which he states " may have been working ".      Musculoskeletal: Strength & Muscle Tone: within normal limits Gait & Station: normal Patient leans: N/A  Psychiatric Specialty Exam: Physical Exam  ROS no headache, no chest pain, no shortness of breath, no vomiting   Blood pressure 114/81, pulse 63, temperature 97.7 F (36.5 C), temperature source Oral, resp. rate 18, height 6\' 3"  (1.905 m), weight 107 kg (236 lb).Body mass index is 29.5 kg/m.  General Appearance: Fairly Groomed  Eye Contact:  Good  Speech:  Normal Rate  Volume:  Normal  Mood:  reports " my mood is good, 10/10"  Affect:  subtly irritable,smiles at times appropriately  Thought Process:  Linear and Descriptions of Associations: Intact  Orientation:  Full (Time, Place, and Person)  Thought Content:  no hallucinations, no delusions,not internally preoccupied   Suicidal Thoughts:  No denies any suicidal or self injurious ideations, denies  homicidal or violent ideations  Homicidal Thoughts:  No  Memory:  recent and remote grossly intact  Judgement:  Other:  fair  Insight:  Fair  Psychomotor Activity:  Normal  Concentration:  Concentration: Good and Attention  Span: Good  Recall:  Good  Fund of Knowledge:  Good  Language:  Good  Akathisia:  No  Handed:  Right  AIMS (if indicated):     Assets:  Communication Skills Desire for Improvement Resilience  ADL's:  Intact  Cognition:  WNL  Sleep:  Number of Hours: 6      COGNITIVE FEATURES THAT CONTRIBUTE TO RISK:  Closed-mindedness and Loss of executive function    SUICIDE RISK:   Mild:  Suicidal ideation of limited frequency, intensity, duration, and specificity.  There are no identifiable plans, no associated intent, mild dysphoria and related symptoms, good self-control (both objective and subjective assessment), few other risk factors, and identifiable protective factors, including available and accessible social support.  PLAN OF CARE: Patient will be admitted to inpatient psychiatric unit for stabilization and safety. Will provide and encourage milieu participation. Provide medication management and maked adjustments as needed.  Will follow daily.    I certify that inpatient services furnished can reasonably be expected to improve the patient's condition.   Craige CottaFernando A Cobos, MD 06/06/2017, 1:18 PM

## 2017-06-07 DIAGNOSIS — F39 Unspecified mood [affective] disorder: Secondary | ICD-10-CM

## 2017-06-07 NOTE — [facility] Group Notes (Signed)
[facility] Group Notes: (Clinical Social Work)   06/07/2017      Type of Therapy:  Group Therapy   Participation Level:  Did Not Attend despite MHT prompting   Marchella Hibbard Grossman-Orr, LCSW 06/07/2017, 12:56 PM     

## 2017-06-07 NOTE — Progress Notes (Signed)
Psychoeducational Group Note  Date:  06/07/2017 Time:  2107  Group Topic/Focus:  Wrap-Up Group:   The focus of this group is to help patients review their daily goal of treatment and discuss progress on daily workbooks.  Participation Level: Did Not Attend  Participation Quality:  Not Applicable  Affect:  Not Applicable  Cognitive:  Not Applicable  Insight:  Not Applicable  Engagement in Group: Not Applicable  Additional Comments:  The patient did not attend group this evening since he did not feel "well enough" to attend.   Rylie Limburg S 06/07/2017, 9:07 PM

## 2017-06-07 NOTE — Progress Notes (Signed)
DAR NOTE: Patient presents with anxious affect and depressed mood.  Denies pain, auditory and visual hallucinations.  Described energy level as normal and concentration as good.  Rates depression at 0, hopelessness at 0, and anxiety at 0.  Maintained on routine safety checks.  Medications given as prescribed.  Support and encouragement offered as needed.  Attended group and participated.  States goal for today is "having a good day."  Patient visible in milieu watching TV and not interacting with peers.  Staff found a hole in the shower during environmental and safety checks.  Patient assessed for safety.  No injury noted.  Patient denied behavior.  Charisse MarchLaura D., NP made aware.

## 2017-06-07 NOTE — Plan of Care (Signed)
Problem: Safety: Goal: Periods of time without injury will increase Outcome: Progressing Patient is free from injury.  Routine safety checks maintained every 15 minutes.   

## 2017-06-07 NOTE — Progress Notes (Signed)
[facility]  06/07/2017 12:53 PM Brad KeyJohnny Barr  MRN:  [id number] Subjective:    Patient states "I was not doing anything wrong. Maybe I did throw some groceries around. I'm not Bipolar either. I'm doing fine today."   Objective:   Brad Barr is a 41 year old single male, no children, lives with family, currently unemployed. States " I had an argument with my family and they IVCd me". As per chart notes, patient was IVCd by parents , reporting patient became irate, rageful, threw groceries around the yard. At this time patient presents calm, pleasant, and minimizes episode. States "It was just an argument, and I did not throw any groceries, it was just that the grocery bag tore". Patient states " I have been stable, I take my medications as I am supposed to, I am doing well".  Of note, in the past has been diagnosed with Substance Induced Mood Disorder, Stimulant and BZD Use Disorders. Patient states "I think all I really have is depression".  Patient denies any drug or alcohol abuse, and admission BAL is <5, UDS is negative. He has been admitted to our unit in the past (1/18) for similar circumstances (IVC generated by parents reporting aggression, agitation, anger).  At the time patient was discharged on Celexa and Tegretol. Patient is observed in his room in bed during much of the morning sleeping. The patient greatly minimizes the events [facility]   Principal Problem: Schizoaffective disorder, bipolar type (HCC) Diagnosis:   Patient Active Problem List   Diagnosis Date Noted  . Involuntary commitment [Z04.6]   . Schizoaffective disorder, bipolar type (HCC) [F25.0] 06/04/2017  . 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 GAD (generalized  anxiety disorder) [F41.1] 11/07/2015  . 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 ADHD (attention deficit hyperactivity disorder)   . Bipolar 1 disorder (HCC)   . Schizophrenia (HCC)    Pt requesting this be removed.    History reviewed. No pertinent surgical history. Family History:  Family History  Problem Relation Age of Onset  . Mental illness Other   . Mental illness Sister    Family Psychiatric  History: See H & P Social History:  History  Alcohol Use No     History  Drug Use No    Social History   Social History  . Marital status: Single    Spouse name: N/A  . Number of children: N/A  . Years of education: N/A   Social History Main Topics  . Smoking status: Former Games developermoker  . Smokeless tobacco: Former NeurosurgeonUser     Comment: Patient uses vapor.  . Alcohol use No  . Drug use: No  . Sexual activity: No   Other Topics Concern  . None   Social History Narrative  . None   Additional Social History:    Pain Medications: SEE MAR Prescriptions: SEE MAR Over the Counter: SEE MAR History of alcohol / drug use?: No history of alcohol / drug abuse Longest period of sobriety (when/how long): denies current usage  Sleep: Fair  Appetite:  Fair  Current Medications: Current Facility-Administered Medications  Medication Dose Route Frequency Provider Last Rate Last Dose  . acetaminophen (TYLENOL) tablet 650 mg  650 mg Oral Q6H PRN Laveda Abbe, NP      . alum & mag hydroxide-simeth (MAALOX/MYLANTA) 200-200-20 MG/5ML suspension 30 mL  30 mL Oral Q4H PRN Laveda Abbe, NP      . carbamazepine (TEGRETOL) tablet 200 mg  200 mg Oral BID PC Laveda Abbe, NP   200 mg at 06/07/17 0755  . citalopram (CELEXA) tablet 10 mg  10 mg Oral Daily  Cobos, Rockey Situ, MD   10 mg at 06/07/17 0755  . hydrOXYzine (ATARAX/VISTARIL) tablet 25 mg  25 mg Oral Q6H PRN Laveda Abbe, NP      . magnesium hydroxide (MILK OF MAGNESIA) suspension 30 mL  30 mL Oral Daily PRN Laveda Abbe, NP      . nicotine polacrilex (NICORETTE) gum 2 mg  2 mg Oral PRN Jomarie Longs, MD      . traZODone (DESYREL) tablet 50 mg  50 mg Oral QHS PRN Laveda Abbe, NP        Lab Results: No results found for this or any previous visit (from the past 48 hour(s)).  Blood Alcohol level:  Lab Results  Component Value Date   ETH <5 06/03/2017   ETH <5 12/15/2016    Metabolic Disorder Labs: Lab Results  Component Value Date   HGBA1C 5.3 10/18/2016   MPG 105 10/18/2016   MPG 111 11/07/2015   Lab Results  Component Value Date   PROLACTIN 35.3 (H) 10/18/2016   PROLACTIN 79.3 (H) 02/23/2016   Lab Results  Component Value Date   CHOL 206 (H) 10/18/2016   TRIG 96 10/18/2016   HDL 40 (L) 10/18/2016   CHOLHDL 5.2 10/18/2016   VLDL 19 10/18/2016   LDLCALC 147 (H) 10/18/2016   LDLCALC 116 (H) 11/07/2015    Physical Findings: AIMS: Facial and Oral Movements Muscles of Facial Expression: None, normal Lips and Perioral Area: None, normal Jaw: None, normal Tongue: None, normal,Extremity Movements Upper (arms, wrists, hands, fingers): None, normal Lower (legs, knees, ankles, toes): None, normal, Trunk Movements Neck, shoulders, hips: None, normal, Overall Severity Severity of abnormal movements (highest score from questions above): None, normal Incapacitation due to abnormal movements: None, normal Patient's awareness of abnormal movements (rate only patient's report): No Awareness, Dental Status Current problems with teeth and/or dentures?: No Does patient usually wear dentures?: No  CIWA:    COWS:     Musculoskeletal: Strength & Muscle Tone: within normal limits Gait & Station: normal Patient leans: N/A  Psychiatric Specialty  Exam: Physical Exam  Review of Systems  Psychiatric/Behavioral: Positive for depression.    Blood pressure 110/73, pulse 74, temperature 98.4 F (36.9 C), temperature source Oral, resp. rate 20, height 6\' 3"  (1.905 m), weight 107 kg (236 lb).Body mass index is 29.5 kg/m.  General Appearance: Disheveled  Eye Contact:  Minimal  Speech:  Clear and Coherent and Slow  Volume:  Decreased  Mood:  Dysphoric  Affect:  Constricted and Depressed  Thought Process:  Coherent and Goal Directed  Orientation:  Full (Time, Place, and Person)  Thought Content:  no hallucinations, no delusions,not internally preoccupied   Suicidal Thoughts:  No  Homicidal Thoughts:  No  Memory:  Immediate;   Fair Recent;   Poor Remote;   Poor  Judgement:  Impaired  Insight:  Lacking  Psychomotor Activity:  Decreased  Concentration:  Concentration: Good and Attention Span: Good  Recall:  Good  Fund of Knowledge:  Good  Language:  Good  Akathisia:  No  Handed:  Right  AIMS (if indicated):     Assets:  Communication Skills Desire for Improvement Leisure Time Physical Health Resilience Social Support  ADL's:  Intact  Cognition:  WNL  Sleep:  Number of Hours: 6.25     Treatment Plan Summary: Daily contact with patient to assess and evaluate symptoms and progress in treatment and Medication management   -Continue Celexa 10 mg daily for depression -Continue Tegretol 200 mg po bid for mood stabilization   Physician Treatment Plan for Primary Diagnosis: Schizoaffective disorder, bipolar type (HCC)  Long Term Goal(s): Improvement in symptoms so as ready for discharge  Short Term Goals: Ability to identify changes in lifestyle to reduce recurrence of condition will improve, Ability to verbalize feelings will improve and Ability to demonstrate self-control will improve  Physician Treatment Plan for Secondary Diagnosis: Principal Problem:   Schizoaffective disorder, bipolar type (HCC)  Long Term  Goal(s): Improvement in symptoms so as ready for discharge  Short Term Goals: Ability to identify and develop effective coping behaviors will improve, Compliance with prescribed medications will improve and Ability to identify triggers associated with substance abuse/mental health issues will improve  DAVIS, LAURA, NP 06/07/2017, 12:53 PM   Notes reviewed and agree with plan

## 2017-06-07 NOTE — Progress Notes (Signed)
D.  Pt remained in bed this evening, did not get up to attend group.  Minimal interaction on unit.  Pt denies SI/HI/AVH at this time.  A.  Support and encouragement offered  R.  Pt remains safe on the unit, will continue to monitor.

## 2017-06-08 DIAGNOSIS — Z046 Encounter for general psychiatric examination, requested by authority: Secondary | ICD-10-CM

## 2017-06-08 DIAGNOSIS — Z87891 Personal history of nicotine dependence: Secondary | ICD-10-CM

## 2017-06-08 DIAGNOSIS — Z818 Family history of other mental and behavioral disorders: Secondary | ICD-10-CM

## 2017-06-08 DIAGNOSIS — F250 Schizoaffective disorder, bipolar type: Principal | ICD-10-CM

## 2017-06-08 NOTE — Progress Notes (Signed)
[facility] MD Progress Note  06/08/2017 11:49 AM Brad Barr  MRN:  [id number] Subjective:    Patient states doing ok; does not elaborate much on admission or reason. States he has had anger issue  Objective:   Brad Barr is a 41 year old single male, no children, lives with family, currently unemployed. States " I had an argument with my family and they IVCd me". As per chart notes, patient was IVCd by parents , reporting patient became irate, rageful, threw groceries around the yard. At this time patient presents calm, pleasant, and minimizes episode.  On evaluation today, calm, minimizes the reason for being here.  Cooperative does not express much anger  Endorses feeling down but not hopeless.  Wants to be on SR tegretol if single dose would work but he is on total of 400mg  per day   Principal Problem: Schizoaffective disorder, bipolar type (HCC) Diagnosis:   Patient Active Problem List   Diagnosis Date Noted  . Involuntary commitment [Z04.6]   . Schizoaffective disorder, bipolar type (HCC) [F25.0] 06/04/2017  . 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 GAD (generalized anxiety disorder) [F41.1] 11/07/2015  . 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 ADHD (attention deficit hyperactivity disorder)   . Bipolar 1 disorder (HCC)   . Schizophrenia (HCC)    Pt requesting this be removed.    History reviewed. No pertinent surgical history. Family History:  Family History  Problem Relation Age of Onset  . Mental illness Other   . Mental illness Sister    Family Psychiatric  History: See H & P Social  History:  History  Alcohol Use No     History  Drug Use No    Social History   Social History  . Marital status: Single    Spouse name: N/A  . Number of children: N/A  . Years of education: N/A   Social History Main Topics  . Smoking status: Former Games developer  . Smokeless tobacco: Former Neurosurgeon     Comment: Patient uses vapor.  . Alcohol use No  . Drug use: No  . Sexual activity: No   Other Topics Concern  . None   Social History Narrative  . None   Additional Social History:    Pain Medications: SEE MAR Prescriptions: SEE MAR Over the Counter: SEE MAR History of alcohol / drug use?: No history of alcohol / drug abuse Longest period of sobriety (when/how long): denies current usage                    Sleep: Fair  Appetite:  Fair  Current Medications: Current Facility-Administered Medications  Medication Dose Route Frequency Provider Last Rate Last Dose  . acetaminophen (TYLENOL) tablet 650 mg  650 mg Oral Q6H PRN Laveda Abbe, NP      . alum & mag hydroxide-simeth (MAALOX/MYLANTA) 200-200-20 MG/5ML suspension 30 mL  30 mL Oral Q4H PRN Laveda Abbe, NP      . carbamazepine (TEGRETOL) tablet 200 mg  200 mg Oral BID PC Laveda Abbe, NP   200 mg at 06/08/17 0756  . citalopram (CELEXA) tablet 10 mg  10 mg Oral  Daily Cobos, Rockey SituFernando A, MD   10 mg at 06/08/17 0756  . hydrOXYzine (ATARAX/VISTARIL) tablet 25 mg  25 mg Oral Q6H PRN Laveda AbbeParks, Laurie Britton, NP      . magnesium hydroxide (MILK OF MAGNESIA) suspension 30 mL  30 mL Oral Daily PRN Laveda AbbeParks, Laurie Britton, NP      . nicotine polacrilex (NICORETTE) gum 2 mg  2 mg Oral PRN Jomarie LongsEappen, Saramma, MD      . traZODone (DESYREL) tablet 50 mg  50 mg Oral QHS PRN Laveda AbbeParks, Laurie Britton, NP        Lab Results: No results found for this or any previous visit (from the past 48 hour(s)).  Blood Alcohol level:  Lab Results  Component Value Date   ETH <5 06/03/2017   ETH <5 12/15/2016    Metabolic  Disorder Labs: Lab Results  Component Value Date   HGBA1C 5.3 10/18/2016   MPG 105 10/18/2016   MPG 111 11/07/2015   Lab Results  Component Value Date   PROLACTIN 35.3 (H) 10/18/2016   PROLACTIN 79.3 (H) 02/23/2016   Lab Results  Component Value Date   CHOL 206 (H) 10/18/2016   TRIG 96 10/18/2016   HDL 40 (L) 10/18/2016   CHOLHDL 5.2 10/18/2016   VLDL 19 10/18/2016   LDLCALC 147 (H) 10/18/2016   LDLCALC 116 (H) 11/07/2015    Physical Findings: AIMS: Facial and Oral Movements Muscles of Facial Expression: None, normal Lips and Perioral Area: None, normal Jaw: None, normal Tongue: None, normal,Extremity Movements Upper (arms, wrists, hands, fingers): None, normal Lower (legs, knees, ankles, toes): None, normal, Trunk Movements Neck, shoulders, hips: None, normal, Overall Severity Severity of abnormal movements (highest score from questions above): None, normal Incapacitation due to abnormal movements: None, normal Patient's awareness of abnormal movements (rate only patient's report): No Awareness, Dental Status Current problems with teeth and/or dentures?: No Does patient usually wear dentures?: No  CIWA:    COWS:     Musculoskeletal: Strength & Muscle Tone: within normal limits Gait & Station: normal Patient leans: N/A  Psychiatric Specialty Exam: Physical Exam  Constitutional: He appears well-developed and well-nourished.    Review of Systems  Cardiovascular: Negative for chest pain.  Psychiatric/Behavioral: Positive for depression.    Blood pressure 114/71, pulse 68, temperature 97.6 F (36.4 C), temperature source Oral, resp. rate 16, height 6\' 3"  (1.905 m), weight 107 kg (236 lb).Body mass index is 29.5 kg/m.  General Appearance: Disheveled  Eye Contact:  Minimal  Speech:  Clear and Coherent and Slow  Volume:  Decreased  Mood:  dysphoric  Affect:  congruent  Thought Process:  Coherent and Goal Directed  Orientation:  Full (Time, Place, and Person)   Thought Content:  no hallucinations, no delusions,not internally preoccupied   Suicidal Thoughts:  No  Homicidal Thoughts:  No  Memory:  Immediate;   Fair Recent;   Poor Remote;   Poor  Judgement:  Impaired  Insight:  Lacking  Psychomotor Activity:  Decreased  Concentration:  Concentration: Good and Attention Span: Good  Recall:  Good  Fund of Knowledge:  Good  Language:  Good  Akathisia:  No  Handed:  Right  AIMS (if indicated):     Assets:  Communication Skills Desire for Improvement Leisure Time Physical Health Resilience Social Support  ADL's:  Intact  Cognition:  WNL  Sleep:  Number of Hours: 4.75     Treatment Plan Summary: Daily contact with patient to assess and evaluate symptoms and progress in  treatment and Medication management   -Continue Celexa 10 mg daily for depression -Continue Tegretol 200 mg po bid for mood stabilization   Physician Treatment Plan for Primary Diagnosis: Schizoaffective disorder, bipolar type (HCC) no treatment change done today. Reviewed meds and concerns.  Long Term Goal(s): Improvement in symptoms so as ready for discharge  Short Term Goals: Ability to identify changes in lifestyle to reduce recurrence of condition will improve, Ability to verbalize feelings will improve and Ability to demonstrate self-control will improve  Physician Treatment Plan for Secondary Diagnosis: Principal Problem:   Schizoaffective disorder, bipolar type (HCC)  Long Term Goal(s): Improvement in symptoms so as ready for discharge  Short Term Goals: Ability to identify and develop effective coping behaviors will improve, Compliance with prescribed medications will improve and Ability to identify triggers associated with substance abuse/mental health issues will improve  Thresa Ross, MD 06/08/2017, 11:49 AM

## 2017-06-08 NOTE — [facility] Group Notes (Signed)
[facility] Group Notes: (Clinical Social Work)   06/08/2017      Type of Therapy:  Group Therapy   Participation Level:  Did Not Attend despite MHT prompting   Ambrose MantleMareida Grossman-Orr, LCSW 06/08/2017, 12:26 PM

## 2017-06-08 NOTE — Plan of Care (Signed)
Problem: Activity: Goal: Interest or engagement in activities will improve Outcome: Not Progressing Pt has not attended groups today.   Problem: Safety: Goal: Periods of time without injury will increase Outcome: Progressing Pt remains free from harm.

## 2017-06-08 NOTE — Progress Notes (Signed)
D: Pt has been cooperative today and took all scheduled meds. He denied SI, HI, and AVH. He rated his depression, anxiety, and hopelessness all zero/10 on his self-inventory. He hasn't been active in the milieu much, choosing instead to rest in his room, but he did get up to go for lunch. He denies concerns other than the desire to be on SR Tegretol so he could only take this once daily (he spoke with MD about this).   A: Meds given as ordered. Q15 safety checks maintained. Support/encouragement offered.  R: Pt remains free from harm and continues with treatment. Will continue to monitor for needs/safety.

## 2017-06-08 NOTE — Progress Notes (Signed)
D.  Pt presents with flat affect but pleasant on approach, denies complaints at this time.  Pt did attend evening wrap up group, minimal interaction with peers on unit.  Pt observed pacing in hall, but no distress noted.  Pt denies SI/HI/AVH at this time.  A.  Support and encouragement offered  R.  Pt remains safe on the unit, will continue to monitor.

## 2017-06-09 NOTE — Progress Notes (Signed)
D:  Patient's self inventory sheet, patient sleeps good, no sleep medication given.  Good appetite, normal energy level, good concentration.  Denied depression, anxiety, hopeless.  Denied withdrawals.  Denied SI.  Denied physical problems.  Denied physical pain.  Goal is have a good day.  Plans to have good day.  Does have discharge plans. A:  Medications administered per MD orders.  Emotional support and encouragement given patient. R:  Denied SI and HI, contracts for safety.  Denied A/V hallucinations.  Safety maintained with 15 minute checks.

## 2017-06-09 NOTE — Progress Notes (Signed)
Adult Psychoeducational Group Note  Date:  06/09/2017 Time:  1:29 AM  Group Topic/Focus:  Wrap-Up Group:   The focus of this group is to help patients review their daily goal of treatment and discuss progress on daily workbooks.  Participation Level:  Minimal  Participation Quality:  Appropriate  Affect:  Appropriate  Cognitive:  Appropriate  Insight: Limited  Engagement in Group:  Limited  Modes of Intervention:  Discussion  Additional Comments: Pt stated his goal for today was focus on spending more time with his peer instead of in his room. Pt stated that he felt he acheive his goal today. Pt stated that his goal for tomorrow is to interact more with peers.  Brad FurnaceChristopher  [name] 06/09/2017, 1:29 AM

## 2017-06-09 NOTE — [facility] Suicide Risk Assessment (Signed)
[facility] INPATIENT:  Family/Significant Other Suicide Prevention Education  Suicide Prevention Education:  Education Completed; Mr Fortino Sicunn, father, [phone] has been identified by the patient as the family member/significant other with whom the patient will be residing, and identified as the person(s) who will aid the patient in the event of a mental health crisis (suicidal ideations/suicide attempt).  With written consent from the patient, the family member/significant other has been provided the following suicide prevention education, prior to the and/or following the discharge of the patient.  The suicide prevention education provided includes the following:  Suicide risk factors  Suicide prevention and interventions  National Suicide Hotline telephone number  [facility] assessment telephone number  [facility] Assistance 911  [facility] and/or [facility]idential [facility] telephone number  Request made of family/significant other to:  Remove weapons (e.g., guns, rifles, knives), all items previously/currently identified as safety concern.    Remove drugs/medications (over-the-counter, prescriptions, illicit drugs), all items previously/currently identified as a safety concern.  The family member/significant other verbalizes understanding of the suicide prevention education information provided.  The family member/significant other agrees to remove the items of safety concern listed above. Father shared that pt demonstrates paranoia at home and is non-compliant with meds on an outpt basis.  Pt has now been here 7 times since 2016.  He is hoping we can get pt on injectable, and make referral to ACT team.  Ida Rogueodney B Yoshie Kosel 06/09/2017, 3:34 PM

## 2017-06-09 NOTE — Progress Notes (Signed)
[facility] MD Progress Note  06/09/2017 2:33 PM Brad Barr  MRN:  [id number] Subjective:  Pt states " I did not mean to , it just happened."   Objective: Patient seen and chart reviewed.Discussed patient with treatment team.  Pt today seen as anxious , withdrawn to self . Pt attempts to minimize his sx as well as his recent presentation that led to his admission to [facility]. Pt reports he did not go to his outpatient provider , instead went to [facility] for a refill of his medications. He continues to deny any mood lability , substance abuse issues..  CSW to obtain collateral information from father who IVC ed patient - pt signed a release giving permission.      Principal Problem: Schizoaffective disorder, bipolar type (HCC) Diagnosis:   Patient Active Problem List   Diagnosis Date Noted  . Involuntary commitment [Z04.6]   . Schizoaffective disorder, bipolar type (HCC) [F25.0] 06/04/2017  . 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 GAD (generalized anxiety disorder) [F41.1] 11/07/2015  . 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  Past Psychiatric History: Stimulant Use Disorder, Possible Bipolar Disorder   Past Medical History:  Past Medical History:  Diagnosis Date  . ADHD (attention deficit hyperactivity disorder)   . Bipolar 1 disorder (HCC)   . Schizophrenia (HCC)    Pt requesting this be removed.    History reviewed. No pertinent surgical history. Family History:  Family History  Problem Relation Age of Onset  . Mental illness Other   . Mental illness Sister    Family Psychiatric  History: See H & P Social History:  History  Alcohol Use No     History  Drug Use No    Social History   Social History  . Marital status: Single     Spouse name: N/A  . Number of children: N/A  . Years of education: N/A   Social History Main Topics  . Smoking status: Former Games developer  . Smokeless tobacco: Former Neurosurgeon     Comment: Patient uses vapor.  . Alcohol use No  . Drug use: No  . Sexual activity: No   Other Topics Concern  . None   Social History Narrative  . None   Additional Social History:    Pain Medications: SEE MAR Prescriptions: SEE MAR Over the Counter: SEE MAR History of alcohol / drug use?: No history of alcohol / drug abuse Longest period of sobriety (when/how long): denies current usage                    Sleep: Fair  Appetite:  Fair  Current Medications: Current Facility-Administered Medications  Medication Dose Route Frequency Provider Last Rate Last Dose  . acetaminophen (TYLENOL) tablet 650 mg  650 mg Oral Q6H PRN Laveda Abbe, NP      . alum & mag hydroxide-simeth (MAALOX/MYLANTA) 200-200-20 MG/5ML suspension 30 mL  30 mL Oral Q4H PRN Laveda Abbe, NP      . carbamazepine (TEGRETOL) tablet 200 mg  200 mg Oral BID PC Laveda Abbe, NP   200 mg at 06/09/17 1191  . citalopram (CELEXA) tablet 10 mg  10 mg Oral Daily Cobos, Rockey Situ, MD   10 mg at 06/09/17 4782  . hydrOXYzine (ATARAX/VISTARIL) tablet 25 mg  25 mg Oral Q6H PRN Laveda Abbe, NP      . magnesium hydroxide (MILK OF MAGNESIA) suspension 30 mL  30 mL Oral Daily PRN Laveda Abbe, NP      . nicotine polacrilex (NICORETTE) gum 2 mg  2 mg Oral PRN Jomarie Longs, MD      . traZODone (DESYREL) tablet 50 mg  50 mg Oral QHS PRN Laveda Abbe, NP        Lab Results: No results found for this or any previous visit (from the past 48 hour(s)).  Blood Alcohol level:  Lab Results  Component Value Date   ETH <5 06/03/2017   ETH <5 12/15/2016    Metabolic Disorder Labs: Lab Results  Component Value Date   HGBA1C 5.3 10/18/2016   MPG 105 10/18/2016   MPG 111 11/07/2015   Lab  Results  Component Value Date   PROLACTIN 35.3 (H) 10/18/2016   PROLACTIN 79.3 (H) 02/23/2016   Lab Results  Component Value Date   CHOL 206 (H) 10/18/2016   TRIG 96 10/18/2016   HDL 40 (L) 10/18/2016   CHOLHDL 5.2 10/18/2016   VLDL 19 10/18/2016   LDLCALC 147 (H) 10/18/2016   LDLCALC 116 (H) 11/07/2015    Physical Findings: AIMS: Facial and Oral Movements Muscles of Facial Expression: None, normal Lips and Perioral Area: None, normal Jaw: None, normal Tongue: None, normal,Extremity Movements Upper (arms, wrists, hands, fingers): None, normal Lower (legs, knees, ankles, toes): None, normal, Trunk Movements Neck, shoulders, hips: None, normal, Overall Severity Severity of abnormal movements (highest score from questions above): None, normal Incapacitation due to abnormal movements: None, normal Patient's awareness of abnormal movements (rate only patient's report): No Awareness, Dental Status Current problems with teeth and/or dentures?: No Does patient usually wear dentures?: No  CIWA:    COWS:     Musculoskeletal: Strength & Muscle Tone: within normal limits Gait & Station: normal Patient leans: N/A  Psychiatric Specialty Exam: Physical Exam  Nursing note and vitals reviewed.   Review of Systems  Psychiatric/Behavioral: The patient is nervous/anxious.   All other systems reviewed and are negative.   Blood pressure 115/75, pulse 70, temperature 98.7 F (37.1 C), resp. rate 16, height 6\' 3"  (1.905 m), weight 107 kg (236 lb).Body mass index is 29.5 kg/m.  General Appearance: Guarded  Eye Contact:  Fair  Speech:  Clear and Coherent  Volume:  Decreased  Mood:  dysphoric  Affect:  congruent  Thought Process:  Goal Directed and Descriptions of Associations: Circumstantial  Orientation:  Full (Time, Place, and Person)  Thought Content:  no hallucinations, no delusions,not internally preoccupied   Suicidal Thoughts:  No  Homicidal Thoughts:  No  Memory:  Immediate;    Fair Recent;   Fair Remote;   Fair  Judgement:  Impaired  Insight:  Lacking  Psychomotor Activity:  Decreased  Concentration:  Concentration: Fair and Attention Span: Fair  Recall:  [employer] of Knowledge:  Fair  Language:  Fair  Akathisia:  No  Handed:  Right  AIMS (if indicated):     Assets:  Manufacturing systems engineer Social Support  ADL's:  Intact  Cognition:  WNL  Sleep:  Number of Hours: 6.75   Schizoaffective disorder, bipolar type (HCC)  unstable  Will continue today 06/09/17  plan as below except where it is noted.    Treatment Plan Summary:Patient with hx of aggression , substance abuse issues in the past , noncompliant on medications , was IVC ed  by father for aggressive sx at home , will need treatment and stay.  Daily contact with patient to assess and evaluate symptoms and progress in treatment and Medication management   -Continue Celexa 10 mg daily for depression -Continue Tegretol 200 mg po bid for mood stabilization  -CSW to get collateral information from family. -Encourage to attend groups CSW to work on disposition.  Aubert Choyce, MD 06/09/2017, 2:33 PM

## 2017-06-09 NOTE — Progress Notes (Signed)
Recreation Therapy Notes  Date: 06/09/17 Time: 1000 Location: 500 Hall Dayroom  Group Topic: Coping Skills  Goal Area(s) Addresses:  Patient will be able to identify coping skills. Patient will be able to identify the benefits of positive coping skills. Patient will be able to identify benefits of using coping skills post d/c.  Intervention: Web design, pencils  Activity: Web Design.  Patients were given a blank spider web worksheet.  Patients were to identify the situations that had gotten them "stuck" and brought them to the hospital.  Patients were to then identify 3 coping skills for each issue they identified that had gotten them "stuck".  Education: Coping Skills, Discharge Planning.   Education Outcome: Acknowledges understanding/In group clarification offered/Needs additional education.   Clinical Observations/Feedback: Pt did not attend group.   Lucrezia Dehne, LRT/CTRS         Ronney Honeywell A 06/09/2017 1:38 PM 

## 2017-06-09 NOTE — [facility] Group Notes (Signed)
[facility] LCSW Group Therapy  06/09/2017 1:15 pm  Type of Therapy: Process Group Therapy  Participation Level:  Active  Participation Quality:  Appropriate  Affect:  Flat  Cognitive:  Oriented  Insight:  Improving  Engagement in Group:  Limited  Engagement in Therapy:  Limited  Modes of Intervention:  Activity, Clarification, Education, Problem-solving and Support  Summary of Progress/Problems: Today's group addressed the issue of overcoming obstacles.  Patients were asked to identify their biggest obstacle post d/c that stands in the way of their on-going success, and then problem solve as to how to manage this. Stayed the entire time, engaged throughout.  "I'm just here listening to others."  No meaningful contribution to the conversation.  Ida Rogueorth, Jinny Sweetland B 06/09/2017   3:39 PM

## 2017-06-10 NOTE — Plan of Care (Signed)
Problem: Coping: Goal: Ability to identify and develop effective coping behavior will improve Outcome: Progressing Nurse discussed depression/anxiety/coping skills with patient.    

## 2017-06-10 NOTE — Progress Notes (Signed)
[facility] MD Progress Note  06/10/2017 2:48 PM Brad Barr  MRN:  [id number] Subjective:  Pt states " I am ok."    Objective: Patient seen and chart reviewed.Discussed patient with treatment team. Pt continues to have poor insight , states he is here due to his dad. Does state he lost his temper , however , he has had multiple admissions in the past similar reasons. He always get discharged and ends up back in few months due to noncompliance. Pt reports he does not want to be on an LAI. He has been taking the tegretol and celexa , denies ADRs. Continue to encourage and support.        Principal Problem: Schizoaffective disorder, bipolar type (HCC) Diagnosis:   Patient Active Problem List   Diagnosis Date Noted  . Involuntary commitment [Z04.6]   . Schizoaffective disorder, bipolar type (HCC) [F25.0] 06/04/2017  . 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 GAD (generalized anxiety disorder) [F41.1] 11/07/2015  . 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  Past Psychiatric History: Stimulant Use Disorder, Possible Bipolar Disorder   Past Medical History:  Past Medical History:  Diagnosis Date  . ADHD (attention deficit hyperactivity disorder)   . Bipolar 1 disorder (HCC)   . Schizophrenia (HCC)    Pt requesting this be removed.    History reviewed. No pertinent surgical history. Family History:  Family History  Problem Relation Age of Onset  . Mental illness Other   . Mental illness Sister    Family Psychiatric  History: See H & P Social History:  History  Alcohol Use No     History  Drug Use No    Social History   Social History  . Marital status: Single    Spouse name: N/A  . Number of children: N/A  . Years of  education: N/A   Social History Main Topics  . Smoking status: Former Games developer  . Smokeless tobacco: Former Neurosurgeon     Comment: Patient uses vapor.  . Alcohol use No  . Drug use: No  . Sexual activity: No   Other Topics Concern  . None   Social History Narrative  . None   Additional Social History:    Pain Medications: SEE MAR Prescriptions: SEE MAR Over the Counter: SEE MAR History of alcohol / drug use?: No history of alcohol / drug abuse Longest period of sobriety (when/how long): denies current usage                    Sleep: Fair  Appetite:  Fair  Current Medications: Current Facility-Administered Medications  Medication Dose Route Frequency Provider Last Rate Last Dose  . acetaminophen (TYLENOL) tablet 650 mg  650 mg Oral Q6H PRN Laveda Abbe, NP      . alum & mag hydroxide-simeth (MAALOX/MYLANTA) 200-200-20 MG/5ML suspension 30 mL  30 mL Oral Q4H PRN Laveda Abbe, NP      . carbamazepine (TEGRETOL) tablet 200 mg  200 mg Oral BID PC Laveda Abbe, NP   200 mg at 06/10/17 0818  . citalopram (CELEXA) tablet 10 mg  10 mg Oral Daily Cobos, Rockey Situ, MD   10 mg at 06/10/17 0818  . hydrOXYzine (ATARAX/VISTARIL) tablet 25 mg  25 mg Oral Q6H PRN Laveda Abbe, NP      .  magnesium hydroxide (MILK OF MAGNESIA) suspension 30 mL  30 mL Oral Daily PRN Laveda Abbe, NP      . nicotine polacrilex (NICORETTE) gum 2 mg  2 mg Oral PRN Jomarie Longs, MD      . traZODone (DESYREL) tablet 50 mg  50 mg Oral QHS PRN Laveda Abbe, NP        Lab Results: No results found for this or any previous visit (from the past 48 hour(s)).  Blood Alcohol level:  Lab Results  Component Value Date   ETH <5 06/03/2017   ETH <5 12/15/2016    Metabolic Disorder Labs: Lab Results  Component Value Date   HGBA1C 5.3 10/18/2016   MPG 105 10/18/2016   MPG 111 11/07/2015   Lab Results  Component Value Date   PROLACTIN 35.3 (H) 10/18/2016    PROLACTIN 79.3 (H) 02/23/2016   Lab Results  Component Value Date   CHOL 206 (H) 10/18/2016   TRIG 96 10/18/2016   HDL 40 (L) 10/18/2016   CHOLHDL 5.2 10/18/2016   VLDL 19 10/18/2016   LDLCALC 147 (H) 10/18/2016   LDLCALC 116 (H) 11/07/2015    Physical Findings: AIMS: Facial and Oral Movements Muscles of Facial Expression: None, normal Lips and Perioral Area: None, normal Jaw: None, normal Tongue: None, normal,Extremity Movements Upper (arms, wrists, hands, fingers): None, normal Lower (legs, knees, ankles, toes): None, normal, Trunk Movements Neck, shoulders, hips: None, normal, Overall Severity Severity of abnormal movements (highest score from questions above): None, normal Incapacitation due to abnormal movements: None, normal Patient's awareness of abnormal movements (rate only patient's report): No Awareness, Dental Status Current problems with teeth and/or dentures?: No Does patient usually wear dentures?: No  CIWA:  CIWA-Ar Total: 1 COWS:  COWS Total Score: 1  Musculoskeletal: Strength & Muscle Tone: within normal limits Gait & Station: normal Patient leans: N/A  Psychiatric Specialty Exam: Physical Exam  Nursing note and vitals reviewed.   Review of Systems  Psychiatric/Behavioral: The patient is nervous/anxious.   All other systems reviewed and are negative.   Blood pressure 114/80, pulse 64, temperature 98 F (36.7 C), temperature source Oral, resp. rate (!) 21, height 6\' 3"  (1.905 m), weight 107 kg (236 lb).Body mass index is 29.5 kg/m.  General Appearance: Guarded  Eye Contact:  Fair  Speech:  Clear and Coherent  Volume:  Decreased  Mood:  anxious  Affect:  congruent  Thought Process:  Goal Directed and Descriptions of Associations: Circumstantial  Orientation:  Full (Time, Place, and Person)  Thought Content:  rumination  Suicidal Thoughts:  No  Homicidal Thoughts:  No  Memory:  Immediate;   Fair Recent;   Fair Remote;   Fair  Judgement:   Impaired  Insight:  Lacking  Psychomotor Activity:  Decreased  Concentration:  Concentration: Fair and Attention Span: Fair  Recall:  [employer] of Knowledge:  Fair  Language:  Fair  Akathisia:  No  Handed:  Right  AIMS (if indicated):     Assets:  Manufacturing systems engineer Social Support  ADL's:  Intact  Cognition:  WNL  Sleep:  Number of Hours: 3   Schizoaffective disorder, bipolar type (HCC) improving  Will continue today 06/10/17  plan as below except where it is noted.    Treatment Plan Summary:Patient with hx of mood lability, substance abuse , aggressive episodes at home , noncompliance with medications , continues to need treatment.  Daily contact with patient to assess and evaluate symptoms and progress in  treatment and Medication management   -Continue Celexa 10 mg daily for depression -Continue Tegretol 200 mg po bid for mood stabilization  Pt declined LAI. -Encourage to attend groups CSW to work on disposition.  Pola Furno, MD 06/10/2017, 2:48 PM

## 2017-06-10 NOTE — [facility] Group Notes (Signed)
[facility] LCSW Group Therapy 06/10/2017 11:00 AM  Type of Therapy: Group Therapy- Feelings about Diagnosis  Participation Level: Reserved  Participation Quality:  Attentive  Affect:  Appropriate  Cognitive: Alert and Oriented   Insight:  Unable to assess  Engagement in Therapy: Limited  Modes of Intervention: Clarification, Confrontation, Discussion, Education, Exploration, Limit-setting, Orientation, Problem-solving, Rapport Building, Dance movement psychotherapisteality Testing, Socialization and Support  Description of Group:   This group will allow patients to explore their thoughts and feelings about diagnoses they have received. Patients will be guided to explore their level of understanding and acceptance of these diagnoses. Facilitator will encourage patients to process their thoughts and feelings about the reactions of others to their diagnosis, and will guide patients in identifying ways to discuss their diagnosis with significant others in their lives. This group will be process-oriented, with patients participating in exploration of their own experiences as well as giving and receiving support and challenge from other group members.  Summary of Progress/Problems:  Pt declined to participate when prompted and requested to "just listen."  Therapeutic Modalities:   Cognitive Behavioral Therapy Solution Focused Therapy Motivational Interviewing Relapse Prevention Therapy  Vernie ShanksLauren Celestia Duva, LCSW 06/10/2017 2:48 PM

## 2017-06-10 NOTE — Progress Notes (Signed)
Recreation Therapy Notes  Animal-Assisted Activity (AAA) Program Checklist/Progress Notes Patient Eligibility Criteria Checklist & Daily Group note for Rec Tx Intervention  Date: 07.03.2018 Time: 3:00pm [address]    AAA/T Program Assumption of Risk Form signed by Patient/ or Parent Legal Guardian No  Behavioral Response: Did not attend.    Clinical Observations/Feedback: Patient discussed with MD for appropriateness in pet therapy session. Both LRT and MD agree patient is appropriate for participation. Patient offered participation in session, however politely declined invitation. Due to patient declining offer to participate no consent form signed at this time. Patient pleasant on approach.   Marykay Lexenise L Gwendalynn Eckstrom, LRT/CTRS       Tekisha Darcey L 06/10/2017 3:20 PM

## 2017-06-10 NOTE — Progress Notes (Signed)
Recreation Therapy Notes  Date: 06/10/2017 Time: 10:00am Location: [address]  Group Topic: Self-Expression  Goal Area(s) Addresses:  Patient will identify things that define who they are.  Behavioral Response: Minimal  Intervention: Art  Activity: Patients asked to identify things that define who they are by drawing pictures or writing words inside of a blank head outline. Once all patients completed their picture they were each asked to identify the top 5 things that defined them.  Education:  Self-Expression, Building control surveyorDischarge Planning.   Education Outcome: Acknowledges education  Clinical Observations/Feedback: Patient participated in activity by drawing a picture of a sleeping face, but did not write words or draw pictures to identify what defined him. Pt identified "me, myself and I" as what makes up who he is and allowed other group members to help him identify things that define him. Pt then decided he would rather not share what defines him.  Marvell Fullerachel Meyer, Recreation Therapy Intern  Caroll RancherMarjette Gwendolyn Mclees, LRT/CTRS

## 2017-06-10 NOTE — Progress Notes (Signed)
  D: Pt appeared uninterested in having a conversation with the Clinical research associatewriter. Pt was anxious and hurriedly responded to questions. Pt has no questions or concerns.   A:  Support and encouragement was offered. 15 min checks continued for safety.  R: Pt remains safe.

## 2017-06-10 NOTE — Tx Team (Signed)
Interdisciplinary Treatment and Diagnostic Plan Update  06/10/2017 Time of Session: 9:30am Brad Barr MRN: [id number]  Principal Diagnosis: Schizoaffective disorder, bipolar type [facility])  Secondary Diagnoses: Principal Problem:   Schizoaffective disorder, bipolar type (HCC)   Current Medications:  Current Facility-Administered Medications  Medication Dose Route Frequency Provider Last Rate Last Dose  . acetaminophen (TYLENOL) tablet 650 mg  650 mg Oral Q6H PRN Laveda Abbe, NP      . alum & mag hydroxide-simeth (MAALOX/MYLANTA) 200-200-20 MG/5ML suspension 30 mL  30 mL Oral Q4H PRN Laveda Abbe, NP      . carbamazepine (TEGRETOL) tablet 200 mg  200 mg Oral BID PC Laveda Abbe, NP   200 mg at 06/10/17 0818  . citalopram (CELEXA) tablet 10 mg  10 mg Oral Daily Cobos, Rockey Situ, MD   10 mg at 06/10/17 0818  . hydrOXYzine (ATARAX/VISTARIL) tablet 25 mg  25 mg Oral Q6H PRN Laveda Abbe, NP      . magnesium hydroxide (MILK OF MAGNESIA) suspension 30 mL  30 mL Oral Daily PRN Laveda Abbe, NP      . nicotine polacrilex (NICORETTE) gum 2 mg  2 mg Oral PRN Jomarie Longs, MD      . traZODone (DESYREL) tablet 50 mg  50 mg Oral QHS PRN Laveda Abbe, NP        PTA Medications: Prescriptions Prior to Admission  Medication Sig Dispense Refill Last Dose  . carbamazepine (TEGRETOL) 200 MG tablet Take 1 tablet (200 mg total) by mouth 2 (two) times daily after a meal. (Patient not taking: Reported on 06/03/2017) 60 tablet 3 Not Taking at Unknown time  . citalopram (CELEXA) 20 MG tablet Take 1 tablet (20 mg total) by mouth daily. 30 tablet 3 06/03/2017 at Unknown time  . finasteride (PROSCAR) 5 MG tablet Take 5 mg by mouth daily.   06/03/2017 at Unknown time  . hydrOXYzine (ATARAX/VISTARIL) 25 MG tablet Take 1 tablet (25 mg total) by mouth 3 (three) times daily as needed for anxiety. (Patient not taking: Reported on 06/03/2017) 30 tablet 3 Not Taking at  Unknown time    Treatment Modalities: Medication Management, Group therapy, Case management,  1 to 1 session with clinician, Psychoeducation, Recreational therapy.  Patient Stressors: Financial difficulties Medication change or noncompliance  Patient Strengths: Lawyer of knowledge Physical Health Supportive family/friends  Physician Treatment Plan for Primary Diagnosis: Schizoaffective disorder, bipolar type (HCC) Long Term Goal(s): Improvement in symptoms so as ready for discharge  Short Term Goals: Ability to identify changes in lifestyle to reduce recurrence of condition will improve Ability to verbalize feelings will improve Ability to demonstrate self-control will improve Ability to identify and develop effective coping behaviors will improve Compliance with prescribed medications will improve Ability to identify triggers associated with substance abuse/mental health issues will improve  Medication Management: Evaluate patient's response, side effects, and tolerance of medication regimen.  Therapeutic Interventions: 1 to 1 sessions, Unit Group sessions and Medication administration.  Evaluation of Outcomes: Progressing  Physician Treatment Plan for Secondary Diagnosis: Principal Problem:   Schizoaffective disorder, bipolar type (HCC)   Long Term Goal(s): Improvement in symptoms so as ready for discharge  Short Term Goals: Ability to identify changes in lifestyle to reduce recurrence of condition will improve Ability to verbalize feelings will improve Ability to demonstrate self-control will improve Ability to identify and develop effective coping behaviors will improve Compliance with prescribed medications will improve Ability to identify triggers associated with substance abuse/mental  health issues will improve  Medication Management: Evaluate patient's response, side effects, and tolerance of medication regimen.  Therapeutic Interventions: 1 to 1 sessions,  Unit Group sessions and Medication administration.  Evaluation of Outcomes: Progressing   RN Treatment Plan for Primary Diagnosis: Schizoaffective disorder, bipolar type (HCC) Long Term Goal(s): Knowledge of disease and therapeutic regimen to maintain health will improve  Short Term Goals: Ability to verbalize feelings will improve, Ability to disclose and discuss suicidal ideas and Ability to identify and develop effective coping behaviors will improve  Medication Management: RN will administer medications as ordered by provider, will assess and evaluate patient's response and provide education to patient for prescribed medication. RN will report any adverse and/or side effects to prescribing provider.  Therapeutic Interventions: 1 on 1 counseling sessions, Psychoeducation, Medication administration, Evaluate responses to treatment, Monitor vital signs and CBGs as ordered, Perform/monitor CIWA, COWS, AIMS and Fall Risk screenings as ordered, Perform wound care treatments as ordered.  Evaluation of Outcomes: Progressing   LCSW Treatment Plan for Primary Diagnosis: Schizoaffective disorder, bipolar type (HCC) Long Term Goal(s): Safe transition to appropriate next level of care at discharge, Engage patient in therapeutic group addressing interpersonal concerns.  Short Term Goals: Engage patient in aftercare planning with referrals and resources, Identify triggers associated with mental health/substance abuse issues and Increase skills for wellness and recovery  Therapeutic Interventions: Assess for all discharge needs, 1 to 1 time with Social worker, Explore available resources and support systems, Assess for adequacy in community support network, Educate family and significant other(s) on suicide prevention, Complete Psychosocial Assessment, Interpersonal group therapy.  Evaluation of Outcomes: Progressing   Progress in Treatment: Attending groups: No  Participating in groups: No Taking  medication as prescribed: Yes, MD continues to assess for medication changes as needed Toleration medication: Yes, no side effects reported at this time Family/Significant other contact made: Yes with father Patient understands diagnosis: Limited insight Discussing patient identified problems/goals with staff: Yes Medical problems stabilized or resolved: Yes Denies suicidal/homicidal ideation: Yes Issues/concerns per patient self-inventory: None Other: N/A  New problem(s) identified: None identified at this time.   New Short Term/Long Term Goal(s): None identified at this time.   Discharge Plan or Barriers:   Reason for Continuation of Hospitalization: Anxiety Depression Hallucinations Medication stabilization  Estimated Length of Stay: 2-4 days; Est DC date 7/6  Attendees: Patient: 06/10/2017  9:41 AM  Physician: Dr. Elna BreslowEappen 06/10/2017  9:41 AM  Nursing: Sherian MaroonBeverly, Elizabeth; RN 06/10/2017  9:41 AM  RN Care Manager: Onnie BoerJennifer Clark, RN 06/10/2017  9:41 AM  Social Worker: Vernie ShanksLauren Cyndee Giammarco, LCSW 06/10/2017  9:41 AM  Recreational Therapist: Marjette 06/10/2017  9:41 AM  Other: 06/10/2017  9:41 AM  Other:  06/10/2017  9:41 AM  Other: 06/10/2017  9:41 AM    Scribe for Treatment Team: Verdene LennertLauren C Angelgabriel Willmore, LCSW 06/10/2017 9:41 AM

## 2017-06-10 NOTE — Plan of Care (Signed)
Problem: Coping: Goal: Ability to verbalize frustrations and anger appropriately will improve Outcome: Progressing Nurse discussed anxiety/depression/coping skills with patient.

## 2017-06-10 NOTE — Progress Notes (Signed)
D:  Patient's self inventory sheet, patient sleeps good, no sleep medication given.  Good appetite, normal energy level, good concentration.  Denied depression, hopeless and anxiety.  Denied withdrawals.  Denied SI.  Denied physical problems.  Denied physical pain.  Goal is having a good day.  Plans to have a good day.  Does have discharge plans. A:  Medications administered per MD orders.  Emotional support and encouragement given patient. R:  Denied SI and HI, contracts for safety.   Denied A/V hallucinations.  Safety maintained with 15 minute checks.

## 2017-06-11 NOTE — Plan of Care (Signed)
Problem: Coping: Goal: Ability to verbalize frustrations and anger appropriately will improve Outcome: Progressing Nurse discussed anxiety/depression/coping skills with patient.    

## 2017-06-11 NOTE — Progress Notes (Signed)
Recreation Therapy Notes  Date: 06/11/17 Time: 1000 Location: [address] Day[address]  Group Topic: Leisure Education  Goal Area(s) Addresses:  Patient will identify positive leisure activities.  Patient will identify one positive benefit of participation in leisure activities.   Behavioral Response: Minimal  Intervention: Can with strips of paper with various words, dry erase marker, dry erase board, eraser  Activity: Pictionary.  Patients were select a slip of paper from the can.  The patient was to draw what was on the paper.  The remaining patients were to guess what the drawing was.  The patient drawing the picture could not talk or give clues.  The person who guessed the picture correctly would get the next opportunity.  Education:  Leisure Education, Building control surveyorDischarge Planning  Education Outcome: Acknowledges education/In group clarification offered/Needs additional education  Clinical Observations/Feedback: Pt did not participate in drawing any pictures even with encouragement from LRT and peers.  Pt did participate in guessing what the pictures were.  Pt was pleasant and bright throughout activity.   Caroll RancherMarjette Travarus Trudo, LRT/CTRS        Lillia AbedLindsay, Nathaneal Sommers A 06/11/2017 11:07 AM

## 2017-06-11 NOTE — [facility] Group Notes (Signed)
[facility] LCSW Group Therapy 06/11/2017 1:15 PM  Type of Therapy: Group Therapy- Emotion Regulation  Participation Level: Minimal  Participation Quality:  Attentive  Affect: Appropriate  Cognitive: Alert and Oriented   Insight:  Unable to assess  Engagement in Therapy: Minimal   Modes of Intervention: Clarification, Confrontation, Discussion, Education, Exploration, Limit-setting, Orientation, Problem-solving, Rapport Building, Dance movement psychotherapisteality Testing, Socialization and Support  Summary of Progress/Problems: The topic for group today was emotional regulation. This group focused on both positive and negative emotion identification and allowed group members to process ways to identify feelings, regulate negative emotions, and find healthy ways to manage internal/external emotions. Group members were asked to reflect on a time when their reaction to an emotion led to a negative outcome and explored how alternative responses using emotion regulation would have benefited them. Group members were also asked to discuss a time when emotion regulation was utilized when a negative emotion was experienced. Pt was attentive but did not participate in group discussion.    Vernie ShanksLauren Sascha Palma, LCSW 06/11/2017 11:03 AM

## 2017-06-11 NOTE — Progress Notes (Signed)
Nursing Progress Note [id number]  D) Patient presents with flat affect and anxious mood. Patient attended group this evening and was seen up in the milieu. Patient appeared to pace at times but reported he was "doing great" and denied further needs. Patient denies SI/HI/AVH or pain. Patient contracts for safety on the unit. Patient reports sleeping well.  A) Emotional support given. 1:1 interaction and active listening provided. No medications scheduled/requested this evening. Snacks and fluids provided. Opportunities for questions or concerns presented to patient. Patient encouraged to continue to work on treatment goals. Labs, vital signs and patient behavior monitored throughout shift. Patient safety maintained with q15 min safety checks. Low fall risk precautions in place and reviewed with patient; patient verbalized understanding.  R) Patient receptive to interaction with nurse. Patient remains safe on the unit at this time. Patient is resting in bed without complaints. Will continue to monitor.

## 2017-06-11 NOTE — Progress Notes (Signed)
Adult Psychoeducational Group Note  Date:  06/11/2017 Time:  8:43 PM  Group Topic/Focus:  Wrap-Up Group:   The focus of this group is to help patients review their daily goal of treatment and discuss progress on daily workbooks.  Participation Level:  Active  Participation Quality:  Appropriate  Affect:  Appropriate  Cognitive:  Appropriate  Insight: Appropriate  Engagement in Group:  Engaged  Modes of Intervention:  Discussion  Additional Comments: The patient expressed he had a great day.The patient also said that he day was great.  Octavio Mannshigpen, Alease Fait Lee 06/11/2017, 8:43 PM

## 2017-06-11 NOTE — Progress Notes (Signed)
[facility] MD Progress Note  06/11/2017 2:18 PM Brad Barr  MRN:  [id number] Subjective:  Pt states " I am fine.'     Objective: Patient seen and chart reviewed.Discussed patient with treatment team.  Pt is seen as more visible in milieu. He continues to deny any mood sx, reports anxiety due to being here. Has not been disruptive in milieu and is compliant on medications. Pt agrees to an ACTT since he has had multiple admissions this yr . CSW will work on the same.          Principal Problem: Schizoaffective disorder, bipolar type (HCC) Diagnosis:   Patient Active Problem List   Diagnosis Date Noted  . Involuntary commitment [Z04.6]   . Schizoaffective disorder, bipolar type (HCC) [F25.0] 06/04/2017  . 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 GAD (generalized anxiety disorder) [F41.1] 11/07/2015  . 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 ADHD (attention deficit hyperactivity disorder)   . Bipolar 1 disorder (HCC)   . Schizophrenia (HCC)    Pt requesting this be removed.    History reviewed. No pertinent surgical history. Family History:  Family History  Problem Relation Age of Onset  . Mental illness Other   . Mental illness Sister    Family Psychiatric  History: See H & P Social History:  History  Alcohol Use No     History  Drug Use No    Social History   Social History  . Marital status: Single    Spouse name: N/A  . Number of children: N/A  . Years of education: N/A   Social History Main Topics  . Smoking status: Former Games developer  . Smokeless tobacco: Former Neurosurgeon     Comment: Patient uses vapor.  . Alcohol use No  . Drug use: No  . Sexual activity: No   Other Topics Concern  . None   Social History Narrative  . None   Additional Social History:    Pain Medications: SEE MAR Prescriptions: SEE MAR Over the Counter: SEE MAR History of alcohol / drug use?: No history of alcohol / drug abuse Longest period of sobriety (when/how long): denies current usage                    Sleep: Fair  Appetite:  Fair  Current Medications: Current Facility-Administered Medications  Medication Dose Route Frequency Provider Last Rate Last Dose  . acetaminophen (TYLENOL) tablet 650 mg  650 mg Oral Q6H PRN Laveda Abbe, NP      . alum & mag hydroxide-simeth (MAALOX/MYLANTA) 200-200-20 MG/5ML suspension 30 mL  30 mL Oral Q4H PRN Laveda Abbe, NP      . carbamazepine (TEGRETOL) tablet 200 mg  200 mg Oral BID PC Laveda Abbe, NP   200 mg at 06/11/17 9562  . citalopram (CELEXA) tablet 10 mg  10 mg Oral Daily Cobos, Rockey Situ, MD   10 mg at 06/11/17 1308  . hydrOXYzine (ATARAX/VISTARIL) tablet 25 mg  25 mg Oral Q6H PRN Laveda Abbe, NP      . magnesium hydroxide (MILK OF MAGNESIA) suspension 30 mL  30 mL Oral Daily PRN  Laveda AbbeParks, Laurie Britton, NP      . nicotine polacrilex (NICORETTE) gum 2 mg  2 mg Oral PRN Jomarie LongsEappen, Yehia Mcbain, MD      . traZODone (DESYREL) tablet 50 mg  50 mg Oral QHS PRN Laveda AbbeParks, Laurie Britton, NP        Lab Results: No results found for this or any previous visit (from the past 48 hour(s)).  Blood Alcohol level:  Lab Results  Component Value Date   ETH <5 06/03/2017   ETH <5 12/15/2016    Metabolic Disorder Labs: Lab Results  Component Value Date   HGBA1C 5.3 10/18/2016   MPG 105 10/18/2016   MPG 111 11/07/2015   Lab Results  Component Value Date   PROLACTIN 35.3 (H) 10/18/2016   PROLACTIN 79.3 (H) 02/23/2016   Lab Results  Component Value Date   CHOL 206 (H) 10/18/2016   TRIG 96  10/18/2016   HDL 40 (L) 10/18/2016   CHOLHDL 5.2 10/18/2016   VLDL 19 10/18/2016   LDLCALC 147 (H) 10/18/2016   LDLCALC 116 (H) 11/07/2015    Physical Findings: AIMS: Facial and Oral Movements Muscles of Facial Expression: None, normal Lips and Perioral Area: None, normal Jaw: None, normal Tongue: None, normal,Extremity Movements Upper (arms, wrists, hands, fingers): None, normal Lower (legs, knees, ankles, toes): None, normal, Trunk Movements Neck, shoulders, hips: None, normal, Overall Severity Severity of abnormal movements (highest score from questions above): None, normal Incapacitation due to abnormal movements: None, normal Patient's awareness of abnormal movements (rate only patient's report): No Awareness, Dental Status Current problems with teeth and/or dentures?: No Does patient usually wear dentures?: No  CIWA:  CIWA-Ar Total: 1 COWS:  COWS Total Score: 1  Musculoskeletal: Strength & Muscle Tone: within normal limits Gait & Station: normal Patient leans: N/A  Psychiatric Specialty Exam: Physical Exam  Nursing note and vitals reviewed.   Review of Systems  Psychiatric/Behavioral: The patient is nervous/anxious.   All other systems reviewed and are negative.   Blood pressure 113/77, pulse 61, temperature 99 F (37.2 C), resp. rate (!) 21, height 6\' 3"  (1.905 m), weight 107 kg (236 lb).Body mass index is 29.5 kg/m.  General Appearance: Guarded  Eye Contact:  Fair  Speech:  Clear and Coherent  Volume:  Decreased  Mood:  anxious  Affect:  congruent  Thought Process:  Goal Directed and Descriptions of Associations: Circumstantial  Orientation:  Full (Time, Place, and Person)  Thought Content:  rumination  Suicidal Thoughts:  No  Homicidal Thoughts:  No  Memory:  Immediate;   Fair Recent;   Fair Remote;   Fair  Judgement:  Impaired  Insight:  Shallow  Psychomotor Activity:  Normal  Concentration:  Concentration: Fair and Attention Span: Fair  Recall:   FiservFair  Fund of Knowledge:  Fair  Language:  Fair  Akathisia:  No  Handed:  Right  AIMS (if indicated):     Assets:  Communication Skills Social Support  ADL's:  Intact  Cognition:  WNL  Sleep:  Number of Hours: 5.75   Will continue today 06/11/17 plan as below except where it is noted.  Treatment Plan Summary:Pt with hx of aggression and impulsivity , mood lability , comes in IVC ed for aggression and psychosis at home , however once admitted he is noted as calm , cooperative and has not had any disruptive issues on the unit , does have a hx of stimulant and possible BZD abuse. Unknown if his mood sx are better on the  unit since he is back on medications, he has a hx of being noncompliant with meds once discharged. Hence he will benefit from an ACTT .  If that can be arranged , he could possibly be discharged tomorrow , given he continues to improve.   Daily contact with patient to assess and evaluate symptoms and progress in treatment and Medication management   -Continue Celexa 10 mg daily for depression -Continue Tegretol 200 mg po bid for mood stabilization  Pt declined LAI. -Encourage to attend groups CSW to work on disposition.  Jerek Meulemans, MD 06/11/2017, 2:18 PM

## 2017-06-11 NOTE — Plan of Care (Signed)
Problem: Activity: Goal: Sleeping patterns will improve Outcome: Progressing Patient reports he is sleeping well and denied need for sleep medications.  Problem: Safety: Goal: Periods of time without injury will increase Outcome: Progressing Patient is on q15 minute safety checks and low fall risk precautions. Patient contracts for safety on the unit and remains safe at this time.

## 2017-06-12 NOTE — Progress Notes (Signed)
Recreation Therapy Notes  Date: 06/12/2017 Time: 10:00am Location: 500 [address] Dayroom  Group Topic: Leisure and Lifestyle Education  Goal Area(s) Addresses:  Patient will be able to successfully identify solutions to barriers they have experienced when they have tried to participate in leisure activities. Patient will be able to link the solutions to barriers they face in their everyday lives.  Behavioral Response: Engaged  Intervention: Scientist, clinical (histocompatibility and immunogenetics)Construction Paper, Scissors, Pencils  Activity: Pt will cut red construction paper into bricks and put one barrier they have faced while trying to participate in a leisure activity on 3-4 cut out bricks. Pt will then identify solutions to the barriers they have faced.  Education: Leisure and Lifestyle Education, Discharge Planning  Education Outcome: Acknowledges education  Clinical Observations/Feedback: Pt actively participated in opening group discussion by stating he enjoys listening to music and watching movies. Pt identified people, fear, work and school has barriers that he has faced while trying to participate in leisure activities. Pt helped other pts identify solutions to their barriers such as taking small steps to overcome anxiety or the fear of leaving the house.  Marvell Fullerachel Meyer, Recreation Therapy Intern  Caroll RancherMarjette Johnell Barr, LRT/CTRS

## 2017-06-12 NOTE — Plan of Care (Signed)
Problem: Safety: Goal: Periods of time without injury will increase Outcome: Progressing Patient is on q15 minute safety checks and low fall risk precautions. Patient contracts for safety on the unit and remains safe at this time.  Problem: Safety: Goal: Ability to redirect hostility and anger into socially appropriate behaviors will improve Outcome: Progressing Patient irritable with staff and demanding about needs this evening. Patient not aggressive or threatening.

## 2017-06-12 NOTE — Progress Notes (Signed)
DAR NOTE: Pt present with bright affect and jovial mood in the unit. Pt has been present in th milieu. Pt has been calm and cooperative, attended group and participated . Pt denies physical pain, took all his meds as scheduled. As per self inventory, pt had a good night sleep, good appetite, normal energy, and good concentration. Pt rate depression at 0, hopeless ness at 0, and anxiety at 0. Pt's safety ensured with 15 minute and environmental checks. Pt currently denies SI/HI and A/V hallucinations. Pt verbally agrees to seek staff if SI/HI or A/VH occurs and to consult with staff before acting on these thoughts. Will continue POC.

## 2017-06-12 NOTE — Progress Notes (Signed)
CSW left message for Brad Barr with [facility] to follow-up on referral for ACTT services. CSW requested return call.   Brad ShanksLauren Roxas Clymer, LCSW Clinical Social Work [phone]

## 2017-06-12 NOTE — Progress Notes (Signed)
[facility]  06/12/2017 3:14 PM Brad Barr  MRN:  [id number]  Subjective: Brad Barr reports, "I'm doing fine. I'm taking my medicines like I'm suppose to".  Objective: Patient seen and chart reviewed.Discussed patient with treatment team.  Pt is seen as more visible in milieu. He continues to deny any mood symptoms. Denies any anxiety symptoms today. Has not been disruptive on the unit, active in the group milieu and is compliant on medications. Pt agrees to an ACTT since he has had multiple admissions this yr. [facility], left a message. She is currently awaiting return call from the. At this point, Bethann BerkshireJohnny remains without any complaints. He appears to be in no apparent distress. He denies any SIHI, AVH, delusional thoughts or paranoia..  Principal Problem: Schizoaffective disorder, bipolar type (HCC) Diagnosis:   Patient Active Problem List   Diagnosis Date Noted  . Involuntary commitment [Z04.6]   . Schizoaffective disorder, bipolar type (HCC) [F25.0] 06/04/2017  . 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 GAD (generalized anxiety disorder) [F41.1] 11/07/2015  . 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 ADHD (attention deficit hyperactivity disorder)   . Bipolar 1 disorder (HCC)   . Schizophrenia (HCC)    Pt requesting this be removed.    History reviewed. No pertinent surgical history. Family History:  Family History  Problem Relation Age of Onset  . Mental illness Other   . Mental illness Sister    Family Psychiatric  History: See H &  P  Social History:  History  Alcohol Use No     History  Drug Use No    Social History   Social History  . Marital status: Single    Spouse name: N/A  . Number of children: N/A  . Years of education: N/A   Social History Main Topics  . Smoking status: Former Games developermoker  . Smokeless tobacco: Former NeurosurgeonUser     Comment: Patient uses vapor.  . Alcohol use No  . Drug use: No  . Sexual activity: No   Other Topics Concern  . None   Social History Narrative  . None   Additional Social History:    Pain Medications: SEE MAR Prescriptions: SEE MAR Over the Counter: SEE MAR History of alcohol / drug use?: No history of alcohol / drug abuse Longest period of sobriety (when/how long): denies current usage  Sleep: Fair  Appetite:  Fair  Current Medications: Current Facility-Administered Medications  Medication Dose Route Frequency Provider Last Rate Last Dose  . acetaminophen (TYLENOL) tablet 650 mg  650 mg Oral Q6H PRN Laveda AbbeParks, Laurie Britton, NP      . alum & mag hydroxide-simeth (MAALOX/MYLANTA) 200-200-20 MG/5ML suspension 30 mL  30 mL Oral Q4H PRN Laveda AbbeParks, Laurie Britton, NP      . carbamazepine (TEGRETOL) tablet 200 mg  200 mg Oral BID PC Laveda AbbeParks, Laurie Britton, NP   200 mg at 06/12/17 0816  . citalopram (CELEXA) tablet 10 mg  10 mg Oral Daily Cobos, Rockey SituFernando A, MD   10 mg at 06/12/17 0817  . hydrOXYzine (ATARAX/VISTARIL) tablet 25 mg  25 mg Oral Q6H PRN Arville CareParks,  Dorise Hiss, NP      . magnesium hydroxide (MILK OF MAGNESIA) suspension 30 mL  30 mL Oral Daily PRN Laveda Abbe, NP      . nicotine polacrilex (NICORETTE) gum 2 mg  2 mg Oral PRN Jomarie Longs, MD      . traZODone (DESYREL) tablet 50 mg  50 mg Oral QHS PRN Laveda Abbe, NP       Lab Results: No results found for this or any previous visit (from the past 48 hour(s)).  Blood Alcohol level:  Lab Results  Component Value Date   ETH <5 06/03/2017   ETH <5 12/15/2016    Metabolic Disorder  Labs: Lab Results  Component Value Date   HGBA1C 5.3 10/18/2016   MPG 105 10/18/2016   MPG 111 11/07/2015   Lab Results  Component Value Date   PROLACTIN 35.3 (H) 10/18/2016   PROLACTIN 79.3 (H) 02/23/2016   Lab Results  Component Value Date   CHOL 206 (H) 10/18/2016   TRIG 96 10/18/2016   HDL 40 (L) 10/18/2016   CHOLHDL 5.2 10/18/2016   VLDL 19 10/18/2016   LDLCALC 147 (H) 10/18/2016   LDLCALC 116 (H) 11/07/2015   Physical Findings: AIMS: Facial and Oral Movements Muscles of Facial Expression: None, normal Lips and Perioral Area: None, normal Jaw: None, normal Tongue: None, normal,Extremity Movements Upper (arms, wrists, hands, fingers): None, normal Lower (legs, knees, ankles, toes): None, normal, Trunk Movements Neck, shoulders, hips: None, normal, Overall Severity Severity of abnormal movements (highest score from questions above): None, normal Incapacitation due to abnormal movements: None, normal Patient's awareness of abnormal movements (rate only patient's report): No Awareness, Dental Status Current problems with teeth and/or dentures?: No Does patient usually wear dentures?: No  CIWA:  CIWA-Ar Total: 1 COWS:  COWS Total Score: 1  Musculoskeletal: Strength & Muscle Tone: within normal limits Gait & Station: normal Patient leans: N/A  Psychiatric Specialty Exam: Physical Exam  Nursing note and vitals reviewed.   Review of Systems  Psychiatric/Behavioral: The patient is nervous/anxious.   All other systems reviewed and are negative.   Blood pressure 130/82, pulse 68, temperature 99.1 F (37.3 C), resp. rate 20, height 6\' 3"  (1.905 m), weight 107 kg (236 lb).Body mass index is 29.5 kg/m.  General Appearance: Guarded  Eye Contact:  Fair  Speech:  Clear and Coherent  Volume:  Decreased  Mood:  anxious  Affect:  congruent  Thought Process:  Goal Directed and Descriptions of Associations: Circumstantial  Orientation:  Full (Time, Place, and Person)   Thought Content:  rumination  Suicidal Thoughts:  No  Homicidal Thoughts:  No  Memory:  Immediate;   Fair Recent;   Fair Remote;   Fair  Judgement:  Impaired  Insight:  Shallow  Psychomotor Activity:  Normal  Concentration:  Concentration: Fair and Attention Span: Fair  Recall:  [employer] of Knowledge:  Fair  Language:  Fair  Akathisia:  No  Handed:  Right  AIMS (if indicated):     Assets:  Communication Skills Social Support  ADL's:  Intact  Cognition:  WNL  Sleep:  Number of Hours: 6.25   Will continue today 06/12/17 plan as below except where it is noted.  Treatment Plan Summary: Pt with hx of aggression and impulsivity, mood lability, comes in IVC ed for aggression and psychosis at home, however once admitted he is noted as calm, cooperative and has not had any disruptive issues on the unit, does have a  hx of stimulant and possible BZD abuse. Unknown if his mood sx are better on the unit since he is back on medications, he has a hx of being noncompliant with meds once discharged. Hence he will benefit from an ACTT .  If that can be arranged, he could possibly be discharged tomorrow, given he continues to improve. 06-12-17: Social worker has a placed a phone call the [facility], left a message, awaiting return call.  Daily contact with patient to assess and evaluate symptoms and progress in treatment and Medication management   -Continue Celexa 10 mg daily for depression -Continue Tegretol 200 mg po bid for mood stabilization  Pt declined LAI. -Encourage to attend groups CSW to work on disposition.  Sanjuana Kava, NP, PMHNP, FNP-BC. 06/12/2017, 3:14 PM  Patient ID: Brad Key, male   DOB: June 05, 1976, 41 y.o.   MRN: [id number]

## 2017-06-12 NOTE — Progress Notes (Signed)
Patient refused lab draw this morning. Writer reviewed purpose of lab draw and importance of monitoring medication level for therapeutic effects with patient. Patient became irritable and agitated with Clinical research associatewriter. Patient stated "they can leave it alone, it's fine". Patient does not display evidence of understanding but does refuse lab draw. Writer encouraged patient to speak to provider today. Will reschedule order for later today.

## 2017-06-12 NOTE — [facility] Group Notes (Signed)
[facility] 06/12/2017 1:15pm  Type of Therapy: Mental Health Association Presentation  Pt did not attend, declined invitation.   Vernie ShanksLauren Demarkus Remmel, LCSW 06/12/2017 12:29 PM

## 2017-06-12 NOTE — Progress Notes (Signed)
Nursing Progress Note 1900-0730  D) Patient presents with flat affect and is minimal with Clinical research associatewriter. Patient is observed up in the milieu watching television but does not interact much with peers. Patient did not contribute to group. Patient observed by writer being irritable to MHT and demanding about snacks. Patient denies SI/HI/AVH or pain. Patient contracts for safety on the unit.  A)  Emotional support given. 1:1 interaction and active listening provided. No medications scheduled/requested this evening. Snacks and fluids provided. Opportunities for questions or concerns presented to patient. Patient encouraged to continue to work on treatment goals. Labs, vital signs and patient behavior monitored throughout shift. Patient safety maintained with q15 min safety checks. Low fall risk precautions in place and reviewed with patient; patient verbalized understanding.  R) Patient receptive to interaction with nurse. Patient remains safe on the unit at this time. Patient is resting in bed without complaints. Will continue to monitor.

## 2017-06-12 NOTE — Progress Notes (Signed)
Psychoeducational Group Note  Date:  06/12/2017 Time:  2053  Group Topic/Focus:  Wrap-Up Group:   The focus of this group is to help patients review their daily goal of treatment and discuss progress on daily workbooks.  Participation Level: Did Not Attend  Participation Quality:  Not Applicable  Affect:  Not Applicable  Cognitive:  Not Applicable  Insight:  Not Applicable  Engagement in Group: Not Applicable  Additional Comments:  The patient attended group but chose not to respond.   Hazle CocaGOODMAN, [name] 06/12/2017, 8:53 PM

## 2017-06-13 MED ORDER — HYDROXYZINE HCL 25 MG PO TABS: 25.0000 mg | ORAL_TABLET | Freq: Four times a day (QID) | ORAL | 0 refills | Status: AC | PRN

## 2017-06-13 MED ORDER — TRAZODONE HCL 50 MG PO TABS: 50.0000 mg | ORAL_TABLET | Freq: Every evening | ORAL | 0 refills | Status: AC | PRN

## 2017-06-13 MED ORDER — NICOTINE POLACRILEX 2 MG MT GUM: 2.0000 mg | CHEWING_GUM | OROMUCOSAL | 0 refills | Status: AC | PRN

## 2017-06-13 MED ORDER — CITALOPRAM HYDROBROMIDE 10 MG PO TABS: 10.0000 mg | ORAL_TABLET | Freq: Every day | ORAL | 0 refills | Status: AC

## 2017-06-13 MED ORDER — CARBAMAZEPINE 200 MG PO TABS: 200.0000 mg | ORAL_TABLET | Freq: Two times a day (BID) | ORAL | 0 refills | Status: AC

## 2017-06-13 NOTE — Progress Notes (Signed)
Recreation Therapy Notes  Date: 06/13/17 Time: 1000 Location: 500 Hall Dayroom  Group Topic: Communication, Team Building, Problem Solving  Goal Area(s) Addresses:  Patient will effectively work with peer towards shared goal.  Patient will identify skill used to make activity successful.  Patient will identify how skills used during activity can be used to reach post d/c goals.   Behavioral Response: Engaged  Intervention: STEM Activity   Activity: [place] [place] [place]. Patients were provided the following materials: 5 drinking straws, 5 rubber bands, 5 paper clips, 2 index cards, 2 drinking cups, and 2 toilet paper rolls. Using the provided materials patients were asked to build a launching mechanisms to launch a ping pong ball approximately 12 feet. Patients were divided into teams of 3-5.   Education: Pharmacist, communityocial Skills, Building control surveyorDischarge Planning.   Education Outcome: Acknowledges education/In group clarification offered/Needs additional education.   Clinical Observations/Feedback: Pt was more engaged and took an active role in trying to figure out how to build the launcher.  Pt stated they had to use trust to build the launcher.  When asked how trust helped complete activity, pt stated "we had to trust these flimsy cups".     Caroll RancherMarjette Areya Lemmerman, LRT/CTRS         Lillia AbedLindsay, Leita Lindbloom A 06/13/2017 11:45 AM

## 2017-06-13 NOTE — Progress Notes (Signed)
  [facility] :  Will you be returning to the same living situation after discharge:  Yes,  return home At discharge, do you have transportation home?: Yes,  bus pass given Do you have the ability to pay for your medications: Yes,  patient has scripts  Release of information consent forms completed and in the chart;  Patient's signature needed at discharge.  Patient to Follow up at: Follow-up Information    [facility] Follow up.   Specialty:  Professional Counselor Why:  Because you have not been here since last October, you will need to go to the walk-in clinic to have your chart re-opened for services.  Hours are M-F between 8 and 2:30. Go within 3 days of d/c from the hospital Contact information[facility] [address] [place] [address] [phone]           Next level of care provider has access to [facility] Link:yes  Safety Planning and Suicide Prevention discussed: Yes,  see note  Have you used any form of tobacco in the last 30 days? (Cigarettes, Smokeless Tobacco, Cigars, and/or Pipes): Yes  Has patient been referred to the Quitline?: N/A patient is not a smoker  Patient has been referred for addiction treatment: N/A  Raye SorrowCoble, Leonie Amacher N 06/13/2017, 2:31 PM

## 2017-06-13 NOTE — Progress Notes (Signed)
Pt discharged home in a bus pass. Pt was ambulatory, stable and appreciative at that time. All papers and prescriptions were given and valuables returned. Verbal understanding expressed. Denies SI/HI and A/VH. Pt given opportunity to express concerns and ask questions.

## 2017-06-13 NOTE — [facility] Suicide Risk Assessment (Addendum)
[facility] Discharge Suicide Risk Assessment   Principal Problem: Schizoaffective disorder, bipolar type [facility]) Discharge Diagnoses:  Patient Active Problem List   Diagnosis Date Noted  . Involuntary commitment [Z04.6]   . Schizoaffective disorder, bipolar type (HCC) [F25.0] 06/04/2017  . 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 GAD (generalized anxiety disorder) [F41.1] 11/07/2015  . 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 Ex  Patient reports that he was upset with his family, has been able to resolve his issues with his family as he lives with them. Patient adds that his mood is stable at this time, he states that he was depressed when he was admitted but is doing better. On a scale of 0-10 with 0 being no symptoms in 10 being the worst patient reports that his depression is now a 3 out of 10. He adds he no longer feels agitated, does not feel angry, is eating fine and sleeping well. He denies any psychotic symptoms, any thoughts of self, harm to others. He has that he feels he is good for discharge. He states that he plans to take his medications as prescribed, and plans to follow-up with his outpatient provider Total Time spent with patient: 30 minutes  Musculoskeletal: Strength & Muscle Tone: within normal limits Gait & Station: normal Patient leans: N/A  Psychiatric Specialty Exam: Review of Systems  Constitutional: Negative.  Negative for fever and malaise/fatigue.  HENT: Negative.  Negative for congestion and sore throat.   Eyes: Negative.  Negative for blurred vision, double vision, discharge and  redness.  Respiratory: Negative.  Negative for cough, shortness of breath and wheezing.   Cardiovascular: Negative.  Negative for chest pain and palpitations.  Gastrointestinal: Negative.  Negative for abdominal pain, heartburn, nausea and vomiting.  Genitourinary: Negative.  Negative for dysuria.  Musculoskeletal: Negative.  Negative for falls and myalgias.  Skin: Negative.  Negative for rash.  Neurological: Negative.  Negative for dizziness, seizures, loss of consciousness, weakness and headaches.  Endo/Heme/Allergies: Negative.  Negative for environmental allergies.  Psychiatric/Behavioral: Negative.  Negative for depression, hallucinations, memory loss, substance abuse and suicidal ideas. The patient is not nervous/anxious and does not have insomnia.     Blood pressure 121/82, pulse 70, temperature 98.2 F (36.8 C), temperature source Oral, resp. rate 16, height 6\' 3"  (1.905 m), weight 107 kg (236 lb).Body mass index is 29.5 kg/m.  General Appearance: Casual  Eye Contact::  Fair  Speech:  Clear and Coherent and Normal Rate409  Volume:  Normal  Mood:  Euthymic  Affect:  Congruent and Full Range  Thought Process:  Coherent, Goal Directed and Descriptions of Associations: Intact  Orientation:  Full (Time, Place, and Person)  Thought Content:  WDL  Suicidal Thoughts:  No  Homicidal Thoughts:  No  Memory:  Immediate;   Fair Recent;   Fair Remote;   Fair  Judgement:  Intact  Insight:  Present  Psychomotor Activity:  Normal  Concentration:  Fair  Recall:  [employer] of Knowledge:Fair  Language: Fair  Akathisia:  No  Handed:  Right  AIMS (if indicated):     Assets:  Communication Skills Desire for Improvement Housing Physical Health Social Support  Sleep:  Number of Hours: 6.25  Cognition: WNL  ADL's:  Intact   Mental Status Per Nursing Assessment::   On Admission:  NA  Demographic Factors:  Caucasian  Loss Factors: NA  Historical Factors: Impulsivity  Risk  Reduction Factors:   Living with another person, especially a relative  Continued Clinical Symptoms:  Alcohol/Substance Abuse/Dependencies Previous Psychiatric Diagnoses and Treatments  Cognitive Features That Contribute To Risk:  None    Suicide Risk:  Minimal: No identifiable suicidal ideation.  Patients presenting with no risk factors but with morbid ruminations; may be classified as minimal risk based on the severity of the depressive symptoms  Follow-up Information    [facility] Follow up.   Specialty:  Professional Counselor Why:  Because you have not been here since last October, you will need to go to the walk-in clinic to have your chart re-opened for services.  Hours are M-F between 8 and 2:30. Go within 3 days of d/c from the hospital Contact information: [facility] [address] [place] [address] [phone]           Plan Of Care/Follow-up recommendations:  Activity:  As tolerated Diet:  Regular Other:  Keep follow-up appointments and take medications as prescribed  Nelly RoutKUMAR,Lylith Bebeau, MD 06/13/2017, 1:57 PM

## 2017-06-13 NOTE — Discharge Summary (Signed)
Physician Discharge Summary Note  Patient:  Brad Barr is an 41 y.o., male  MRN:  [id number]  DOB:  1976/10/02  Patient phone:  [phone] (home)   Patient address:   [address] [place] [address],   Total Time spent with patient: Greater than 30 minutes  Date of Admission:  06/05/2017 Date of Discharge: 06-13-17  Reason for Admission: Non-compliant to medication & aggressive behavior.  Principal Problem: Schizoaffective disorder, bipolar type [facility])  Discharge Diagnoses: Patient Active Problem List   Diagnosis Date Noted  . Involuntary commitment [Z04.6]   . Schizoaffective disorder, bipolar type (HCC) [F25.0] 06/04/2017  . 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 GAD (generalized anxiety disorder) [F41.1] 11/07/2015  . 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 ADHD (attention deficit hyperactivity disorder)   . Bipolar 1 disorder (HCC)   . Schizophrenia (HCC)    Pt requesting this be removed.    History reviewed. No pertinent surgical history. Family History:  Family History  Problem Relation Age of Onset  . Mental illness Other   . Mental illness Sister    Family Psychiatric  History: See H&P  Social History:  History  Alcohol Use No     History  Drug Use No    Social History   Social History  . Marital status: Single    Spouse name: N/A  . Number of children: N/A  . Years of education: N/A   Social History Main Topics  . Smoking status: Former Games developer  . Smokeless tobacco: Former Neurosurgeon     Comment: Patient uses vapor.  . Alcohol use No  . Drug use: No  . Sexual activity: No   Other Topics Concern  . None   Social History  Narrative  . None   Hospital Course: Brad Barr is a 41 year old single male, no children, lives with family, currently unemployed. States " I had an argument with my family and they IVCd me". As per chart notes, patient was IVCd by parents , reporting patient became irate, rageful, threw groceries around the yard. At this time patient presents calm, pleasant, and minimizes episode. States "It was just an argument, and I did not throw any groceries, it was just that the grocery bag tore".  Patient states " I have been stable, I take my medications as I am supposed to, I am doing well".  Of note, in the past has been diagnosed with Substance Induced Mood Disorder, Stimulant and BZD Use Disorders. Patient states "I think all I really have is depression".  Patient denies any drug or alcohol abuse, and admission BAL is <5, UDS is negative. He has been admitted to our unit in the past (1/18) for similar circumstances (IVC generated by parents reporting aggression, agitation, anger).  At the time patient was discharged on Celexa and Tegretol. Denies side effects.    After the above admission assessment, Brad Barr was started on medication regimen for stabilization. Reports indicated that he was noncompliance with his medications prior to hospitalization. He received & was discharged on; Tegretol 200 mg for mood stabilization, Citalopram 20 mg for depression, Hydroxyzine 25 mg prn for anxiety, Nicorette gum 2 mg for smoking cessation & Trazodone 50 mg for insomnia. He was enrolled &  participated in the group sessions being offered & held on this unit. He learned coping skills. He presented no other significant health issues that required treatment & or monitoring. He tolerated his treatment regimen without any adverse effects or reactions reported.  Brad Barr's symptoms responded well to his treatment regimen. This is evidenced by his reports of improved mood, absence of suicidal/homicidal ideations or anger towards  anyone/family. During his discharge meeting with the attending psychiatrist, Verlene Mayer reports that he was upset with his family, has been able to resolve his issues with his family as he lives with them. Patient adds that his mood is stable at this time, he states that he was depressed when he was admitted but is doing better. On a scale of 0-10 with 0 being no symptoms & 10 being the worst patient reports that his depression is now a 3 out of 10. He adds he no longer feels agitated, does not feel angry, is eating well and sleeping well. He denies any psychotic symptoms, any thoughts of self, harm to others. He says that he feels he is good for discharge.  Brad Barr will continue routine mental health care & medication management as noted below. He is provided with all the necessary information needed to make this appointment without problems. He received a 7 days worth, supply samples of his [facility] discharge medications. He left [facility] with all personal belongings in no apparent distress. Transportation per family.  Physical Findings: AIMS: Facial and Oral Movements Muscles of Facial Expression: None, normal Lips and Perioral Area: None, normal Jaw: None, normal Tongue: None, normal,Extremity Movements Upper (arms, wrists, hands, fingers): None, normal Lower (legs, knees, ankles, toes): None, normal, Trunk Movements Neck, shoulders, hips: None, normal, Overall Severity Severity of abnormal movements (highest score from questions above): None, normal Incapacitation due to abnormal movements: None, normal Patient's awareness of abnormal movements (rate only patient's report): No Awareness, Dental Status Current problems with teeth and/or dentures?: No Does patient usually wear dentures?: No  CIWA:  CIWA-Ar Total: 1 COWS:  COWS Total Score: 1  Musculoskeletal: Strength & Muscle Tone: within normal limits Gait & Station: normal Patient leans: N/A  Psychiatric Specialty Exam: Physical Exam   Constitutional: He appears well-developed.  HENT:  Head: Normocephalic.  Eyes: Pupils are equal, round, and reactive to light.  Neck: Normal range of motion.  Cardiovascular: Normal rate.   Respiratory: Effort normal.  GI: Soft.  Genitourinary:  Genitourinary Comments: Deferred  Musculoskeletal: Normal range of motion.  Neurological: He is alert.  Skin: Skin is warm.    Review of Systems  Constitutional: Negative.   HENT: Negative.   Eyes: Negative.   Respiratory: Negative.   Cardiovascular: Negative.   Gastrointestinal: Negative.   Musculoskeletal: Negative.   Skin: Negative.   Neurological: Negative.   Endo/Heme/Allergies: Negative.   Psychiatric/Behavioral: Positive for depression (Stable). Negative for hallucinations, memory loss, substance abuse and suicidal ideas. The patient is nervous/anxious and has insomnia.   All other systems reviewed and are negative.   Blood pressure 121/82, pulse 70, temperature 98.2 F (36.8 C), temperature source Oral, resp. rate 16, height 6\' 3"  (1.905 m), weight 107 kg (236 lb).Body mass index is 29.5 kg/m.  SEE MD PSE WITHIN SRA  Have you used any form of tobacco in the last 30 days? (Cigarettes, Smokeless Tobacco, Cigars, and/or Pipes): Yes  Has this patient used any form of tobacco in the last 30 days?  (Cigarettes, Smokeless Tobacco, Cigars, and/or Pipes) No   Blood Alcohol level:  Lab Results  Component Value Date   ETH <5 06/03/2017   ETH <5 12/15/2016   Metabolic Disorder Labs:  Lab Results  Component Value Date   HGBA1C 5.3 10/18/2016   MPG 105 10/18/2016   MPG 111 11/07/2015   Lab Results  Component Value Date   PROLACTIN 35.3 (H) 10/18/2016   PROLACTIN 79.3 (H) 02/23/2016   Lab Results  Component Value Date   CHOL 206 (H) 10/18/2016   TRIG 96 10/18/2016   HDL 40 (L) 10/18/2016   CHOLHDL 5.2 10/18/2016   VLDL 19 10/18/2016   LDLCALC 147 (H) 10/18/2016   LDLCALC 116 (H) 11/07/2015   See Psychiatric Specialty  Exam and Suicide Risk Assessment completed by Attending Physician prior to discharge.  Discharge destination:  Home  Is patient on multiple antipsychotic therapies at discharge:  No   Has Patient had three or more failed trials of antipsychotic monotherapy by history:  No  Recommended Plan for Multiple Antipsychotic Therapies: NA   Allergies as of 06/13/2017   No Known Allergies     Medication List    STOP taking these medications   finasteride 5 MG tablet Commonly known as:  PROSCAR     TAKE these medications     Indication  carbamazepine 200 MG tablet Commonly known as:  TEGRETOL Take 1 tablet (200 mg total) by mouth 2 (two) times daily after a meal. For mood stabilization What changed:  additional instructions  Indication:  Mood stabilization   citalopram 10 MG tablet Commonly known as:  CELEXA Take 1 tablet (10 mg total) by mouth daily. For depression Start taking on:  06/14/2017 What changed:  medication strength  how much to take  additional instructions  Indication:  Depression   hydrOXYzine 25 MG tablet Commonly known as:  ATARAX/VISTARIL Take 1 tablet (25 mg total) by mouth every 6 (six) hours as needed for anxiety. What changed:  when to take this  Indication:  Anxiety Neurosis   nicotine polacrilex 2 MG gum Commonly known as:  NICORETTE Take 1 each (2 mg total) by mouth as needed for smoking cessation.  Indication:  Nicotine Addiction   traZODone 50 MG tablet Commonly known as:  DESYREL Take 1 tablet (50 mg total) by mouth at bedtime as needed for sleep.  Indication:  Trouble Sleeping      Follow-up Information    [facility] Follow up.   Specialty:  Professional Counselor Why:  Because you have not been here since last October, you will need to go to the walk-in clinic to have your chart re-opened for services.  Hours are M-F between 8 and 2:30. Go within 3 days of d/c from the hospital Contact information: [facility] [address] [place] [address] [phone]          Follow-up recommendations: Activity:  As tolerated Diet: As recommended by your primary care doctor. Keep all scheduled follow-up appointments as recommended.  Comments: Patient is instructed prior to discharge to: Take all medications as prescribed by his/her mental healthcare provider. Report any adverse effects and or reactions from the medicines to his/her outpatient provider promptly. Patient has been instructed & cautioned: To not engage in alcohol and or illegal drug use while on prescription medicines. In the event of worsening symptoms, patient is instructed to call the crisis hotline, 911 and or go to the nearest ED for appropriate evaluation and treatment of symptoms. To follow-up with his/her primary care  provider for your other medical issues, concerns and or health care needs.    Signed: Sanjuana Kava, NP, PMHNP, FNP-BC 06/13/2017, 12:48 PM

## 2017-06-13 NOTE — [facility] Group Notes (Signed)
Spiritual care group focused on theme of "care."   Participants worked to finish the sentence "care is" and engaged in facilitated dialog around theme.  Looked at Paramedicvisual explorer photos and picked a photo that represented the theme of care in their life.    Brad BerkshireJohnny was partially present for group, coming in and out of room.    Participation level - engaged when prompted by facilitator   Brad Barr picked a picture of a volt meter and described being drawn to the "good" side.  In conversation with facilitator, did not demonstrate insight into what he sees as good care in his life or what draws him toward that side.      [facility] / [facility] Chaplain Brad KingfisherMatthew Cori Barr, MDiv

## 2017-07-16 ENCOUNTER — Emergency Department (HOSPITAL_COMMUNITY)
Admission: EM | Admit: 2017-07-16 | Discharge: 2017-07-16 | Disposition: A | Discharge status: Home / Self Care | Payer: Self-pay | Attending: Emergency Medicine | Admitting: Emergency Medicine

## 2017-07-16 ENCOUNTER — Encounter (HOSPITAL_COMMUNITY): Payer: Self-pay | Admitting: Emergency Medicine

## 2017-07-16 DIAGNOSIS — Z5321 Procedure and treatment not carried out due to patient leaving prior to being seen by health care provider: Secondary | ICD-10-CM | POA: Insufficient documentation

## 2017-07-16 DIAGNOSIS — R51 Headache: Secondary | ICD-10-CM | POA: Insufficient documentation

## 2017-07-16 NOTE — ED Triage Notes (Signed)
Patient here for testicle swelling and headache that have been going on for months.

## 2017-07-16 NOTE — ED Notes (Signed)
Called patient to go back to room x 3 and no answer.

## 2017-08-27 IMAGING — CR DG CHEST 2V
3 series · 3 of 3 positions shown · non-contrast
Comparison: 10/15/2012

CLINICAL DATA: Heart palpitations and shortness of breath

EXAM:
CHEST  2 VIEW

[w chest pa]
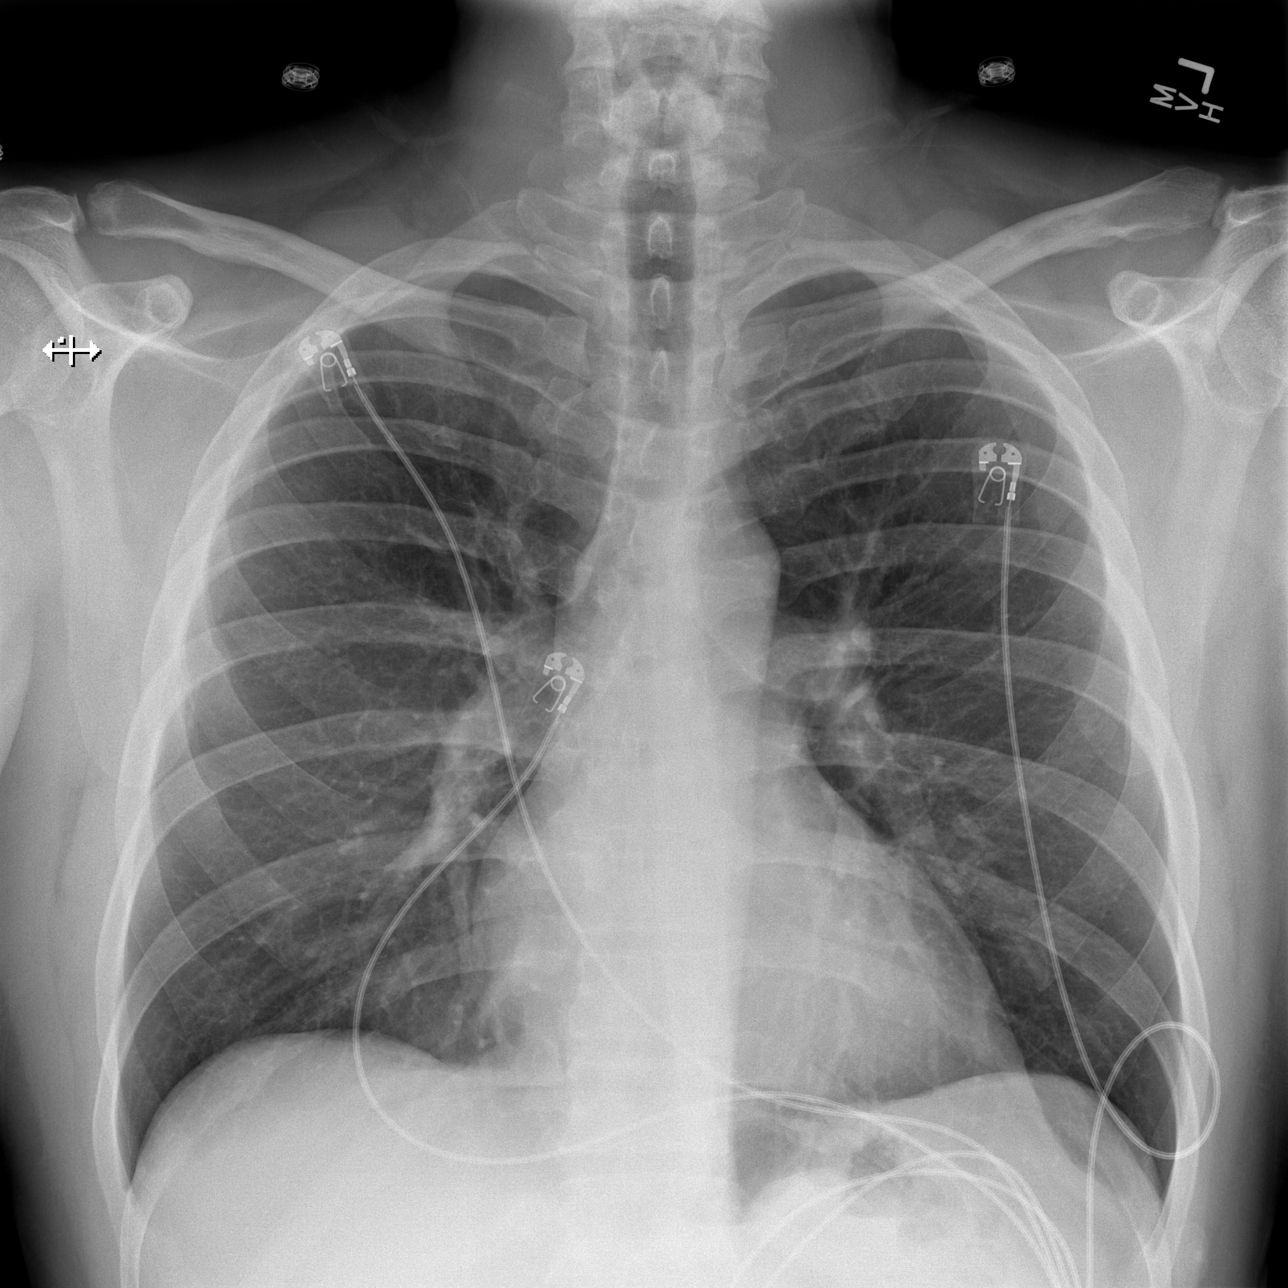

[w chest lat (1 of 2)]
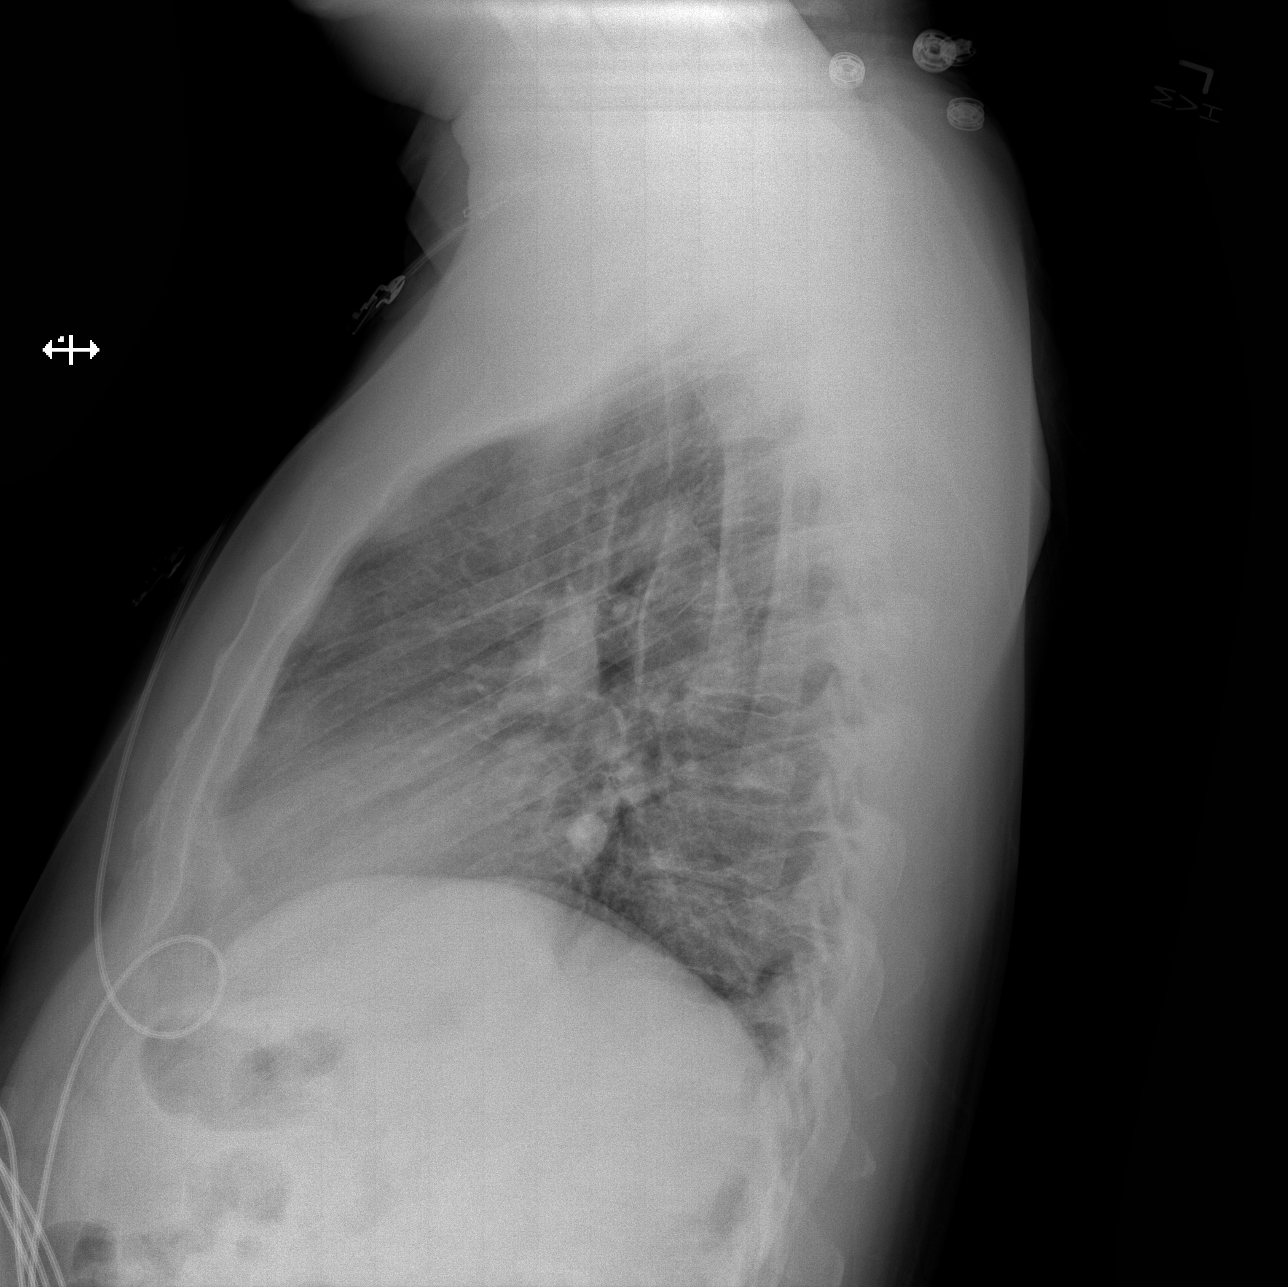

[w chest lat (2 of 2)]
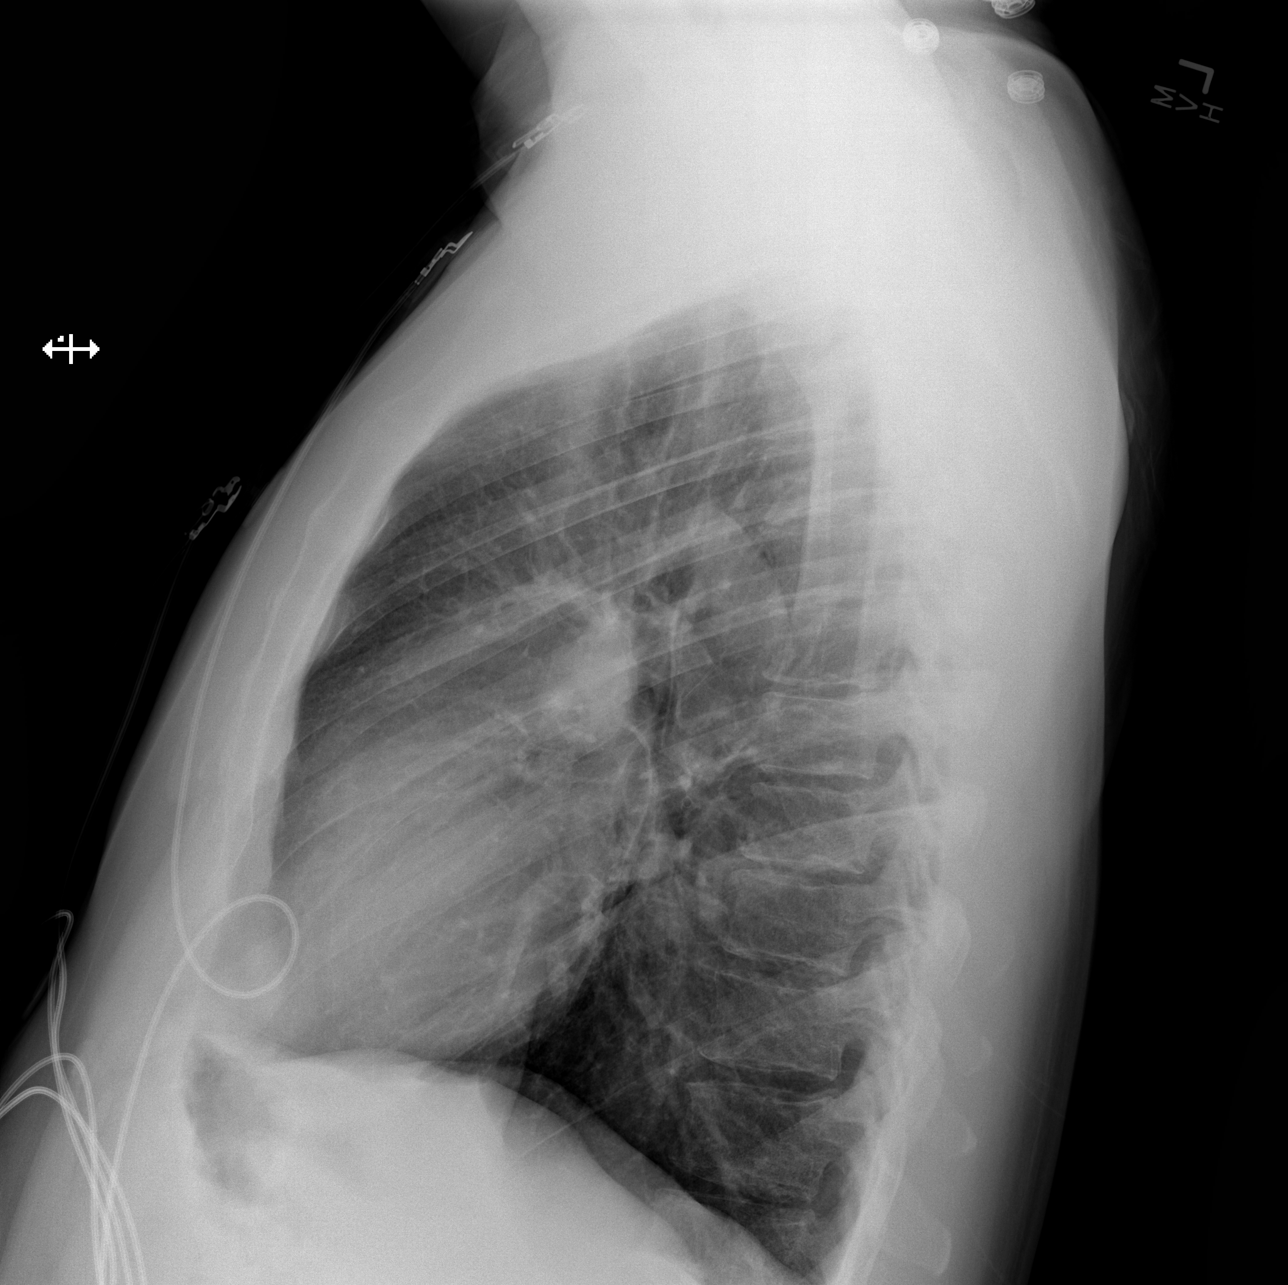

[3 of 3 positions shown; findings below may reference images not displayed]

FINDINGS: The heart size and mediastinal contours are within normal limits.
Both lungs are clear. The visualized skeletal structures are
unremarkable.
IMPRESSION: No active cardiopulmonary disease.

## 2021-04-04 ENCOUNTER — Ambulatory Visit: Payer: Medicaid Other | Attending: Nurse Practitioner | Admitting: Nurse Practitioner

## 2021-04-04 ENCOUNTER — Encounter: Payer: Self-pay | Admitting: Nurse Practitioner

## 2021-10-10 ENCOUNTER — Other Ambulatory Visit: Payer: Self-pay

## 2021-10-10 ENCOUNTER — Emergency Department (HOSPITAL_COMMUNITY)
Admission: EM | Admit: 2021-10-10 | Discharge: 2021-10-10 | Disposition: A | Discharge status: Home / Self Care | Payer: Medicaid Other | Attending: Emergency Medicine | Admitting: Emergency Medicine

## 2021-10-10 ENCOUNTER — Encounter (HOSPITAL_COMMUNITY): Payer: Self-pay

## 2021-10-10 DIAGNOSIS — H538 Other visual disturbances: Secondary | ICD-10-CM | POA: Insufficient documentation

## 2021-10-10 DIAGNOSIS — Z87891 Personal history of nicotine dependence: Secondary | ICD-10-CM | POA: Insufficient documentation

## 2021-10-10 MED ORDER — TETRACAINE HCL 0.5 % OP SOLN
2.0000 [drp] | Freq: Once | OPHTHALMIC | Status: AC
  Administered 2021-10-10: 2 [drp] via OPHTHALMIC
  Filled 2021-10-10: qty 4

## 2021-10-10 NOTE — ED Provider Notes (Signed)
Emergency Medicine Provider Triage Evaluation Note  Brad Barr , a 45 y.o. male  was evaluated in triage.  Pt complains of blurred vision of the last 3 to 4 days.  He denies any other injuries.  The father states that he battles with some mental health issues and tends to beat himself on the head throughout the day.  Patient denies any pain.  Review of Systems  Positive:  Negative: See above   Physical Exam  BP (!) 148/113 (BP Location: Left Arm)   Pulse 97   Temp 98.3 F (36.8 C) (Oral)   Resp 18   SpO2 100%  Gen:   Awake, no distress   Resp:  Normal effort  MSK:   Moves extremities without difficulty  Other:  Pupils are dilated to 6-7 mm bilaterally.  Pupils are poorly reactive to light.  Right cornea has moderate amount of haziness.  Left has a mild amount of haziness over the cornea.  Medical Decision Making  Medically screening exam initiated at 6:50 PM.  Appropriate orders placed.  Brad Barr was informed that the remainder of the evaluation will be completed by another provider, this initial triage assessment does not replace that evaluation, and the importance of remaining in the ED until their evaluation is complete.     Honor Loh McIntyre, PA-C 10/10/21 Lynelle Smoke    Linwood Dibbles, MD 10/12/21 [phone]

## 2021-10-10 NOTE — ED Notes (Signed)
Patient unable to read anything while attempting visual acuity screening. He states it is all blurry.

## 2021-10-10 NOTE — ED Notes (Signed)
Patient left before CBG could be collected and discharge process could be completed.

## 2021-10-10 NOTE — ED Triage Notes (Addendum)
Pt c/o blurry vision bilaterally x3 days. Pt states the right eye is more blurry and hazy. Pt states he is not diabetic. Pt denies falls/injury/trauma. Pt ambulatory, A&Ox4. Pt has no droopiness, no slurred speech, no other neuro deficits.

## 2021-10-10 NOTE — ED Provider Notes (Signed)
[facility] Provider Note   CSN: [id number] Arrival date & time: 10/10/21  1743     History Chief Complaint  Patient presents with   Blurred Vision    Brad Barr is a 45 y.o. male.  HPI  Patient presents to the ED for evaluation of blurred vision.  Patient states that started over the last few days.  Patient denies any acute injuries however additional history was provided by the father.  Patient does have history of mental health problems and will hit himself in the head throughout the day.  Patient denies any new medications.  Denies getting anything in his  Past Medical History:  Diagnosis Date   ADHD (attention deficit hyperactivity disorder)    Bipolar 1 disorder (HCC)    Schizophrenia (HCC)    Pt requesting this be removed.     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 No pertinent surgical history.     Family History  Problem Relation Age of Onset   Mental illness Other    Mental illness Sister     Social History   Tobacco Use   Smoking status: Former   Smokeless tobacco: Former   Tobacco comments:    Patient uses vapor.  Vaping Use   Vaping Use: Every day  Substance Use Topics   Alcohol use: No   Drug use: No    Home Medications Prior to Admission medications   Medication Sig Start Date End Date Taking? Authorizing Provider  carbamazepine (TEGRETOL) 200 MG tablet Take 1 tablet (200 mg total) by mouth 2 (two) times daily after a meal. For mood stabilization 06/13/17   Armandina Stammer I, NP  citalopram  (CELEXA) 10 MG tablet Take 1 tablet (10 mg total) by mouth daily. For depression 06/14/17   Armandina Stammer I, NP  hydrOXYzine (ATARAX/VISTARIL) 25 MG tablet Take 1 tablet (25 mg total) by mouth every 6 (six) hours as needed for anxiety. 06/13/17   Armandina Stammer I, NP  nicotine polacrilex (NICORETTE) 2 MG gum Take 1 each (2 mg total) by mouth as needed for smoking cessation. 06/13/17   Armandina Stammer I, NP  traZODone (DESYREL) 50 MG tablet Take 1 tablet (50 mg total) by mouth at bedtime as needed for sleep. 06/13/17   Sanjuana Kava, NP    Allergies    Patient has no known allergies.  Review of Systems   Review of Systems  All other systems reviewed and are negative.  Physical Exam Updated Vital Signs BP (!) 148/113 (BP Location: Left Arm)   Pulse 97   Temp 98.3 F (36.8 C) (Oral)   Resp 18   SpO2 100%   Physical Exam Vitals and nursing note reviewed.  Constitutional:      General: He is not in acute distress.    Appearance: He is well-developed.  HENT:     Head: Normocephalic and atraumatic.     Right Ear: External ear normal.     Left Ear: External ear normal.  Eyes:     General: No scleral icterus.       Right eye: No discharge.  Left eye: No discharge.     Conjunctiva/sclera: Conjunctivae normal.     Comments: Corneas Clear and bright, patient appears to have a cataract in the right eye>left eye, I am unable to see his fundus, left eye there is no hyphema but the lens appears more red, ? Partially seeing through the lens, ocular pressure 21 in the right and 11 in the left  Neck:     Trachea: No tracheal deviation.  Cardiovascular:     Rate and Rhythm: Normal rate.  Pulmonary:     Effort: Pulmonary effort is normal. No respiratory distress.     Breath sounds: No stridor.  Abdominal:     General: There is no distension.  Musculoskeletal:        General: No swelling or deformity.     Cervical back: Neck supple.  Skin:    General: Skin is warm and dry.     Findings: No  rash.  Neurological:     Mental Status: He is alert.     Cranial Nerves: Cranial nerve deficit: no gross deficits.    ED Results / Procedures / Treatments   Labs (all labs ordered are listed, but only abnormal results are displayed) Labs Reviewed  CBG MONITORING, ED     Procedures Procedures   Medications Ordered in ED Medications  tetracaine (PONTOCAINE) 0.5 % ophthalmic solution 2 drop (2 drops Both Eyes Given 10/10/21 1953)    ED Course  I have reviewed the triage vital signs and the nursing notes.  Pertinent labs & imaging results that were available during my care of the patient were reviewed by me and considered in my medical decision making (see chart for details).  Clinical Course as of 10/10/21 2026  Wed Oct 10, 2021  2024 Discussed case with Dr. Allena Katz.  He will see the patient in his office tomorrow at 8:30 in the morning [JK]    Clinical Course User Index [JK] Linwood Dibbles, MD   MDM Rules/Calculators/A&P                           Patient presents with blurred vision.  On exam he appears to have cataracts as I am unable to see his fundus.  There is no evidence of hyphema.  There is no significant trauma to the eye.  Ocular pressures are not significantly elevated.  Patient will need evaluation by an ophthalmologist.  Discussed case with Dr. Allena Katz and he will see the patient first thing in the morning Final Clinical Impression(s) / ED Diagnoses Final diagnoses:  Blurred vision    Rx / DC Orders ED Discharge Orders     None        Linwood Dibbles, MD 10/10/21 2026

## 2022-08-14 ENCOUNTER — Ambulatory Visit: Payer: Self-pay

## 2022-09-27 ENCOUNTER — Emergency Department (HOSPITAL_COMMUNITY)
Admission: EM | Admit: 2022-09-27 | Discharge: 2022-09-27 | Disposition: A | Discharge status: Home / Self Care | Payer: Medicaid Other | Attending: Physician Assistant | Admitting: Physician Assistant

## 2022-09-27 ENCOUNTER — Encounter (HOSPITAL_COMMUNITY): Payer: Self-pay | Admitting: Emergency Medicine

## 2022-09-27 ENCOUNTER — Other Ambulatory Visit: Payer: Self-pay

## 2022-09-27 DIAGNOSIS — T161XXA Foreign body in right ear, initial encounter: Secondary | ICD-10-CM | POA: Diagnosis present

## 2022-09-27 DIAGNOSIS — X58XXXA Exposure to other specified factors, initial encounter: Secondary | ICD-10-CM | POA: Insufficient documentation

## 2022-09-27 MED ORDER — OFLOXACIN 0.3 % OT SOLN: 10.0000 [drp] | Freq: Two times a day (BID) | OTIC | 0 refills | Status: AC

## 2022-09-27 NOTE — Discharge Instructions (Addendum)
Please take the drops as indicated.  Call [facility]NT tomorrow and schedule an appointment for removal of the foreign body.

## 2022-09-27 NOTE — ED Triage Notes (Signed)
Patient reports a piece of cotton is dislodged and stuck at right ear while using a cue tip last month .

## 2022-09-27 NOTE — ED Provider Notes (Signed)
[facility] EMERGENCY DEPARTMENT Provider Note   CSN: [id number] Arrival date & time: 09/27/22  0142     History  Chief Complaint  Patient presents with   Cotton in right ear    [name] is a 46 y.o. male.  HPI 46 year old male presenting to the ER with concerns for foreign body in his right ear x2 months.  Patient states that he has a cotton ball stuck in his ear which has been there for about 2 months.  He reports a foul odor.  Has been having ear pain.  Denies any hearing loss.  No drainage. Reports family urged him to come be evaluated     Home Medications Prior to Admission medications   Medication Sig Start Date End Date Taking? Authorizing Provider  ofloxacin (FLOXIN) 0.3 % OTIC solution Place 10 drops into the right ear 2 (two) times daily for 10 days. 09/27/22 10/07/22 Yes Garald Balding, PA-C  carbamazepine (TEGRETOL) 200 MG tablet Take 1 tablet (200 mg total) by mouth 2 (two) times daily after a meal. For mood stabilization 06/13/17   Lindell Spar I, NP  citalopram (CELEXA) 10 MG tablet Take 1 tablet (10 mg total) by mouth daily. For depression 06/14/17   Lindell Spar I, NP  hydrOXYzine (ATARAX/VISTARIL) 25 MG tablet Take 1 tablet (25 mg total) by mouth every 6 (six) hours as needed for anxiety. 06/13/17   Lindell Spar I, NP  nicotine polacrilex (NICORETTE) 2 MG gum Take 1 each (2 mg total) by mouth as needed for smoking cessation. 06/13/17   Lindell Spar I, NP  traZODone (DESYREL) 50 MG tablet Take 1 tablet (50 mg total) by mouth at bedtime as needed for sleep. 06/13/17   Encarnacion Slates, NP      Allergies    Patient has no known allergies.    Review of Systems   Review of Systems  Physical Exam Updated Vital Signs BP (!) 130/99 (BP Location: Right Arm)   Pulse (!) 111   Temp 98.5 F (36.9 C) (Oral)   Resp 18   SpO2 99%  Physical Exam Vitals and nursing note reviewed.  Constitutional:      General: He is not in acute distress.    Appearance:  He is well-developed.  HENT:     Head: Normocephalic and atraumatic.     Left Ear: Tympanic membrane normal.     Ears:     Comments: Right ear canal swollen, with erythema and tragal tenderness.  Difficult to evaluate given swelling in ear canal, I do see evidence of a yellow/green foreign body versus infected TM.  Attempted to remove with alligator forceps but difficult given canal narrowing. Eyes:     Conjunctiva/sclera: Conjunctivae normal.  Cardiovascular:     Rate and Rhythm: Normal rate and regular rhythm.     Heart sounds: No murmur heard. Pulmonary:     Effort: Pulmonary effort is normal. No respiratory distress.     Breath sounds: Normal breath sounds.  Abdominal:     Palpations: Abdomen is soft.     Tenderness: There is no abdominal tenderness.  Musculoskeletal:        General: No swelling.     Cervical back: Neck supple.  Skin:    General: Skin is warm and dry.     Capillary Refill: Capillary refill takes less than 2 seconds.  Neurological:     Mental Status: He is alert.  Psychiatric:        Mood  and Affect: Mood normal.     ED Results / Procedures / Treatments   Labs (all labs ordered are listed, but only abnormal results are displayed) Labs Reviewed - No data to display  EKG None  Radiology No results found.  Procedures Procedures    Medications Ordered in ED Medications - No data to display  ED Course/ Medical Decision Making/ A&P                           Medical Decision Making Risk Prescription drug management.   46 year old male with foreign body in ear x2 months.  Difficult to assess given canal swelling.  Attempted to remove foreign body with alligator forceps but unable to give and swelling.  He has evidence of otitis externa.  We will give ofloxacin drops and will refer to ENT for removal.  We discussed return precautions.  He voiced understanding and is agreeable.  Stable for discharge. Final Clinical Impression(s) / ED Diagnoses Final  diagnoses:  Foreign body of right ear, initial encounter    Rx / DC Orders ED Discharge Orders          Ordered    ofloxacin (FLOXIN) 0.3 % OTIC solution  2 times daily        09/27/22 0251              Mare Ferrari, PA-C 09/27/22 0258    Tilden Fossa, MD 09/27/22 [phone]
